# Patient Record
Sex: Female | Born: 1935 | Race: White | Hispanic: No | Marital: Married | Smoking: Former smoker
Health system: Southern US, Community
[De-identification: ages and names within clinical notes are randomized; demographics above are authoritative.]

## PROBLEM LIST (undated history)

## (undated) DIAGNOSIS — M1990 Unspecified osteoarthritis, unspecified site: Secondary | ICD-10-CM

## (undated) DIAGNOSIS — R51 Headache: Secondary | ICD-10-CM

## (undated) DIAGNOSIS — C189 Malignant neoplasm of colon, unspecified: Secondary | ICD-10-CM

## (undated) DIAGNOSIS — Z789 Other specified health status: Secondary | ICD-10-CM

## (undated) DIAGNOSIS — I10 Essential (primary) hypertension: Secondary | ICD-10-CM

## (undated) DIAGNOSIS — I6529 Occlusion and stenosis of unspecified carotid artery: Secondary | ICD-10-CM

## (undated) HISTORY — DX: Malignant neoplasm of colon, unspecified: C18.9

## (undated) HISTORY — PX: APPENDECTOMY: SHX54

## (undated) HISTORY — DX: Occlusion and stenosis of unspecified carotid artery: I65.29

## (undated) HISTORY — PX: FRACTURE SURGERY: SHX138

## (undated) HISTORY — DX: Essential (primary) hypertension: I10

## (undated) HISTORY — PX: TONSILLECTOMY: SUR1361

## (undated) HISTORY — PX: ABDOMINAL HYSTERECTOMY: SHX81

---

## 2005-05-20 HISTORY — PX: INTRAOCULAR LENS IMPLANT, SECONDARY: SHX1842

## 2013-08-25 ENCOUNTER — Emergency Department (HOSPITAL_COMMUNITY): Payer: Medicare Other

## 2013-08-25 ENCOUNTER — Encounter (HOSPITAL_COMMUNITY): Payer: Self-pay | Admitting: Emergency Medicine

## 2013-08-25 ENCOUNTER — Inpatient Hospital Stay (HOSPITAL_COMMUNITY)
Admission: EM | Admit: 2013-08-25 | Discharge: 2013-08-31 | DRG: 330 | Disposition: A | Discharge status: Home / Self Care | Payer: Medicare Other | Attending: General Surgery | Admitting: General Surgery

## 2013-08-25 DIAGNOSIS — D649 Anemia, unspecified: Secondary | ICD-10-CM | POA: Diagnosis not present

## 2013-08-25 DIAGNOSIS — R109 Unspecified abdominal pain: Secondary | ICD-10-CM | POA: Diagnosis not present

## 2013-08-25 DIAGNOSIS — K6389 Other specified diseases of intestine: Secondary | ICD-10-CM

## 2013-08-25 DIAGNOSIS — N390 Urinary tract infection, site not specified: Secondary | ICD-10-CM | POA: Diagnosis present

## 2013-08-25 DIAGNOSIS — C19 Malignant neoplasm of rectosigmoid junction: Secondary | ICD-10-CM | POA: Diagnosis not present

## 2013-08-25 DIAGNOSIS — D126 Benign neoplasm of colon, unspecified: Secondary | ICD-10-CM | POA: Diagnosis present

## 2013-08-25 DIAGNOSIS — R000 Tachycardia, unspecified: Secondary | ICD-10-CM | POA: Diagnosis present

## 2013-08-25 DIAGNOSIS — K56609 Unspecified intestinal obstruction, unspecified as to partial versus complete obstruction: Secondary | ICD-10-CM | POA: Diagnosis not present

## 2013-08-25 DIAGNOSIS — E860 Dehydration: Secondary | ICD-10-CM | POA: Diagnosis not present

## 2013-08-25 DIAGNOSIS — D490 Neoplasm of unspecified behavior of digestive system: Secondary | ICD-10-CM | POA: Diagnosis not present

## 2013-08-25 DIAGNOSIS — K639 Disease of intestine, unspecified: Secondary | ICD-10-CM | POA: Diagnosis present

## 2013-08-25 DIAGNOSIS — R1084 Generalized abdominal pain: Secondary | ICD-10-CM | POA: Diagnosis not present

## 2013-08-25 DIAGNOSIS — K5730 Diverticulosis of large intestine without perforation or abscess without bleeding: Secondary | ICD-10-CM | POA: Diagnosis present

## 2013-08-25 DIAGNOSIS — R222 Localized swelling, mass and lump, trunk: Secondary | ICD-10-CM | POA: Diagnosis not present

## 2013-08-25 HISTORY — DX: Other specified health status: Z78.9

## 2013-08-25 HISTORY — DX: Headache: R51

## 2013-08-25 HISTORY — DX: Unspecified osteoarthritis, unspecified site: M19.90

## 2013-08-25 LAB — COMPREHENSIVE METABOLIC PANEL
ALK PHOS: 112 U/L (ref 39–117)
ALT: 11 U/L (ref 0–35)
AST: 19 U/L (ref 0–37)
Albumin: 2.4 g/dL — ABNORMAL LOW (ref 3.5–5.2)
BILIRUBIN TOTAL: 0.3 mg/dL (ref 0.3–1.2)
BUN: 21 mg/dL (ref 6–23)
CO2: 28 meq/L (ref 19–32)
Calcium: 8.7 mg/dL (ref 8.4–10.5)
Chloride: 103 mEq/L (ref 96–112)
Creatinine, Ser: 0.99 mg/dL (ref 0.50–1.10)
GFR calc non Af Amer: 53 mL/min — ABNORMAL LOW (ref >90)
GFR, EST AFRICAN AMERICAN: 61 mL/min — AB (ref >90)
GLUCOSE: 120 mg/dL — AB (ref 70–99)
POTASSIUM: 4.9 meq/L (ref 3.7–5.3)
Sodium: 140 mEq/L (ref 137–147)
Total Protein: 7.1 g/dL (ref 6.0–8.3)

## 2013-08-25 LAB — PROTIME-INR
INR: 1 (ref 0.00–1.49)
PROTHROMBIN TIME: 13 s (ref 11.6–15.2)

## 2013-08-25 LAB — URINALYSIS, ROUTINE W REFLEX MICROSCOPIC
Glucose, UA: NEGATIVE mg/dL
Hgb urine dipstick: NEGATIVE
Leukocytes, UA: NEGATIVE
NITRITE: POSITIVE — AB
Protein, ur: 30 mg/dL — AB
Specific Gravity, Urine: >1.03 — ABNORMAL HIGH (ref 1.005–1.030)
Urobilinogen, UA: 1 mg/dL (ref 0.0–1.0)
pH: 6 (ref 5.0–8.0)

## 2013-08-25 LAB — CBC WITH DIFFERENTIAL/PLATELET
Basophils Absolute: 0 10*3/uL (ref 0.0–0.1)
Basophils Relative: 0 % (ref 0–1)
Eosinophils Absolute: 0 10*3/uL (ref 0.0–0.7)
Eosinophils Relative: 0 % (ref 0–5)
HCT: 35.1 % — ABNORMAL LOW (ref 36.0–46.0)
HEMOGLOBIN: 11.2 g/dL — AB (ref 12.0–15.0)
LYMPHS ABS: 1.3 10*3/uL (ref 0.7–4.0)
LYMPHS PCT: 16 % (ref 12–46)
MCH: 27.9 pg (ref 26.0–34.0)
MCHC: 31.9 g/dL (ref 30.0–36.0)
MCV: 87.3 fL (ref 78.0–100.0)
MONOS PCT: 10 % (ref 3–12)
Monocytes Absolute: 0.8 10*3/uL (ref 0.1–1.0)
NEUTROS ABS: 6.1 10*3/uL (ref 1.7–7.7)
NEUTROS PCT: 74 % (ref 43–77)
Platelets: 392 10*3/uL (ref 150–400)
RBC: 4.02 MIL/uL (ref 3.87–5.11)
RDW: 15.8 % — ABNORMAL HIGH (ref 11.5–15.5)
WBC: 8.1 10*3/uL (ref 4.0–10.5)

## 2013-08-25 LAB — URINE MICROSCOPIC-ADD ON

## 2013-08-25 LAB — LIPASE, BLOOD: Lipase: 18 U/L (ref 11–59)

## 2013-08-25 MED ORDER — ONDANSETRON HCL 4 MG/2ML IJ SOLN
4.0000 mg | Freq: Once | INTRAMUSCULAR | Status: AC
  Administered 2013-08-25: 4 mg via INTRAVENOUS
  Filled 2013-08-25: qty 2

## 2013-08-25 MED ORDER — ACETAMINOPHEN 325 MG PO TABS
650.0000 mg | ORAL_TABLET | Freq: Four times a day (QID) | ORAL | Status: DC | PRN
  Administered 2013-08-29 – 2013-08-30 (×3): 650 mg via ORAL
  Filled 2013-08-25 (×4): qty 2

## 2013-08-25 MED ORDER — SODIUM CHLORIDE 0.9 % IJ SOLN
INTRAMUSCULAR | Status: AC
  Filled 2013-08-25: qty 600

## 2013-08-25 MED ORDER — IOHEXOL 300 MG/ML  SOLN
50.0000 mL | Freq: Once | INTRAMUSCULAR | Status: AC | PRN
  Administered 2013-08-25: 50 mL via ORAL

## 2013-08-25 MED ORDER — HYDROMORPHONE HCL PF 1 MG/ML IJ SOLN: 1.0000 mg | INTRAMUSCULAR | Status: DC | PRN

## 2013-08-25 MED ORDER — ONDANSETRON HCL 4 MG/2ML IJ SOLN
4.0000 mg | Freq: Four times a day (QID) | INTRAMUSCULAR | Status: DC | PRN
  Administered 2013-08-26 – 2013-08-27 (×2): 4 mg via INTRAVENOUS
  Filled 2013-08-25 (×2): qty 2

## 2013-08-25 MED ORDER — SODIUM CHLORIDE 0.9 % IV SOLN: INTRAVENOUS | Status: DC

## 2013-08-25 MED ORDER — MORPHINE SULFATE 2 MG/ML IJ SOLN
2.0000 mg | Freq: Once | INTRAMUSCULAR | Status: AC
  Administered 2013-08-25: 2 mg via INTRAVENOUS
  Filled 2013-08-25: qty 1

## 2013-08-25 MED ORDER — ALUM & MAG HYDROXIDE-SIMETH 200-200-20 MG/5ML PO SUSP: 30.0000 mL | Freq: Four times a day (QID) | ORAL | Status: DC | PRN

## 2013-08-25 MED ORDER — SODIUM CHLORIDE 0.9 % IV SOLN
INTRAVENOUS | Status: AC
  Administered 2013-08-25: 14:00:00 via INTRAVENOUS

## 2013-08-25 MED ORDER — ACETAMINOPHEN 650 MG RE SUPP: 650.0000 mg | Freq: Four times a day (QID) | RECTAL | Status: DC | PRN

## 2013-08-25 MED ORDER — ONDANSETRON HCL 4 MG/2ML IJ SOLN: 4.0000 mg | Freq: Three times a day (TID) | INTRAMUSCULAR | Status: DC | PRN

## 2013-08-25 MED ORDER — PEG 3350-KCL-NA BICARB-NACL 420 G PO SOLR
ORAL | Status: AC
  Filled 2013-08-25: qty 4000

## 2013-08-25 MED ORDER — IOHEXOL 300 MG/ML  SOLN
100.0000 mL | Freq: Once | INTRAMUSCULAR | Status: AC | PRN
  Administered 2013-08-25: 100 mL via INTRAVENOUS

## 2013-08-25 MED ORDER — ENOXAPARIN SODIUM 40 MG/0.4ML ~~LOC~~ SOLN
40.0000 mg | SUBCUTANEOUS | Status: DC
  Administered 2013-08-25 – 2013-08-26 (×2): 40 mg via SUBCUTANEOUS
  Filled 2013-08-25 (×2): qty 0.4

## 2013-08-25 MED ORDER — PEG 3350-KCL-NABCB-NACL-NASULF 236 G PO SOLR
4000.0000 mL | Freq: Once | ORAL | Status: AC
  Administered 2013-08-26: 4000 mL via ORAL
  Filled 2013-08-25: qty 4000

## 2013-08-25 MED ORDER — MORPHINE SULFATE 2 MG/ML IJ SOLN
1.0000 mg | INTRAMUSCULAR | Status: DC | PRN
  Administered 2013-08-25 – 2013-08-26 (×2): 1 mg via INTRAVENOUS
  Filled 2013-08-25 (×2): qty 1

## 2013-08-25 MED ORDER — HYDROCODONE-ACETAMINOPHEN 5-325 MG PO TABS
1.0000 | ORAL_TABLET | ORAL | Status: DC | PRN
  Administered 2013-08-25: 1 via ORAL
  Administered 2013-08-26: 2 via ORAL
  Filled 2013-08-25: qty 1
  Filled 2013-08-25: qty 2

## 2013-08-25 MED ORDER — ONDANSETRON HCL 4 MG PO TABS: 4.0000 mg | ORAL_TABLET | Freq: Four times a day (QID) | ORAL | Status: DC | PRN

## 2013-08-25 NOTE — ED Notes (Signed)
Report given to [facility] unit 300.

## 2013-08-25 NOTE — H&P (Signed)
Patient seen and examined. Above note reviewed.  She is admitted to the hospital with intermittent diarrhea, nausea and vomiting. CT scan of the abdomen and pelvis indicates apocrine lesion in the colon with dilation of proximal colon and small bowel. The patient is moving her bowels. She's not having any vomiting at this time. She will undergo colonoscopy tomorrow with gastroenterology. She will also likely need a surgical consult. Inpatient versus outpatient oncology consult pending biopsy results  [facility]

## 2013-08-25 NOTE — ED Provider Notes (Signed)
CSN: [id number]     Arrival date & time 08/25/13  0932 History   First MD Initiated Contact with Patient 08/25/13 1009     Chief Complaint  Patient presents with  . Abdominal Pain     (Consider location/radiation/quality/duration/timing/severity/associated sxs/prior Treatment) Patient is a 80 y.o. female presenting with abdominal pain. The history is provided by the patient.  Abdominal Pain Pain location:  Generalized Pain quality: cramping   Pain radiates to:  Does not radiate Pain severity:  Severe Onset quality:  Sudden Duration:  12 hours Timing:  Constant Progression:  Worsening Chronicity:  New Context: not diet changes, not previous surgeries, not recent illness, not recent travel, not sick contacts and not suspicious food intake   Relieved by:  Nothing Worsened by:  Movement, position changes and eating Ineffective treatments:  None tried Associated symptoms: anorexia, chills, diarrhea, nausea and vomiting   Associated symptoms: no chest pain, no constipation, no cough, no dysuria and no fever    [name] [name] is a 80 y.o. female with history of hysterectomy and appendectomy who presents to the ED with generalized abdominal pain that started about 10:30 am yesterday. She describes the pain as severe and feels like when she had labor pains. She reports having had 3 similar episodes since December but not this bad. She did not want to come today but her husband insisted.  She had vomiting that started yesterday the same time as the pain started.she has had uncontrollable diarrhea this am. She does not have a PCP. The last time she saw a doctor was about 8 years ago. She has no medical problems. The patient's husband is with her and her daughter that works here in the ICU.   History reviewed. No pertinent past medical history. Past Surgical History  Procedure Laterality Date  . Abdominal hysterectomy    . Appendectomy     No family history on file. History  Substance Use Topics   . Smoking status: Never Smoker   . Smokeless tobacco: Not on file  . Alcohol Use: No   OB History   Grav Para Term Preterm Abortions TAB SAB Ect Mult Living                 Review of Systems  Constitutional: Positive for chills. Negative for fever.  HENT: Negative.   Eyes: Negative for pain and redness.  Respiratory: Negative for cough.   Cardiovascular: Negative for chest pain.  Gastrointestinal: Positive for nausea, vomiting, abdominal pain, diarrhea and anorexia. Negative for constipation.  Genitourinary: Negative for dysuria and urgency.  Skin: Negative for rash.  Neurological: Negative for light-headedness and headaches.  Psychiatric/Behavioral: Negative for confusion. The patient is not nervous/anxious.       Allergies  Review of patient's allergies indicates no known allergies.  Home Medications   Current Outpatient Rx  Name  Route  Sig  Dispense  Refill  . Aspirin-Salicylamide-Caffeine (BC HEADACHE POWDER PO)   Oral   Take 1 Package by mouth daily as needed (headache).         Vladimir Faster Glycol-Propyl Glycol (SYSTANE OP)   Ophthalmic   Apply 1 drop to eye 2 (two) times daily as needed (allergies).          BP 148/56  Pulse 104  Temp(Src) 97.6 F (36.4 C) (Oral)  Resp 20  Ht 5\' 3"  (1.6 m)  Wt 140 lb (63.504 kg)  BMI 24.81 kg/m2  SpO2 100% Physical Exam  Nursing note and vitals reviewed. Constitutional:  She is oriented to person, place, and time. She appears well-developed and well-nourished. No distress.  HENT:  Head: Normocephalic and atraumatic.  Eyes: Conjunctivae and EOM are normal.  Neck: Normal range of motion. Neck supple.  Cardiovascular: Regular rhythm.  Tachycardia present.   Pulmonary/Chest: Effort normal.  Abdominal: Soft. Bowel sounds are normal. There is generalized tenderness. There is no rebound and no guarding.  Musculoskeletal: Normal range of motion.  Neurological: She is alert and oriented to person, place, and time. No  cranial nerve deficit.  Skin: Skin is warm and dry.  Psychiatric: She has a normal mood and affect. Her behavior is normal.     Results for orders placed during the hospital encounter of 08/25/13 (from the past 24 hour(s))  CBC WITH DIFFERENTIAL     Status: Abnormal   Collection Time    08/25/13 10:12 AM      Result Value Ref Range   WBC 8.1  4.0 - 10.5 K/uL   RBC 4.02  3.87 - 5.11 MIL/uL   Hemoglobin 11.2 (*) 12.0 - 15.0 g/dL   HCT 35.1 (*) 36.0 - 46.0 %   MCV 87.3  78.0 - 100.0 fL   MCH 27.9  26.0 - 34.0 pg   MCHC 31.9  30.0 - 36.0 g/dL   RDW 15.8 (*) 11.5 - 15.5 %   Platelets 392  150 - 400 K/uL   Neutrophils Relative % 74  43 - 77 %   Neutro Abs 6.1  1.7 - 7.7 K/uL   Lymphocytes Relative 16  12 - 46 %   Lymphs Abs 1.3  0.7 - 4.0 K/uL   Monocytes Relative 10  3 - 12 %   Monocytes Absolute 0.8  0.1 - 1.0 K/uL   Eosinophils Relative 0  0 - 5 %   Eosinophils Absolute 0.0  0.0 - 0.7 K/uL   Basophils Relative 0  0 - 1 %   Basophils Absolute 0.0  0.0 - 0.1 K/uL  COMPREHENSIVE METABOLIC PANEL     Status: Abnormal   Collection Time    08/25/13 10:59 AM      Result Value Ref Range   Sodium 140  137 - 147 mEq/L   Potassium 4.9  3.7 - 5.3 mEq/L   Chloride 103  96 - 112 mEq/L   CO2 28  19 - 32 mEq/L   Glucose, Bld 120 (*) 70 - 99 mg/dL   BUN 21  6 - 23 mg/dL   Creatinine, Ser 0.99  0.50 - 1.10 mg/dL   Calcium 8.7  8.4 - 10.5 mg/dL   Total Protein 7.1  6.0 - 8.3 g/dL   Albumin 2.4 (*) 3.5 - 5.2 g/dL   AST 19  0 - 37 U/L   ALT 11  0 - 35 U/L   Alkaline Phosphatase 112  39 - 117 U/L   Total Bilirubin 0.3  0.3 - 1.2 mg/dL   GFR calc non Af Amer 53 (*) >90 mL/min   GFR calc Af Amer 61 (*) >90 mL/min  LIPASE, BLOOD     Status: None   Collection Time    08/25/13 10:59 AM      Result Value Ref Range   Lipase 18  11 - 59 U/L  URINALYSIS, ROUTINE W REFLEX MICROSCOPIC     Status: Abnormal   Collection Time    08/25/13 12:12 PM      Result Value Ref Range   Color, Urine YELLOW   YELLOW   APPearance  HAZY (*) CLEAR   Specific Gravity, Urine >1.030 (*) 1.005 - 1.030   pH 6.0  5.0 - 8.0   Glucose, UA NEGATIVE  NEGATIVE mg/dL   Hgb urine dipstick NEGATIVE  NEGATIVE   Bilirubin Urine SMALL (*) NEGATIVE   Ketones, ur TRACE (*) NEGATIVE mg/dL   Protein, ur 30 (*) NEGATIVE mg/dL   Urobilinogen, UA 1.0  0.0 - 1.0 mg/dL   Nitrite POSITIVE (*) NEGATIVE   Leukocytes, UA NEGATIVE  NEGATIVE  URINE MICROSCOPIC-ADD ON     Status: Abnormal   Collection Time    08/25/13 12:12 PM      Result Value Ref Range   Squamous Epithelial / LPF FEW (*) RARE   WBC, UA 3-6  <3 WBC/hpf   RBC / HPF 3-6  <3 RBC/hpf   Bacteria, UA MANY (*) RARE   Casts HYALINE CASTS (*) NEGATIVE   Ct Abdomen Pelvis W Contrast  08/25/2013   CLINICAL DATA:  Generalized abdominal pain  EXAM: CT ABDOMEN AND PELVIS WITH CONTRAST  TECHNIQUE: Multidetector CT imaging of the abdomen and pelvis was performed using the standard protocol following bolus administration of intravenous contrast.  CONTRAST:  42mL OMNIPAQUE IOHEXOL 300 MG/ML SOLN, 159mL OMNIPAQUE IOHEXOL 300 MG/ML SOLN  COMPARISON:  None.  FINDINGS: Lung bases are unremarkable. Mild dextroscoliosis lower thoracic and lumbar spine. Degenerative changes lumbar spine.  Enhanced liver is unremarkable. No calcified gallstones are noted within gallbladder.  The pancreas spleen and adrenal glands are unremarkable.  Kidneys are symmetrical in size and enhancement. Multiple large parapelvic cysts are noted right kidney the largest in midpole measures 3.1 cm. Parapelvic cysts are noted within left kidney the largest in lower pole measures 1.9 cm.  Delayed renal images shows bilateral renal symmetrical excretion. Bilateral visualized proximal ureter is unremarkable. Atherosclerotic calcifications of abdominal aorta and iliac arteries.  Mild distended distal small bowel loops with fluid. No any contrast material noted in distal small bowel loops or within colon. Findings highly  suspicious for partial small bowel obstruction or significant ileus.  There is distension of the proximal right colon with fluid. In axial image 43 there is apple core lesion in right colon inferior to hepatic flexure. This is highly suspicious for a malignant stricture. This is best visualized in coronal image 35. Further correlation with colonoscopy is recommended.  The terminal ileum is unremarkable.  The patient is status post appendectomy. Trace fluid is noted just inferior to the cecum see coronal image 30.  Sigmoid colon diverticula are noted without evidence of acute diverticulitis PE  The transverse colon, left colon and sigmoid colon are smaller caliber.  Atherosclerotic calcifications of abdominal aorta and iliac arteries. Atherosclerotic calcifications bilateral renal artery origin. No aortic aneurysm.  IMPRESSION: 1. There is apple core lesion in right colon measures about 2.6 x 2.2 cm. This is highly suspicious for malignant colonic stricture. Further evaluation with colonoscopy is recommended. Mild distended of right colon proximal to the stricture. There is distension of distal small bowel loops with fluid. Findings suspicious for significant ileus or partial small bowel obstruction. 2. No ascites or free air. 3. Atherosclerotic calcifications of abdominal aorta and iliac arteries. Atherosclerotic calcifications are noted bilateral renal artery origin. 4. No hydronephrosis or hydroureter. Bilateral large parapelvic cysts. These results were called by telephone at the time of interpretation on 08/25/2013 at 12:48 PM to Dr. Stark Jock , who verbally acknowledged these results.   Electronically Signed   By: Lahoma Crocker M.D.   On: 08/25/2013  12:50    ED Course  Procedures  IV normal saline, Zofran, Morphine MDM: Dr. Stark Jock spoke with the Hospital[facility] and he will admit.    80 y.o. female with nausea, vomiting and severe abdominal pain x 24 hours.  Will admit for further evaluation of the bowel lesion and  obstruction. Patient also has UTI.     [facility] Bunnie Pion, [place] 08/25/13 1356

## 2013-08-25 NOTE — ED Notes (Signed)
Generalized abdominal pain with vomiting began at 1030 yesterday morning. Diarrhea began this morning.

## 2013-08-25 NOTE — H&P (Signed)
[facility] History and Physical  Robby Pirani JOA:[id number] DOB: November 02, 1931 DOA: 08/25/2013  Referring physician:  PCP: No PCP Per Patient   Chief Complaint: Abdominal  HPI: [name] [name] is a very pleasant 79 y.o. female with no past medical history presents to the emergency department with the chief complaint of abdominal pain. Information is obtained from the patient. She reports intermittent abdominal pain for the last 4 months. She reports approximately 5 "episodes" in a four-month period. She indicates each episode lasted 3-4 days. Yesterday morning she developed sudden severe pain that started in her lower abdomen and radiated to to her left upper quadrant. She describes the pain as "like labor pains" associated symptoms include some diarrhea, nausea with vomiting. She denies any coffee ground emesis. He also experiences some abdominal distention. She indicates that she has suffered from constipation over the last several weeks as well. She tried to take Mylanta and prune juice with out relief. She indicates that when she eats or drinks the abdominal pain worsens. She states that  when she has an episode she just stops eating and drinking and that he would ultimately stopped. His workup in the emergency department yields a CT of the abdomen and pelvis that reveals an apple core lesion in her right: Suspicious for malignancy as well as ileus versus small bowel obstruction. In the emergency department she was given morphine with good relief. She is hemodynamically stable and not hypoxic. We are asked to admit  Review of Systems: 10 point review of systems completed and all systems are negative except as indicated in the history of present illness History reviewed. No pertinent past medical history. Past Surgical History  Procedure Laterality Date  . Abdominal hysterectomy    . Appendectomy     Social History:  reports that she has never smoked. She does not have any smokeless tobacco  history on file. She reports that she does not drink alcohol. Her drug history is not on file. 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 He does not have a PCP and has not gone to the doctor in many years No Known Allergies  No family history on file.   Prior to Admission medications   Medication Sig Start Date End Date Taking? Authorizing Provider  Aspirin-Salicylamide-Caffeine (BC HEADACHE POWDER PO) Take 1 Package by mouth daily as needed (headache).   Yes Historical Provider, MD  Polyethyl Glycol-Propyl Glycol (SYSTANE OP) Apply 1 drop to eye 2 (two) times daily as needed (allergies).   Yes Historical Provider, MD   Physical Exam: Filed Vitals:   08/25/13 1358  BP: 126/63  Pulse: 99  Temp: 97.9 F (36.6 C)  Resp: 18    BP 126/63  Pulse 99  Temp(Src) 97.9 F (36.6 C) (Oral)  Resp 18  Ht 5\' 3"  (1.6 m)  Wt 63.504 kg (140 lb)  BMI 24.81 kg/m2  SpO2 94%  General:  Appears calm and comfortable Eyes: PERRL, normal lids, irises & conjunctiva ENT: grossly normal hearing, lips & tongue Neck: no LAD, masses or thyromegaly Cardiovascular: RRR, no m/r/g. No LE edema. Telemetry: SR, no arrhythmias  Respiratory: CTA bilaterally, no w/r/r. Normal respiratory effort. Abdomen: Slightly distended but soft positive bowel sounds but sluggish mild tenderness to palpation in lower quadrants. Mild to moderate tenderness in the mid left quadrant. No guarding Skin: no rash or induration seen on limited exam Musculoskeletal: grossly normal tone BUE/BLE  Psychiatric: grossly normal mood and affect, speech fluent and appropriate Neurologic: grossly non-focal.          Labs on Admission:  Basic Metabolic Panel:  Recent Labs Lab 08/25/13 1059  NA 140  K 4.9  CL 103  CO2 28  GLUCOSE 120*  BUN 21  CREATININE 0.99  CALCIUM 8.7   Liver Function Tests:  Recent Labs Lab 08/25/13 1059  AST  19  ALT 11  ALKPHOS 112  BILITOT 0.3  PROT 7.1  ALBUMIN 2.4*    Recent Labs Lab 08/25/13 1059  LIPASE 18   No results found for this basename: AMMONIA,  in the last 168 hours CBC:  Recent Labs Lab 08/25/13 1012  WBC 8.1  NEUTROABS 6.1  HGB 11.2*  HCT 35.1*  MCV 87.3  PLT 392   Cardiac Enzymes: No results found for this basename: CKTOTAL, CKMB, CKMBINDEX, TROPONINI,  in the last 168 hours  BNP (last 3 results) No results found for this basename: PROBNP,  in the last 8760 hours CBG: No results found for this basename: GLUCAP,  in the last 168 hours  Radiological Exams on Admission: Ct Abdomen Pelvis W Contrast  08/25/2013   CLINICAL DATA:  Generalized abdominal pain  EXAM: CT ABDOMEN AND PELVIS WITH CONTRAST  TECHNIQUE: Multidetector CT imaging of the abdomen and pelvis was performed using the standard protocol following bolus administration of intravenous contrast.  CONTRAST:  2mL OMNIPAQUE IOHEXOL 300 MG/ML SOLN, 131mL OMNIPAQUE IOHEXOL 300 MG/ML SOLN  COMPARISON:  None.  FINDINGS: Lung bases are unremarkable. Mild dextroscoliosis lower thoracic and lumbar spine. Degenerative changes lumbar spine.  Enhanced liver is unremarkable. No calcified gallstones are noted within gallbladder.  The pancreas spleen and adrenal glands are unremarkable.  Kidneys are symmetrical in size and enhancement. Multiple large parapelvic cysts are noted right kidney the largest in midpole measures 3.1 cm. Parapelvic cysts are noted within left kidney the largest in lower pole measures 1.9 cm.  Delayed renal images shows bilateral renal symmetrical excretion. Bilateral visualized proximal ureter is unremarkable. Atherosclerotic calcifications of abdominal aorta and iliac arteries.  Mild distended distal small bowel loops with fluid. No any contrast material noted in distal small bowel loops or within colon. Findings highly suspicious for partial small bowel obstruction or significant ileus.  There is  distension of the proximal right colon with fluid. In axial image 43 there is apple core lesion in right colon inferior to hepatic flexure. This is highly suspicious for a malignant stricture. This is best visualized in coronal image 35. Further correlation with colonoscopy is recommended.  The terminal ileum is unremarkable.  The patient is status post appendectomy. Trace fluid is noted just inferior to the cecum see coronal image 30.  Sigmoid colon diverticula are noted without evidence of acute diverticulitis PE  The transverse colon, left colon and sigmoid colon are smaller caliber.  Atherosclerotic calcifications of abdominal aorta and iliac arteries. Atherosclerotic calcifications bilateral renal artery origin. No aortic aneurysm.  IMPRESSION: 1. There is apple core lesion in right colon measures about 2.6 x 2.2 cm. This is highly suspicious for malignant colonic stricture. Further evaluation with colonoscopy is recommended. Mild distended of right colon proximal to the stricture. There is distension of distal small bowel loops with fluid. Findings suspicious for significant ileus or partial small bowel obstruction. 2. No ascites or free air. 3. Atherosclerotic calcifications of abdominal aorta and iliac arteries. Atherosclerotic calcifications are noted bilateral renal artery origin. 4. No hydronephrosis or hydroureter. Bilateral  large parapelvic cysts. These results were called by telephone at the time of interpretation on 08/25/2013 at 12:48 PM to Dr. Stark Jock , who verbally acknowledged these results.   Electronically Signed   By: Lahoma Crocker M.D.   On: 08/25/2013 12:50    EKG:   Assessment/Plan Principal Problem:   Small bowel obstruction: CT scan with distention of small bowel loop suspicious for ileus versus partial small bowel obstruction. Likely related to lesion. Will admit to medical floor. Will provide clear liquids for now. Also provide pain medicine meds and Zofran for any nausea. Will request a  surgical consult.  Active Problems:  Lesion of colon: CT with apple core lesion right colon. Concerning for malignancy. For request GI consult as patient will likely need colonoscopy for further evaluation. Provide clear liquids pain medicine and antimanic as indicated.    Tachycardia: Likely related to abdominal pain and mild dehydration as patient has had nothing to eat or drink in the last couple of days. Some improvement with pain medicine. Will also provide gentle IV fluids. Monitor closely     Anemia: Mild. Will monitor  Abdominal pain: Do to #1 and possibly #2. See treatments as above. Pain medicine as indicate  Nausea/vomiting. Patient reports no appetite at this time period but requesting clear liquids. Has not had by mouth intake over the last 2 days. Will provide antimanic as indicated. Of note she was able to tolerate 2 bottles of contrast for her CT without vomiting. Continue Zofran  Gastroenterology General   Code Status: full Family Communication: Husband and daughter at bedside. Of note daughter is an Therapist, sports in the ICU here at the hospital Disposition Plan: home when ready  Time spent: 60 minutes  [facility] Pager [phone]

## 2013-08-25 NOTE — ED Provider Notes (Signed)
Medical screening examination/treatment/procedure(s) were conducted as a shared visit with non-physician practitioner(s) and myself.  I personally evaluated the patient during the encounter. Patient is a 80 year old female with no significant past medical history. She presents today with complaints of intermittent abdominal pain that has occurred over the past 3 months. She developed this abdominal pain again along with vomiting this morning and presents for evaluation of this. She has not seen a doctor in 8 years and states that she has not had a colonoscopy in nearly 40 years.  On exam vitals are stable and she is afebrile. Head is atraumatic normocephalic. Neck is supple. Heart regular rate and rhythm and lungs are clear. Abdomen reveals tenderness to palpation in the right upper and left upper quadrants. There is no rebound or guarding. Bowel sounds are normal active. Extremities are without edema.  Workup reveals no elevation of white count and electrolytes and lipase are unremarkable. Due to the degree of her discomfort, a CT scan of the abdomen and pelvis was obtained which revealed what appears to be an apple core lesion in the right colon with proximal obstruction. She will be admitted to the internal medicine service will consult both general surgery and gastroenterology to discuss the appropriate further management.     Veryl Speak, MD 08/25/13 [phone]

## 2013-08-25 NOTE — Consult Note (Signed)
Reason for Consult:abdominal pain Referring Physician: Hospitalist  [name] [name] is an 80 y.o. female.  HPI: Admitted thru the ED today with c/o abdominal pain. She had had abdominal pain x 1 day. Rated the pain 10/10. She has had 2 prior instances of this abdominal pain in December and January.    She does have  new complaint of constipation. Sometimes now she will have a BM every 3 days. Previously she would have a BM every day. Today she has had 3 loose stools. She denies melena or bright red rectal bleeding. No family hx of colon cancer. Appetite is good. No weight loss.  Only medication is BC  Powder for occasional headache.  NO PCP for over 40 yrs. She reports she had a colonoscopy 40 yrs ago    History reviewed. No pertinent past medical history.  Past Surgical History  Procedure Laterality Date  . Abdominal hysterectomy    . Appendectomy      No family history on file.  Social History:  reports that she has never smoked. She does not have any smokeless tobacco history on file. She reports that she does not drink alcohol. Her drug history is not on file.  Allergies: No Known Allergies  Medications: I have reviewed the patient's current medications.  Results for orders placed during the hospital encounter of 08/25/13 (from the past 48 hour(s))  CBC WITH DIFFERENTIAL     Status: Abnormal   Collection Time    08/25/13 10:12 AM      Result Value Ref Range   WBC 8.1  4.0 - 10.5 K/uL   RBC 4.02  3.87 - 5.11 MIL/uL   Hemoglobin 11.2 (*) 12.0 - 15.0 g/dL   HCT 35.1 (*) 36.0 - 46.0 %   MCV 87.3  78.0 - 100.0 fL   MCH 27.9  26.0 - 34.0 pg   MCHC 31.9  30.0 - 36.0 g/dL   RDW 15.8 (*) 11.5 - 15.5 %   Platelets 392  150 - 400 K/uL   Neutrophils Relative % 74  43 - 77 %   Neutro Abs 6.1  1.7 - 7.7 K/uL   Lymphocytes Relative 16  12 - 46 %   Lymphs Abs 1.3  0.7 - 4.0 K/uL   Monocytes Relative 10  3 - 12 %   Monocytes Absolute 0.8  0.1 - 1.0 K/uL   Eosinophils Relative 0  0 - 5  %   Eosinophils Absolute 0.0  0.0 - 0.7 K/uL   Basophils Relative 0  0 - 1 %   Basophils Absolute 0.0  0.0 - 0.1 K/uL  COMPREHENSIVE METABOLIC PANEL     Status: Abnormal   Collection Time    08/25/13 10:59 AM      Result Value Ref Range   Sodium 140  137 - 147 mEq/L   Potassium 4.9  3.7 - 5.3 mEq/L   Chloride 103  96 - 112 mEq/L   CO2 28  19 - 32 mEq/L   Glucose, Bld 120 (*) 70 - 99 mg/dL   BUN 21  6 - 23 mg/dL   Creatinine, Ser 0.99  0.50 - 1.10 mg/dL   Calcium 8.7  8.4 - 10.5 mg/dL   Total Protein 7.1  6.0 - 8.3 g/dL   Albumin 2.4 (*) 3.5 - 5.2 g/dL   AST 19  0 - 37 U/L   ALT 11  0 - 35 U/L   Alkaline Phosphatase 112  39 - 117 U/L  Total Bilirubin 0.3  0.3 - 1.2 mg/dL   GFR calc non Af Amer 53 (*) >90 mL/min   GFR calc Af Amer 61 (*) >90 mL/min   Comment: (NOTE)     The eGFR has been calculated using the CKD EPI equation.     This calculation has not been validated in all clinical situations.     eGFR's persistently <90 mL/min signify possible Chronic Kidney     Disease.  LIPASE, BLOOD     Status: None   Collection Time    08/25/13 10:59 AM      Result Value Ref Range   Lipase 18  11 - 59 U/L  PROTIME-INR     Status: None   Collection Time    08/25/13 10:59 AM      Result Value Ref Range   Prothrombin Time 13.0  11.6 - 15.2 seconds   INR 1.00  0.00 - 1.49  URINALYSIS, ROUTINE W REFLEX MICROSCOPIC     Status: Abnormal   Collection Time    08/25/13 12:12 PM      Result Value Ref Range   Color, Urine YELLOW  YELLOW   APPearance HAZY (*) CLEAR   Specific Gravity, Urine >1.030 (*) 1.005 - 1.030   pH 6.0  5.0 - 8.0   Glucose, UA NEGATIVE  NEGATIVE mg/dL   Hgb urine dipstick NEGATIVE  NEGATIVE   Bilirubin Urine SMALL (*) NEGATIVE   Ketones, ur TRACE (*) NEGATIVE mg/dL   Protein, ur 30 (*) NEGATIVE mg/dL   Urobilinogen, UA 1.0  0.0 - 1.0 mg/dL   Nitrite POSITIVE (*) NEGATIVE   Leukocytes, UA NEGATIVE  NEGATIVE  URINE MICROSCOPIC-ADD ON     Status: Abnormal    Collection Time    08/25/13 12:12 PM      Result Value Ref Range   Squamous Epithelial / LPF FEW (*) RARE   WBC, UA 3-6  <3 WBC/hpf   RBC / HPF 3-6  <3 RBC/hpf   Bacteria, UA MANY (*) RARE   Casts HYALINE CASTS (*) NEGATIVE   Comment: GRANULAR CAST    Ct Abdomen Pelvis W Contrast  08/25/2013   CLINICAL DATA:  Generalized abdominal pain  EXAM: CT ABDOMEN AND PELVIS WITH CONTRAST  TECHNIQUE: Multidetector CT imaging of the abdomen and pelvis was performed using the standard protocol following bolus administration of intravenous contrast.  CONTRAST:  91m OMNIPAQUE IOHEXOL 300 MG/ML SOLN, 1023mOMNIPAQUE IOHEXOL 300 MG/ML SOLN  COMPARISON:  None.  FINDINGS: Lung bases are unremarkable. Mild dextroscoliosis lower thoracic and lumbar spine. Degenerative changes lumbar spine.  Enhanced liver is unremarkable. No calcified gallstones are noted within gallbladder.  The pancreas spleen and adrenal glands are unremarkable.  Kidneys are symmetrical in size and enhancement. Multiple large parapelvic cysts are noted right kidney the largest in midpole measures 3.1 cm. Parapelvic cysts are noted within left kidney the largest in lower pole measures 1.9 cm.  Delayed renal images shows bilateral renal symmetrical excretion. Bilateral visualized proximal ureter is unremarkable. Atherosclerotic calcifications of abdominal aorta and iliac arteries.  Mild distended distal small bowel loops with fluid. No any contrast material noted in distal small bowel loops or within colon. Findings highly suspicious for partial small bowel obstruction or significant ileus.  There is distension of the proximal right colon with fluid. In axial image 43 there is apple core lesion in right colon inferior to hepatic flexure. This is highly suspicious for a malignant stricture. This is best visualized in coronal image  35. Further correlation with colonoscopy is recommended.  The terminal ileum is unremarkable.  The patient is status post  appendectomy. Trace fluid is noted just inferior to the cecum see coronal image 30.  Sigmoid colon diverticula are noted without evidence of acute diverticulitis PE  The transverse colon, left colon and sigmoid colon are smaller caliber.  Atherosclerotic calcifications of abdominal aorta and iliac arteries. Atherosclerotic calcifications bilateral renal artery origin. No aortic aneurysm.  IMPRESSION: 1. There is apple core lesion in right colon measures about 2.6 x 2.2 cm. This is highly suspicious for malignant colonic stricture. Further evaluation with colonoscopy is recommended. Mild distended of right colon proximal to the stricture. There is distension of distal small bowel loops with fluid. Findings suspicious for significant ileus or partial small bowel obstruction. 2. No ascites or free air. 3. Atherosclerotic calcifications of abdominal aorta and iliac arteries. Atherosclerotic calcifications are noted bilateral renal artery origin. 4. No hydronephrosis or hydroureter. Bilateral large parapelvic cysts. These results were called by telephone at the time of interpretation on 08/25/2013 at 12:48 PM to Dr. Stark Jock , who verbally acknowledged these results.   Electronically Signed   By: Lahoma Crocker M.D.   On: 08/25/2013 12:50    ROS Blood pressure 139/70, pulse 91, temperature 97.9 F (36.6 C), temperature source Oral, resp. rate 18, height '5\' 3"'  (1.6 m), weight 142 lb 6.7 oz (64.6 kg), SpO2 99.00%. Physical Exam Alert, Skin warm and dry. Lungs are clear. Heart slightly irregular. Abdomen is full. BS+. There is tenderness on left and rt abdomen. She has rebound on the left.   No masses felt. No edema to lower extremities.  Assessment/Plan:Lesion in rt colon. Suspicious for malignant colonic stricture. Will plan for colonoscopy.  Terri L Setzer 08/25/2013, 4:07 PM    GI attending note; Patient interviewed and examined. Imaging studies reviewed with patient's daughter and other family members. Patient  symptoms complex appears to be secondary to intermittent intestinal obstruction secondary to the ascending colon lesion suspicious for colon carcinoma. If she is able to tolerate clear liquids she will be prepped tomorrow for colonoscopy to be performed in the afternoon. Procedure and risks reviewed with the patient and family members and they're all agreeable

## 2013-08-26 ENCOUNTER — Encounter (HOSPITAL_COMMUNITY): Payer: Self-pay | Admitting: *Deleted

## 2013-08-26 ENCOUNTER — Inpatient Hospital Stay (HOSPITAL_COMMUNITY): Payer: Medicare Other

## 2013-08-26 ENCOUNTER — Encounter (HOSPITAL_COMMUNITY)
Admission: EM | Disposition: A | Discharge status: Home / Self Care | Payer: Self-pay | Source: Home / Self Care | Attending: General Surgery

## 2013-08-26 DIAGNOSIS — D126 Benign neoplasm of colon, unspecified: Secondary | ICD-10-CM

## 2013-08-26 DIAGNOSIS — D490 Neoplasm of unspecified behavior of digestive system: Secondary | ICD-10-CM | POA: Diagnosis not present

## 2013-08-26 DIAGNOSIS — K6389 Other specified diseases of intestine: Secondary | ICD-10-CM

## 2013-08-26 DIAGNOSIS — K56609 Unspecified intestinal obstruction, unspecified as to partial versus complete obstruction: Secondary | ICD-10-CM | POA: Diagnosis not present

## 2013-08-26 DIAGNOSIS — D649 Anemia, unspecified: Secondary | ICD-10-CM

## 2013-08-26 DIAGNOSIS — R222 Localized swelling, mass and lump, trunk: Secondary | ICD-10-CM | POA: Diagnosis not present

## 2013-08-26 DIAGNOSIS — E860 Dehydration: Secondary | ICD-10-CM | POA: Diagnosis not present

## 2013-08-26 DIAGNOSIS — N390 Urinary tract infection, site not specified: Secondary | ICD-10-CM | POA: Diagnosis not present

## 2013-08-26 HISTORY — PX: COLONOSCOPY: SHX5424

## 2013-08-26 LAB — CEA: CEA: 0.5 ng/mL (ref 0.0–5.0)

## 2013-08-26 LAB — PREPARE RBC (CROSSMATCH)

## 2013-08-26 LAB — CBC
HCT: 28.5 % — ABNORMAL LOW (ref 36.0–46.0)
HEMOGLOBIN: 8.9 g/dL — AB (ref 12.0–15.0)
MCH: 27.8 pg (ref 26.0–34.0)
MCHC: 31.2 g/dL (ref 30.0–36.0)
MCV: 89.1 fL (ref 78.0–100.0)
Platelets: 299 10*3/uL (ref 150–400)
RBC: 3.2 MIL/uL — AB (ref 3.87–5.11)
RDW: 15.6 % — ABNORMAL HIGH (ref 11.5–15.5)
WBC: 5.3 10*3/uL (ref 4.0–10.5)

## 2013-08-26 LAB — ABO/RH: ABO/RH(D): O POS

## 2013-08-26 SURGERY — COLONOSCOPY
Anesthesia: Moderate Sedation

## 2013-08-26 MED ORDER — MIDAZOLAM HCL 5 MG/5ML IJ SOLN
INTRAMUSCULAR | Status: DC | PRN
  Administered 2013-08-26 (×2): 1 mg via INTRAVENOUS
  Administered 2013-08-26 (×2): 2 mg via INTRAVENOUS

## 2013-08-26 MED ORDER — MIDAZOLAM HCL 5 MG/5ML IJ SOLN
INTRAMUSCULAR | Status: AC
Start: 2013-08-26 — End: 2013-08-27
  Filled 2013-08-26: qty 10

## 2013-08-26 MED ORDER — CHLORHEXIDINE GLUCONATE 4 % EX LIQD: 1.0000 "application " | Freq: Once | CUTANEOUS | Status: DC

## 2013-08-26 MED ORDER — ALVIMOPAN 12 MG PO CAPS
12.0000 mg | ORAL_CAPSULE | Freq: Once | ORAL | Status: DC
  Administered 2013-08-27: 12 mg via ORAL

## 2013-08-26 MED ORDER — ALPRAZOLAM 0.5 MG PO TABS
0.5000 mg | ORAL_TABLET | Freq: Every evening | ORAL | Status: DC | PRN
Start: 2013-08-26 — End: 2013-08-31
  Administered 2013-08-26: 0.5 mg via ORAL
  Filled 2013-08-26: qty 1

## 2013-08-26 MED ORDER — SODIUM CHLORIDE 0.9 % IV SOLN
INTRAVENOUS | Status: DC
  Administered 2013-08-26: 14:00:00 via INTRAVENOUS

## 2013-08-26 MED ORDER — ZOLPIDEM TARTRATE 5 MG PO TABS
5.0000 mg | ORAL_TABLET | Freq: Every evening | ORAL | Status: DC | PRN
Start: 2013-08-26 — End: 2013-08-26

## 2013-08-26 MED ORDER — MEPERIDINE HCL 50 MG/ML IJ SOLN
INTRAMUSCULAR | Status: AC
  Filled 2013-08-26: qty 1

## 2013-08-26 MED ORDER — MEPERIDINE HCL 50 MG/ML IJ SOLN
INTRAMUSCULAR | Status: DC | PRN
  Administered 2013-08-26 (×2): 25 mg via INTRAVENOUS

## 2013-08-26 MED ORDER — MORPHINE SULFATE 2 MG/ML IJ SOLN
2.0000 mg | INTRAMUSCULAR | Status: DC | PRN
  Administered 2013-08-26: 2 mg via INTRAVENOUS
  Filled 2013-08-26 (×2): qty 1

## 2013-08-26 MED ORDER — SODIUM CHLORIDE 0.9 % IV SOLN: 1.0000 g | INTRAVENOUS | Status: DC

## 2013-08-26 NOTE — Progress Notes (Signed)
Patient seen and examined. Above note reviewed.  Case was discussed with Dr. Melony Overly after colonoscopy. Also discussed case with Dr. Arnoldo Morale. Plans are for partial colectomy to resect colonic lesion tomorrow. Endoscopically the lesion appeared to be consistent with adenocarcinoma. We'll request oncology input while patient is in the hospital.  Her anemia is likely dilutional superimposed on anemia of chronic disease.  [employer]

## 2013-08-26 NOTE — Progress Notes (Signed)
Patient ID: [name] [name], female   DOB: July 20, 1931, 80 y.o.   MRN: [id number] Sitting at side of bed drink prep. Tolerating prep. No vomiting. Dr. Arnoldo Morale in with patient. Has had a drop in H and H . Patient has been T and C. Filed Vitals:   08/25/13 1358 08/25/13 1548 08/25/13 2019 08/26/13 0507  BP: 126/63 139/70 117/72 117/73  Pulse: 99 91 89 82  Temp: 97.9 F (36.6 C) 97.9 F (36.6 C) 98.1 F (36.7 C) 98.2 F (36.8 C)  TempSrc: Oral Oral Oral Oral  Resp: 18  18 18   Height:  5\' 3"  (1.6 m)    Weight:  142 lb 6.7 oz (64.6 kg)    SpO2: 94% 99% 97% 96%   CBC    Component Value Date/Time   WBC 5.3 08/26/2013 0456   RBC 3.20* 08/26/2013 0456   HGB 8.9* 08/26/2013 0456   HCT 28.5* 08/26/2013 0456   PLT 299 08/26/2013 0456   MCV 89.1 08/26/2013 0456   MCH 27.8 08/26/2013 0456   MCHC 31.2 08/26/2013 0456   RDW 15.6* 08/26/2013 0456   LYMPHSABS 1.3 08/25/2013 1012   MONOABS 0.8 08/25/2013 1012   EOSABS 0.0 08/25/2013 1012   BASOSABS 0.0 08/25/2013 1012   Assessment/Plan: Rt colonic lesion. Colonoscopy today.

## 2013-08-26 NOTE — Consult Note (Signed)
Reason for Consult:  Right colon mass Referring Physician: Hospitalist  [name] [name] is an 80 y.o. female.  HPI: Patient is a 80 year old white female who is her usual state of health when she began experiencing abdominal pain and distention. She states that her bowel movements have been less frequent over the last 3 months. CT scan the abdomen pelvis was performed which revealed a near obstructing right colon mass. She is to undergo a colonoscopy today by Dr. Laural Golden. She denies any blood per rectum. She does not have a primary care physician and she states she has been healthy.  History reviewed. No pertinent past medical history.  Past Surgical History  Procedure Laterality Date  . Abdominal hysterectomy    . Appendectomy      No family history on file.  Social History:  reports that she has never smoked. She does not have any smokeless tobacco history on file. She reports that she does not drink alcohol. Her drug history is not on file.  Allergies: No Known Allergies  Medications: I have reviewed the patient's current medications.  Results for orders placed during the hospital encounter of 08/25/13 (from the past 48 hour(s))  CBC WITH DIFFERENTIAL     Status: Abnormal   Collection Time    08/25/13 10:12 AM      Result Value Ref Range   WBC 8.1  4.0 - 10.5 K/uL   RBC 4.02  3.87 - 5.11 MIL/uL   Hemoglobin 11.2 (*) 12.0 - 15.0 g/dL   HCT 35.1 (*) 36.0 - 46.0 %   MCV 87.3  78.0 - 100.0 fL   MCH 27.9  26.0 - 34.0 pg   MCHC 31.9  30.0 - 36.0 g/dL   RDW 15.8 (*) 11.5 - 15.5 %   Platelets 392  150 - 400 K/uL   Neutrophils Relative % 74  43 - 77 %   Neutro Abs 6.1  1.7 - 7.7 K/uL   Lymphocytes Relative 16  12 - 46 %   Lymphs Abs 1.3  0.7 - 4.0 K/uL   Monocytes Relative 10  3 - 12 %   Monocytes Absolute 0.8  0.1 - 1.0 K/uL   Eosinophils Relative 0  0 - 5 %   Eosinophils Absolute 0.0  0.0 - 0.7 K/uL   Basophils Relative 0  0 - 1 %   Basophils Absolute 0.0  0.0 - 0.1 K/uL   COMPREHENSIVE METABOLIC PANEL     Status: Abnormal   Collection Time    08/25/13 10:59 AM      Result Value Ref Range   Sodium 140  137 - 147 mEq/L   Potassium 4.9  3.7 - 5.3 mEq/L   Chloride 103  96 - 112 mEq/L   CO2 28  19 - 32 mEq/L   Glucose, Bld 120 (*) 70 - 99 mg/dL   BUN 21  6 - 23 mg/dL   Creatinine, Ser 0.99  0.50 - 1.10 mg/dL   Calcium 8.7  8.4 - 10.5 mg/dL   Total Protein 7.1  6.0 - 8.3 g/dL   Albumin 2.4 (*) 3.5 - 5.2 g/dL   AST 19  0 - 37 U/L   ALT 11  0 - 35 U/L   Alkaline Phosphatase 112  39 - 117 U/L   Total Bilirubin 0.3  0.3 - 1.2 mg/dL   GFR calc non Af Amer 53 (*) >90 mL/min   GFR calc Af Amer 61 (*) >90 mL/min   Comment: (NOTE)  The eGFR has been calculated using the CKD EPI equation.     This calculation has not been validated in all clinical situations.     eGFR's persistently <90 mL/min signify possible Chronic Kidney     Disease.  LIPASE, BLOOD     Status: None   Collection Time    08/25/13 10:59 AM      Result Value Ref Range   Lipase 18  11 - 59 U/L  PROTIME-INR     Status: None   Collection Time    08/25/13 10:59 AM      Result Value Ref Range   Prothrombin Time 13.0  11.6 - 15.2 seconds   INR 1.00  0.00 - 1.49  URINALYSIS, ROUTINE W REFLEX MICROSCOPIC     Status: Abnormal   Collection Time    08/25/13 12:12 PM      Result Value Ref Range   Color, Urine YELLOW  YELLOW   APPearance HAZY (*) CLEAR   Specific Gravity, Urine >1.030 (*) 1.005 - 1.030   pH 6.0  5.0 - 8.0   Glucose, UA NEGATIVE  NEGATIVE mg/dL   Hgb urine dipstick NEGATIVE  NEGATIVE   Bilirubin Urine SMALL (*) NEGATIVE   Ketones, ur TRACE (*) NEGATIVE mg/dL   Protein, ur 30 (*) NEGATIVE mg/dL   Urobilinogen, UA 1.0  0.0 - 1.0 mg/dL   Nitrite POSITIVE (*) NEGATIVE   Leukocytes, UA NEGATIVE  NEGATIVE  URINE MICROSCOPIC-ADD ON     Status: Abnormal   Collection Time    08/25/13 12:12 PM      Result Value Ref Range   Squamous Epithelial / LPF FEW (*) RARE   WBC, UA 3-6  <3  WBC/hpf   RBC / HPF 3-6  <3 RBC/hpf   Bacteria, UA MANY (*) RARE   Casts HYALINE CASTS (*) NEGATIVE   Comment: GRANULAR CAST  CBC     Status: Abnormal   Collection Time    08/26/13  4:56 AM      Result Value Ref Range   WBC 5.3  4.0 - 10.5 K/uL   RBC 3.20 (*) 3.87 - 5.11 MIL/uL   Hemoglobin 8.9 (*) 12.0 - 15.0 g/dL   Comment: DELTA CHECK NOTED     RESULT REPEATED AND VERIFIED   HCT 28.5 (*) 36.0 - 46.0 %   MCV 89.1  78.0 - 100.0 fL   MCH 27.8  26.0 - 34.0 pg   MCHC 31.2  30.0 - 36.0 g/dL   RDW 15.6 (*) 11.5 - 15.5 %   Platelets 299  150 - 400 K/uL   Comment: DELTA CHECK NOTED  PREPARE RBC (CROSSMATCH)     Status: None   Collection Time    08/26/13  4:56 AM      Result Value Ref Range   Order Confirmation ORDER PROCESSED BY BLOOD BANK    TYPE AND SCREEN     Status: None   Collection Time    08/26/13  4:56 AM      Result Value Ref Range   ABO/RH(D) O POS     Antibody Screen NEG     Sample Expiration 08/29/2013     Unit Number [id number]     Blood Component Type RED CELLS,LR     Unit division 00     Status of Unit ALLOCATED     Transfusion Status OK TO TRANSFUSE     Crossmatch Result Compatible     Unit Number [id number]     Blood  Component Type RBC LR PHER1     Unit division 00     Status of Unit ALLOCATED     Transfusion Status OK TO TRANSFUSE     Crossmatch Result Compatible    ABO/RH     Status: None   Collection Time    08/26/13  5:00 AM      Result Value Ref Range   ABO/RH(D) O POS      Ct Abdomen Pelvis W Contrast  08/25/2013   CLINICAL DATA:  Generalized abdominal pain  EXAM: CT ABDOMEN AND PELVIS WITH CONTRAST  TECHNIQUE: Multidetector CT imaging of the abdomen and pelvis was performed using the standard protocol following bolus administration of intravenous contrast.  CONTRAST:  28m OMNIPAQUE IOHEXOL 300 MG/ML SOLN, 1033mOMNIPAQUE IOHEXOL 300 MG/ML SOLN  COMPARISON:  None.  FINDINGS: Lung bases are unremarkable. Mild dextroscoliosis lower thoracic  and lumbar spine. Degenerative changes lumbar spine.  Enhanced liver is unremarkable. No calcified gallstones are noted within gallbladder.  The pancreas spleen and adrenal glands are unremarkable.  Kidneys are symmetrical in size and enhancement. Multiple large parapelvic cysts are noted right kidney the largest in midpole measures 3.1 cm. Parapelvic cysts are noted within left kidney the largest in lower pole measures 1.9 cm.  Delayed renal images shows bilateral renal symmetrical excretion. Bilateral visualized proximal ureter is unremarkable. Atherosclerotic calcifications of abdominal aorta and iliac arteries.  Mild distended distal small bowel loops with fluid. No any contrast material noted in distal small bowel loops or within colon. Findings highly suspicious for partial small bowel obstruction or significant ileus.  There is distension of the proximal right colon with fluid. In axial image 43 there is apple core lesion in right colon inferior to hepatic flexure. This is highly suspicious for a malignant stricture. This is best visualized in coronal image 35. Further correlation with colonoscopy is recommended.  The terminal ileum is unremarkable.  The patient is status post appendectomy. Trace fluid is noted just inferior to the cecum see coronal image 30.  Sigmoid colon diverticula are noted without evidence of acute diverticulitis PE  The transverse colon, left colon and sigmoid colon are smaller caliber.  Atherosclerotic calcifications of abdominal aorta and iliac arteries. Atherosclerotic calcifications bilateral renal artery origin. No aortic aneurysm.  IMPRESSION: 1. There is apple core lesion in right colon measures about 2.6 x 2.2 cm. This is highly suspicious for malignant colonic stricture. Further evaluation with colonoscopy is recommended. Mild distended of right colon proximal to the stricture. There is distension of distal small bowel loops with fluid. Findings suspicious for significant  ileus or partial small bowel obstruction. 2. No ascites or free air. 3. Atherosclerotic calcifications of abdominal aorta and iliac arteries. Atherosclerotic calcifications are noted bilateral renal artery origin. 4. No hydronephrosis or hydroureter. Bilateral large parapelvic cysts. These results were called by telephone at the time of interpretation on 08/25/2013 at 12:48 PM to Dr. DeStark Jock who verbally acknowledged these results.   Electronically Signed   By: LiLahoma Crocker.D.   On: 08/25/2013 12:50    ROS: See chart Blood pressure 117/73, pulse 82, temperature 98.2 F (36.8 C), temperature source Oral, resp. rate 18, height '5\' 3"'  (1.6 m), weight 64.6 kg (142 lb 6.7 oz), SpO2 96.00%. Physical Exam: Pleasant white female in no acute distress. Lungs clear to auscultation with equal breath sounds bilaterally. Heart examination reveals regular rate and rhythm without S3, S4, murmurs. Abdomen is soft with some distention noted. No rigidity noted. No  hepatosplenomegaly or masses noted. Rectal examination is deferred as the patient is having a colonoscopy today.  Assessment/Plan: Impression: Near obstructing right colon neoplasm Plan: Patient is to have a colonoscopy today. Even if this is a benign lesion, she needs a partial colectomy do to its near obstructing nature. The risks and benefits of the procedure including bleeding, infection, cardiopulmonary difficulties, and the possibility of a blood transfusion were fully explained to the patient, who gave informed consent. All questions were answered. Her surgery is temporarily scheduled for tomorrow.  Jamesetta So 08/26/2013, 8:43 AM

## 2013-08-26 NOTE — Progress Notes (Signed)
UR chart review completed.  

## 2013-08-26 NOTE — Op Note (Signed)
COLONOSCOPY PROCEDURE REPORT  PATIENT:  [name] [name]  MR#:  [id number] Birthdate:  [birth year], 79 y.o., female Endoscopist:  Dr. Rogene Houston, MD Referred By:  Dr. Kathie Dike, MD Procedure Date: 08/26/2013  Procedure:   Colonoscopy  Indications: Patient is 80 year old Caucasian female who presents abdominal pain nausea and vomiting. She has lesion in ascending colon suspicious for carcinoma. She is undergoing diagnostic colonoscopy.  Informed Consent:  The procedure and risks were reviewed with the patient and informed consent was obtained.  Medications:  Demerol 50 mg IV Versed 6 mg IV  Description of procedure:  After a digital rectal exam was performed, that colonoscope was advanced from the anus through the rectum and colon to the area of ascending colon revealing circumferential ulcerated mass with luminal narrowing. Did not attempt to pass scope any further. As the scope was slowly withdrawn mucosal surfaces were carefully surveyed utilizing scope tip to flexion to facilitate fold flattening as needed. The scope was pulled down into the rectum where a thorough exam including retroflexion was performed.  Findings:   Prep excellent. Circumferential ulcerated lesion with luminal compromise at ascending colon. Endoscopic findings witnessed by Dr. Arnoldo Morale. Small polyps ablated while cold biopsy from splenic flexure. Few scattered diverticula in sigmoid colon. Normal rectal mucosa and anal rectal junction.    Therapeutic/Diagnostic Maneuvers Performed:  See above  Complications:  None  Cecal Withdrawal Time:  NA  Impression:  Circumferential mass with luminal compromise at ascending colon. Endoscopic appearance consistent with adenocarcinoma. Biopsy not necessary as patient is having right hemicolectomy in a.m. Small polyps ablated via cold biopsy from splenic flexure. Mild sigmoid colon diverticulosis.   Recommendations:  Standard instructions given. Clear liquids  today. Right hemicolectomy tomorrow as planned.  Rogene Houston  08/26/2013 2:57 PM  CC: Dr. Rayne Du PCP Per Patient & Dr. No ref. provider found

## 2013-08-26 NOTE — Progress Notes (Signed)
[facility] PROGRESS NOTE  [name] [name] NIO:[id number] DOB: 07-11-1931 DOA: 08/25/2013 PCP: No PCP Per Patient  Assessment/Plan: Principal Problem:  Small bowel obstruction: CT scan with distention of small bowel loop suspicious for ileus versus partial small bowel obstruction. Likely related to lesion. Evaluated by Dr Arnoldo Morale general surgery who opined pt need partial colectomy due to restrictive nature of lesion. Surgery tentatively scheduled for tomorrow. Pain controlled.  Active Problems:  Lesion of colon: CT with apple core lesion right colon. Concerning for malignancy. Difficulty tolerating prep for colonoscopy.  Procedure today.   Tachycardia: Likely related to abdominal pain and mild dehydration. Resolved.    Anemia: drop in Hg. May be dilutional.  Will monitor   Abdominal pain: Due to #1 and possibly #2. Somewhat worse today as she is trying to drink prep.  See treatments as above. Pain medicine as indicate   Nausea/vomiting. See above.    Code Status: full Family Communication: husband at bedside Disposition Plan: home when ready   Consultants:  GI  General surgery  Procedures:  Colonoscopy 08/26/13>>  Antibiotics:  none  HPI/Subjective: Awake. Complains nausea and abdominal distention. Reports "i cannot drink anymore of that prep".  Objective: Filed Vitals:   08/26/13 0507  BP: 117/73  Pulse: 82  Temp: 98.2 F (36.8 C)  Resp: 18    Intake/Output Summary (Last 24 hours) at 08/26/13 1315 Last data filed at 08/25/13 1844  Gross per 24 hour  Intake    765 ml  Output      0 ml  Net    765 ml   Filed Weights   08/25/13 0943 08/25/13 1548  Weight: 63.504 kg (140 lb) 64.6 kg (142 lb 6.7 oz)    Exam:   General:  Appears mildly uncomfortable  Cardiovascular: RRR No MGR No LE edema  Respiratory: normal effort BS clear bilaterally no wheeze  Abdomen: distended, firm mildly tender to palpation. Diminished BS  Musculoskeletal: no clubbing or  cyanoisis   Data Reviewed: Basic Metabolic Panel:  Recent Labs Lab 08/25/13 1059  NA 140  K 4.9  CL 103  CO2 28  GLUCOSE 120*  BUN 21  CREATININE 0.99  CALCIUM 8.7   Liver Function Tests:  Recent Labs Lab 08/25/13 1059  AST 19  ALT 11  ALKPHOS 112  BILITOT 0.3  PROT 7.1  ALBUMIN 2.4*    Recent Labs Lab 08/25/13 1059  LIPASE 18   No results found for this basename: AMMONIA,  in the last 168 hours CBC:  Recent Labs Lab 08/25/13 1012 08/26/13 0456  WBC 8.1 5.3  NEUTROABS 6.1  --   HGB 11.2* 8.9*  HCT 35.1* 28.5*  MCV 87.3 89.1  PLT 392 299   Cardiac Enzymes: No results found for this basename: CKTOTAL, CKMB, CKMBINDEX, TROPONINI,  in the last 168 hours BNP (last 3 results) No results found for this basename: PROBNP,  in the last 8760 hours CBG: No results found for this basename: GLUCAP,  in the last 168 hours  No results found for this or any previous visit (from the past 240 hour(s)).   Studies: Dg Chest 2 View  08/26/2013   CLINICAL DATA:  : Mass.  Nausea.  EXAM: CHEST  2 VIEW  COMPARISON:  None.  FINDINGS: Heart is normal size. Lungs are clear. No effusions. Degenerative changes in the thoracic spine. No acute bony abnormality.  IMPRESSION: No acute cardiopulmonary disease.   Electronically Signed   By: Rolm Baptise M.D.   On:  08/26/2013 10:53   Ct Abdomen Pelvis W Contrast  08/25/2013   CLINICAL DATA:  Generalized abdominal pain  EXAM: CT ABDOMEN AND PELVIS WITH CONTRAST  TECHNIQUE: Multidetector CT imaging of the abdomen and pelvis was performed using the standard protocol following bolus administration of intravenous contrast.  CONTRAST:  48mL OMNIPAQUE IOHEXOL 300 MG/ML SOLN, 151mL OMNIPAQUE IOHEXOL 300 MG/ML SOLN  COMPARISON:  None.  FINDINGS: Lung bases are unremarkable. Mild dextroscoliosis lower thoracic and lumbar spine. Degenerative changes lumbar spine.  Enhanced liver is unremarkable. No calcified gallstones are noted within gallbladder.  The  pancreas spleen and adrenal glands are unremarkable.  Kidneys are symmetrical in size and enhancement. Multiple large parapelvic cysts are noted right kidney the largest in midpole measures 3.1 cm. Parapelvic cysts are noted within left kidney the largest in lower pole measures 1.9 cm.  Delayed renal images shows bilateral renal symmetrical excretion. Bilateral visualized proximal ureter is unremarkable. Atherosclerotic calcifications of abdominal aorta and iliac arteries.  Mild distended distal small bowel loops with fluid. No any contrast material noted in distal small bowel loops or within colon. Findings highly suspicious for partial small bowel obstruction or significant ileus.  There is distension of the proximal right colon with fluid. In axial image 43 there is apple core lesion in right colon inferior to hepatic flexure. This is highly suspicious for a malignant stricture. This is best visualized in coronal image 35. Further correlation with colonoscopy is recommended.  The terminal ileum is unremarkable.  The patient is status post appendectomy. Trace fluid is noted just inferior to the cecum see coronal image 30.  Sigmoid colon diverticula are noted without evidence of acute diverticulitis PE  The transverse colon, left colon and sigmoid colon are smaller caliber.  Atherosclerotic calcifications of abdominal aorta and iliac arteries. Atherosclerotic calcifications bilateral renal artery origin. No aortic aneurysm.  IMPRESSION: 1. There is apple core lesion in right colon measures about 2.6 x 2.2 cm. This is highly suspicious for malignant colonic stricture. Further evaluation with colonoscopy is recommended. Mild distended of right colon proximal to the stricture. There is distension of distal small bowel loops with fluid. Findings suspicious for significant ileus or partial small bowel obstruction. 2. No ascites or free air. 3. Atherosclerotic calcifications of abdominal aorta and iliac arteries.  Atherosclerotic calcifications are noted bilateral renal artery origin. 4. No hydronephrosis or hydroureter. Bilateral large parapelvic cysts. These results were called by telephone at the time of interpretation on 08/25/2013 at 12:48 PM to Dr. Stark Jock , who verbally acknowledged these results.   Electronically Signed   By: Lahoma Crocker M.D.   On: 08/25/2013 12:50    Scheduled Meds: . [START ON 08/27/2013] alvimopan  12 mg Oral Once  . chlorhexidine  1 application Topical Once  . enoxaparin (LOVENOX) injection  40 mg Subcutaneous Q24H   Continuous Infusions: . sodium chloride 50 mL/hr at 08/25/13 1827  . sodium chloride      Principal Problem:   Small bowel obstruction Active Problems:   Tachycardia   Lesion of colon   Anemia   SBO (small bowel obstruction)    Time spent: [facility] Pager [phone]. If 7PM-7AM, please contact night-coverage at [email or web address], password [facility] 08/26/2013, 1:15 PM  LOS: 1 day

## 2013-08-27 ENCOUNTER — Encounter (HOSPITAL_COMMUNITY): Payer: Self-pay | Admitting: *Deleted

## 2013-08-27 ENCOUNTER — Encounter (HOSPITAL_COMMUNITY)
Admission: EM | Disposition: A | Discharge status: Home / Self Care | Payer: Self-pay | Source: Home / Self Care | Attending: General Surgery

## 2013-08-27 ENCOUNTER — Inpatient Hospital Stay (HOSPITAL_COMMUNITY): Payer: Medicare Other | Admitting: Anesthesiology

## 2013-08-27 ENCOUNTER — Other Ambulatory Visit: Payer: Self-pay | Admitting: General Surgery

## 2013-08-27 ENCOUNTER — Encounter (HOSPITAL_COMMUNITY): Payer: Medicare Other | Admitting: Anesthesiology

## 2013-08-27 DIAGNOSIS — D490 Neoplasm of unspecified behavior of digestive system: Secondary | ICD-10-CM | POA: Diagnosis not present

## 2013-08-27 DIAGNOSIS — E860 Dehydration: Secondary | ICD-10-CM | POA: Diagnosis not present

## 2013-08-27 DIAGNOSIS — D649 Anemia, unspecified: Secondary | ICD-10-CM | POA: Diagnosis not present

## 2013-08-27 DIAGNOSIS — N390 Urinary tract infection, site not specified: Secondary | ICD-10-CM | POA: Diagnosis not present

## 2013-08-27 DIAGNOSIS — C19 Malignant neoplasm of rectosigmoid junction: Secondary | ICD-10-CM | POA: Diagnosis not present

## 2013-08-27 DIAGNOSIS — K6389 Other specified diseases of intestine: Secondary | ICD-10-CM | POA: Diagnosis not present

## 2013-08-27 DIAGNOSIS — K56609 Unspecified intestinal obstruction, unspecified as to partial versus complete obstruction: Secondary | ICD-10-CM | POA: Diagnosis not present

## 2013-08-27 HISTORY — PX: PARTIAL COLECTOMY: SHX5273

## 2013-08-27 LAB — CBC
HEMATOCRIT: 28.7 % — AB (ref 36.0–46.0)
Hemoglobin: 9 g/dL — ABNORMAL LOW (ref 12.0–15.0)
MCH: 27.6 pg (ref 26.0–34.0)
MCHC: 31.4 g/dL (ref 30.0–36.0)
MCV: 88 fL (ref 78.0–100.0)
Platelets: 298 10*3/uL (ref 150–400)
RBC: 3.26 MIL/uL — AB (ref 3.87–5.11)
RDW: 15.5 % (ref 11.5–15.5)
WBC: 5.1 10*3/uL (ref 4.0–10.5)

## 2013-08-27 LAB — HEMOGLOBIN AND HEMATOCRIT, BLOOD
HCT: 37 % (ref 36.0–46.0)
Hemoglobin: 12 g/dL (ref 12.0–15.0)

## 2013-08-27 LAB — SURGICAL PCR SCREEN
MRSA, PCR: NEGATIVE
STAPHYLOCOCCUS AUREUS: NEGATIVE

## 2013-08-27 SURGERY — COLECTOMY, PARTIAL
Anesthesia: General | Site: Abdomen

## 2013-08-27 MED ORDER — FENTANYL CITRATE 0.05 MG/ML IJ SOLN
INTRAMUSCULAR | Status: AC
  Filled 2013-08-27: qty 5

## 2013-08-27 MED ORDER — POVIDONE-IODINE 10 % EX OINT
TOPICAL_OINTMENT | CUTANEOUS | Status: AC
  Filled 2013-08-27: qty 1

## 2013-08-27 MED ORDER — MIDAZOLAM HCL 2 MG/2ML IJ SOLN
INTRAMUSCULAR | Status: AC
  Filled 2013-08-27: qty 2

## 2013-08-27 MED ORDER — FENTANYL CITRATE 0.05 MG/ML IJ SOLN
INTRAMUSCULAR | Status: AC
  Filled 2013-08-27: qty 2

## 2013-08-27 MED ORDER — ENOXAPARIN SODIUM 40 MG/0.4ML ~~LOC~~ SOLN
40.0000 mg | SUBCUTANEOUS | Status: DC
Start: 2013-08-28 — End: 2013-08-31
  Administered 2013-08-28 – 2013-08-30 (×3): 40 mg via SUBCUTANEOUS
  Filled 2013-08-27 (×4): qty 0.4

## 2013-08-27 MED ORDER — SODIUM CHLORIDE 0.9 % IV SOLN
INTRAVENOUS | Status: DC
  Administered 2013-08-27: 100 mL/h via INTRAVENOUS

## 2013-08-27 MED ORDER — ROCURONIUM BROMIDE 50 MG/5ML IV SOLN
INTRAVENOUS | Status: AC
  Filled 2013-08-27: qty 1

## 2013-08-27 MED ORDER — ALVIMOPAN 12 MG PO CAPS
12.0000 mg | ORAL_CAPSULE | Freq: Two times a day (BID) | ORAL | Status: DC
  Administered 2013-08-28 – 2013-08-29 (×4): 12 mg via ORAL
  Filled 2013-08-27 (×4): qty 1

## 2013-08-27 MED ORDER — MIDAZOLAM HCL 2 MG/2ML IJ SOLN
1.0000 mg | INTRAMUSCULAR | Status: DC | PRN
  Administered 2013-08-27 (×2): 2 mg via INTRAVENOUS

## 2013-08-27 MED ORDER — FUROSEMIDE 10 MG/ML IJ SOLN
INTRAMUSCULAR | Status: AC
Start: 2013-08-27 — End: 2013-08-27
  Filled 2013-08-27: qty 4

## 2013-08-27 MED ORDER — ALUM & MAG HYDROXIDE-SIMETH 200-200-20 MG/5ML PO SUSP: 30.0000 mL | Freq: Four times a day (QID) | ORAL | Status: DC | PRN

## 2013-08-27 MED ORDER — METOPROLOL TARTRATE 1 MG/ML IV SOLN
INTRAVENOUS | Status: DC | PRN
  Administered 2013-08-27 (×3): 1 mg via INTRAVENOUS

## 2013-08-27 MED ORDER — LIDOCAINE HCL (CARDIAC) 10 MG/ML IV SOLN
INTRAVENOUS | Status: DC | PRN
  Administered 2013-08-27: 10 mg via INTRAVENOUS

## 2013-08-27 MED ORDER — FUROSEMIDE 10 MG/ML IJ SOLN
20.0000 mg | Freq: Once | INTRAMUSCULAR | Status: AC
  Administered 2013-08-27: 20 mg via INTRAVENOUS

## 2013-08-27 MED ORDER — ALVIMOPAN 12 MG PO CAPS
ORAL_CAPSULE | ORAL | Status: AC
Start: 2013-08-27 — End: 2013-08-27
  Filled 2013-08-27: qty 1

## 2013-08-27 MED ORDER — FENTANYL CITRATE 0.05 MG/ML IJ SOLN
25.0000 ug | INTRAMUSCULAR | Status: DC | PRN
  Administered 2013-08-27 (×2): 25 ug via INTRAVENOUS
  Administered 2013-08-27: 50 ug via INTRAVENOUS

## 2013-08-27 MED ORDER — PROPOFOL 10 MG/ML IV BOLUS
INTRAVENOUS | Status: DC | PRN
  Administered 2013-08-27: 20 mg via INTRAVENOUS
  Administered 2013-08-27: 90 mg via INTRAVENOUS
  Administered 2013-08-27: 20 mg via INTRAVENOUS

## 2013-08-27 MED ORDER — LABETALOL HCL 5 MG/ML IV SOLN
INTRAVENOUS | Status: AC
  Filled 2013-08-27: qty 4

## 2013-08-27 MED ORDER — GLYCOPYRROLATE 0.2 MG/ML IJ SOLN
INTRAMUSCULAR | Status: AC
  Filled 2013-08-27: qty 2

## 2013-08-27 MED ORDER — FENTANYL CITRATE 0.05 MG/ML IJ SOLN
INTRAMUSCULAR | Status: DC | PRN
  Administered 2013-08-27: 50 ug via INTRAVENOUS
  Administered 2013-08-27: 100 ug via INTRAVENOUS
  Administered 2013-08-27: 25 ug via INTRAVENOUS
  Administered 2013-08-27: 50 ug via INTRAVENOUS
  Administered 2013-08-27: 75 ug via INTRAVENOUS
  Administered 2013-08-27 (×4): 50 ug via INTRAVENOUS

## 2013-08-27 MED ORDER — SODIUM CHLORIDE 0.9 % IV SOLN
INTRAVENOUS | Status: AC
  Filled 2013-08-27: qty 1

## 2013-08-27 MED ORDER — ONDANSETRON HCL 4 MG/2ML IJ SOLN
INTRAMUSCULAR | Status: AC
  Filled 2013-08-27: qty 2

## 2013-08-27 MED ORDER — ONDANSETRON HCL 4 MG/2ML IJ SOLN
4.0000 mg | Freq: Once | INTRAMUSCULAR | Status: AC
  Administered 2013-08-27: 4 mg via INTRAVENOUS

## 2013-08-27 MED ORDER — KETOROLAC TROMETHAMINE 30 MG/ML IJ SOLN
30.0000 mg | Freq: Once | INTRAMUSCULAR | Status: AC
  Administered 2013-08-27: 30 mg via INTRAVENOUS

## 2013-08-27 MED ORDER — LABETALOL HCL 5 MG/ML IV SOLN
5.0000 mg | INTRAVENOUS | Status: DC | PRN
  Administered 2013-08-27: 5 mg via INTRAVENOUS

## 2013-08-27 MED ORDER — LIDOCAINE HCL (PF) 1 % IJ SOLN
INTRAMUSCULAR | Status: AC
  Filled 2013-08-27: qty 5

## 2013-08-27 MED ORDER — ROCURONIUM BROMIDE 100 MG/10ML IV SOLN
INTRAVENOUS | Status: DC | PRN
  Administered 2013-08-27: 25 mg via INTRAVENOUS
  Administered 2013-08-27 (×2): 5 mg via INTRAVENOUS

## 2013-08-27 MED ORDER — NEOSTIGMINE METHYLSULFATE 1 MG/ML IJ SOLN
INTRAMUSCULAR | Status: DC | PRN
  Administered 2013-08-27: 1 mg via INTRAVENOUS
  Administered 2013-08-27: 2 mg via INTRAVENOUS

## 2013-08-27 MED ORDER — ONDANSETRON HCL 4 MG/2ML IJ SOLN: 4.0000 mg | Freq: Once | INTRAMUSCULAR | Status: DC | PRN

## 2013-08-27 MED ORDER — KETOROLAC TROMETHAMINE 30 MG/ML IJ SOLN
INTRAMUSCULAR | Status: AC
  Filled 2013-08-27: qty 1

## 2013-08-27 MED ORDER — POVIDONE-IODINE 10 % EX OINT
TOPICAL_OINTMENT | CUTANEOUS | Status: DC | PRN
  Administered 2013-08-27: 1 via TOPICAL

## 2013-08-27 MED ORDER — HYDROMORPHONE HCL PF 1 MG/ML IJ SOLN
1.0000 mg | INTRAMUSCULAR | Status: DC | PRN
  Administered 2013-08-27 – 2013-08-29 (×15): 1 mg via INTRAVENOUS
  Filled 2013-08-27 (×15): qty 1

## 2013-08-27 MED ORDER — LACTATED RINGERS IV SOLN
INTRAVENOUS | Status: DC
  Administered 2013-08-27 (×2): via INTRAVENOUS

## 2013-08-27 MED ORDER — SODIUM CHLORIDE 0.9 % IR SOLN
Status: DC | PRN
  Administered 2013-08-27: 2000 mL

## 2013-08-27 MED ORDER — FENTANYL CITRATE 0.05 MG/ML IJ SOLN: 50.0000 ug | INTRAMUSCULAR | Status: DC | PRN

## 2013-08-27 MED ORDER — GLYCOPYRROLATE 0.2 MG/ML IJ SOLN
INTRAMUSCULAR | Status: DC | PRN
  Administered 2013-08-27 (×2): 0.2 mg via INTRAVENOUS

## 2013-08-27 MED ORDER — SODIUM CHLORIDE 0.9 % IV SOLN
1.0000 g | INTRAVENOUS | Status: AC
  Administered 2013-08-27: 1 g via INTRAVENOUS

## 2013-08-27 SURGICAL SUPPLY — 71 items
APPLIER CLIP 11 MED OPEN (CLIP)
APPLIER CLIP 13 LRG OPEN (CLIP)
BAG HAMPER (MISCELLANEOUS) ×3 IMPLANT
BARRIER SKIN 2 3/4 (OSTOMY) IMPLANT
BARRIER SKIN 2 3/4 INCH (OSTOMY)
CELLS DAT CNTRL 66122 CELL SVR (MISCELLANEOUS) ×1 IMPLANT
CHLORAPREP W/TINT 26ML (MISCELLANEOUS) ×3 IMPLANT
CLAMP POUCH DRAINAGE QUIET (OSTOMY) IMPLANT
CLIP APPLIE 11 MED OPEN (CLIP) IMPLANT
CLIP APPLIE 13 LRG OPEN (CLIP) IMPLANT
CLOTH BEACON ORANGE TIMEOUT ST (SAFETY) ×3 IMPLANT
COVER LIGHT HANDLE STERIS (MISCELLANEOUS) ×6 IMPLANT
COVER MAYO STAND XLG (DRAPE) ×3 IMPLANT
DRAPE UTILITY W/TAPE 26X15 (DRAPES) ×6 IMPLANT
DRAPE WARM FLUID 44X44 (DRAPE) ×3 IMPLANT
DRSG OPSITE POSTOP 4X10 (GAUZE/BANDAGES/DRESSINGS) ×3 IMPLANT
ELECT BLADE 6 FLAT ULTRCLN (ELECTRODE) ×3 IMPLANT
ELECT REM PT RETURN 9FT ADLT (ELECTROSURGICAL) ×3
ELECTRODE REM PT RTRN 9FT ADLT (ELECTROSURGICAL) ×1 IMPLANT
FORMALIN 10 PREFIL 480ML (MISCELLANEOUS) IMPLANT
GLOVE BIO SURGEON STRL SZ7.5 (GLOVE) ×6 IMPLANT
GLOVE BIOGEL PI IND STRL 7.0 (GLOVE) ×3 IMPLANT
GLOVE BIOGEL PI IND STRL 7.5 (GLOVE) ×1 IMPLANT
GLOVE BIOGEL PI IND STRL 8 (GLOVE) IMPLANT
GLOVE BIOGEL PI IND STRL 8.5 (GLOVE) ×2 IMPLANT
GLOVE BIOGEL PI INDICATOR 7.0 (GLOVE) ×6
GLOVE BIOGEL PI INDICATOR 7.5 (GLOVE) ×2
GLOVE BIOGEL PI INDICATOR 8 (GLOVE)
GLOVE BIOGEL PI INDICATOR 8.5 (GLOVE) ×4
GLOVE ECLIPSE 7.0 STRL STRAW (GLOVE) ×18 IMPLANT
GLOVE ECLIPSE 8.5 STRL (GLOVE) ×6 IMPLANT
GOWN STRL REUS W/TWL LRG LVL3 (GOWN DISPOSABLE) ×24 IMPLANT
INST SET MAJOR GENERAL (KITS) ×3 IMPLANT
KIT BLADEGUARD II DBL (SET/KITS/TRAYS/PACK) ×3 IMPLANT
KIT ROOM TURNOVER APOR (KITS) ×3 IMPLANT
LIGASURE IMPACT 36 18CM CVD LR (INSTRUMENTS) ×3 IMPLANT
MANIFOLD NEPTUNE II (INSTRUMENTS) ×3 IMPLANT
NS IRRIG 1000ML POUR BTL (IV SOLUTION) ×6 IMPLANT
PACK ABDOMINAL MAJOR (CUSTOM PROCEDURE TRAY) ×3 IMPLANT
PAD ARMBOARD 7.5X6 YLW CONV (MISCELLANEOUS) ×3 IMPLANT
PENCIL HANDSWITCHING (ELECTRODE) ×3 IMPLANT
POUCH OSTOMY 2 3/4  H 3804 (WOUND CARE)
POUCH OSTOMY 2 PC DRNBL 2.25 (WOUND CARE) IMPLANT
POUCH OSTOMY 2 PC DRNBL 2.75 (WOUND CARE) IMPLANT
POUCH OSTOMY DRNBL 2 1/4 (WOUND CARE)
RELOAD LINEAR CUT PROX 55 BLUE (ENDOMECHANICALS) IMPLANT
RELOAD PROXIMATE 75MM BLUE (ENDOMECHANICALS) ×6 IMPLANT
RETRACTOR WND ALEXIS 25 LRG (MISCELLANEOUS) IMPLANT
RTRCTR WOUND ALEXIS 18CM MED (MISCELLANEOUS) ×3
RTRCTR WOUND ALEXIS 25CM LRG (MISCELLANEOUS)
SET BASIN LINEN APH (SET/KITS/TRAYS/PACK) ×3 IMPLANT
SPONGE GAUZE 4X4 12PLY (GAUZE/BANDAGES/DRESSINGS) IMPLANT
SPONGE LAP 18X18 X RAY DECT (DISPOSABLE) IMPLANT
STAPLER GUN LINEAR PROX 60 (STAPLE) ×3 IMPLANT
STAPLER PROXIMATE 55 BLUE (STAPLE) IMPLANT
STAPLER PROXIMATE 75MM BLUE (STAPLE) ×3 IMPLANT
STAPLER VISISTAT (STAPLE) ×3 IMPLANT
SUCTION POOLE TIP (SUCTIONS) ×3 IMPLANT
SUT CHROMIC 0 SH (SUTURE) ×3 IMPLANT
SUT CHROMIC 2 0 SH (SUTURE) ×3 IMPLANT
SUT CHROMIC 3 0 SH 27 (SUTURE) ×3 IMPLANT
SUT NOVA NAB GS-26 0 60 (SUTURE) IMPLANT
SUT PDS AB 0 CTX 60 (SUTURE) ×6 IMPLANT
SUT SILK 2 0 (SUTURE)
SUT SILK 2 0 REEL (SUTURE) IMPLANT
SUT SILK 2-0 18XBRD TIE 12 (SUTURE) IMPLANT
SUT SILK 3 0 SH CR/8 (SUTURE) ×9 IMPLANT
TOWEL BLUE STERILE X RAY DET (MISCELLANEOUS) ×3 IMPLANT
TOWEL OR 17X26 4PK STRL BLUE (TOWEL DISPOSABLE) ×3 IMPLANT
TRAY FOLEY CATH 16FR SILVER (SET/KITS/TRAYS/PACK) ×3 IMPLANT
YANKAUER SUCT BULB TIP NO VENT (SUCTIONS) ×3 IMPLANT

## 2013-08-27 NOTE — Progress Notes (Signed)
Patients daughter is concerned about her breathing b/c she experienced fluid overload during surgery and had to get a dose of lasix. Re-assessed patients lung sounds, called RT to verify. Lung sounds were clear, paged on-call physician to clarify fluid rate. Will continue to monitor patient and follow any orders given.

## 2013-08-27 NOTE — Transfer of Care (Signed)
Immediate Anesthesia Transfer of Care Note  Patient: [name] [name]  Procedure(s) Performed: Procedure(s): RIGHT HEMICOLECTOMY (N/A)  Patient Location: PACU  Anesthesia Type:General  Level of Consciousness: awake and patient cooperative  Airway & Oxygen Therapy: Patient Spontanous Breathing and Patient connected to face mask oxygen  Post-op Assessment: Report given to PACU RN and Post -op Vital signs reviewed and stable  Post vital signs: Reviewed and stable  Complications: No apparent anesthesia complications

## 2013-08-27 NOTE — OR Nursing (Signed)
Voided times prior   To transporting to OR

## 2013-08-27 NOTE — Progress Notes (Signed)
Request for oncology consult noted.   Patient to undergo right hemicolectomy today.  Will visit post op after pathology is back and discuss need for further intervention.  Thank you for the referral.  G.A. Barnet Glasgow, M.D.

## 2013-08-27 NOTE — Anesthesia Postprocedure Evaluation (Signed)
  Anesthesia Post-op Note  Patient: [name] [name]  Procedure(s) Performed: Procedure(s): RIGHT HEMICOLECTOMY (N/A)  Patient Location: PACU  Anesthesia Type:General  Level of Consciousness: awake, alert , oriented and patient cooperative  Airway and Oxygen Therapy: Patient Spontanous Breathing and Patient connected to nasal cannula oxygen  Post-op Pain: 5 /10, moderate  Post-op Assessment: Post-op Vital signs reviewed, Patient's Cardiovascular Status Stable, Respiratory Function Stable, Patent Airway and Pain level controlled  Post-op Vital Signs: Reviewed and stable  Last Vitals:  Filed Vitals:   08/27/13 1305  BP: 153/68  Pulse:   Temp:   Resp: 21    Complications: No apparent anesthesia complications

## 2013-08-27 NOTE — Anesthesia Procedure Notes (Signed)
Procedure Name: Intubation Date/Time: 08/27/2013 1:18 PM Performed by: Vista Deck Pre-anesthesia Checklist: Patient identified, Patient being monitored, Timeout performed, Emergency Drugs available and Suction available Patient Re-evaluated:Patient Re-evaluated prior to inductionOxygen Delivery Method: Circle System Utilized Preoxygenation: Pre-oxygenation with 100% oxygen Intubation Type: IV induction, Rapid sequence and Cricoid Pressure applied Ventilation: Mask ventilation without difficulty Laryngoscope Size: Miller and 2 Grade View: Grade I Tube type: Oral Tube size: 7.0 mm Number of attempts: 1 Airway Equipment and Method: stylet Placement Confirmation: ETT inserted through vocal cords under direct vision,  positive ETCO2 and breath sounds checked- equal and bilateral Secured at: 20 cm Tube secured with: Tape Dental Injury: Teeth and Oropharynx as per pre-operative assessment

## 2013-08-27 NOTE — Progress Notes (Addendum)
Patient seen and examined. Above note reviewed.  She has undergone partial colectomy to remove ascending colon neoplasm. Further postoperative care per general surgery.  [facility]

## 2013-08-27 NOTE — Op Note (Signed)
Patient:  [name] [name]  DOB:  1932/03/24  MRN:  [id number]   Preop Diagnosis:  ascending colon neoplasm  Postop Diagnosis:  Same  Procedure:  Right hemicolectomy  Surgeon:  Aviva Signs, M.D.  Anes:  General endotracheal  Indications:  Patient is a 80 year old white female who presented emergency room with nausea and vomiting. CT scan the abdomen revealed a neoplastic lesion which was partially obstructing the right colon. The patient underwent a colonoscopy by Dr. Laural Golden of GI yesterday which revealed the area in question highly suspicious for malignancy. The rest of the colon was within normal limits. The patient now comes the operating room for a right hemicolectomy. The risks and benefits of the procedure including bleeding, infection, cardiopulmonary difficulties, and the possibility of a blood transfusion were fully explained to the patient, who gave informed consent  Procedure note:  The patient is placed the supine position. After induction of general endotracheal anesthesia, the abdomen was prepped and draped using usual sterile technique with DuraPrep. Surgical site confirmation was performed.  An incision was made in the midportion of the abdomen just to the right of the umbilicus. The peritoneal cavity was entered into without difficulty. The right colon was mobilized along its peritoneal reflection. The hepatic flexure was taken down using the LigaSure. A GIA stapler was placed across the terminal ileum and fired. This was likewise done to the proximal transverse colon, proximal to the entrance of the middle colic artery. The mesentery of the descending colon was then divided using the LigaSure. The specimen was then sent to pathology further examination. A side to side ileocolic anastomosis was then performed using a GIA-75 stapler. The enterotomy was closed using a TA 60 stapler. The stapler was posterior using 3-0 silk sutures. Surrounding omentum was then used to protect the  anastomosis. The mesenteric defect was closed using a 2-0 chromic gut running suture. The bowel was then returned into the abdominal cavity an orderly fashion. All operating personnel changed their gowns and gloves.  The abdominal cavity was copiously irrigated normal saline. The anastomosis was again inspected and noted to be widely patent. The bowel was returned into the abdominal cavity. The fascia was reapproximated using a looped 0 PDS running suture. The subcutaneous layer was irrigated normal saline and the skin was closed using staples. Betadine ointment and dry sterile dressings were applied.  All tape and needle counts were correct at the end of the procedure. Patient was extubated in the operating room and transferred to PACU in stable condition.  Complications:  None  EBL:  25 cc  Specimen:  Right colon

## 2013-08-27 NOTE — Anesthesia Preprocedure Evaluation (Signed)
Anesthesia Evaluation  Patient identified by MRN, date of birth, ID band Patient awake    Reviewed: Allergy & Precautions  Airway Mallampati: II TM Distance: >3 FB     Dental  (+) Edentulous Upper, Edentulous Lower   Pulmonary neg pulmonary ROS, former smoker,  breath sounds clear to auscultation        Cardiovascular negative cardio ROS  Rhythm:Regular Rate:Normal     Neuro/Psych    GI/Hepatic Colon neoplasm Partial obstruction   Endo/Other    Renal/GU      Musculoskeletal   Abdominal   Peds  Hematology  (+) anemia ,   Anesthesia Other Findings   Reproductive/Obstetrics                           Anesthesia Physical Anesthesia Plan  ASA: II  Anesthesia Plan: General   Post-op Pain Management:    Induction: Intravenous, Rapid sequence and Cricoid pressure planned  Airway Management Planned: Oral ETT  Additional Equipment:   Intra-op Plan:   Post-operative Plan: Extubation in OR  Informed Consent: I have reviewed the patients History and Physical, chart, labs and discussed the procedure including the risks, benefits and alternatives for the proposed anesthesia with the patient or authorized representative who has indicated his/her understanding and acceptance.     Plan Discussed with:   Anesthesia Plan Comments: (Being transfused 1 PRBC  Prior to surgery for Hb=9)        Anesthesia Quick Evaluation

## 2013-08-27 NOTE — Progress Notes (Signed)
Notified Dr. Arnoldo Morale due to the patient family requesting that the patient have dilaudid ordered instead of the fentnyl.  Pt daughter stated th at the fentnyl is not really working for the patient because it doesn't last long and the patient was still in a lot of pain.  New orders given and followed.

## 2013-08-27 NOTE — Progress Notes (Signed)
On-call physician returned my call, got an order to turn patients fluid rate down over night to 50 ml/hr. Will pass this on in report to the am nurse and make sure the rounding MD is aware and gives an order for the fluid rate.

## 2013-08-27 NOTE — Progress Notes (Signed)
[facility] PROGRESS NOTE  [name] [name] KYH:[id number] DOB: May 11, 1932 DOA: 08/25/2013 PCP: No PCP Per Patient   Summary: 80 year old admitted with diarrhea, nausea and vomiting found to have apple core lesion in colon with dilation of proximal colon and small bowel. Colonoscopy and partial colectomy 08/27/13.    Assessment/Plan: Principal Problem:  Small bowel obstruction: CT scan with distention of small bowel loop suspicious for ileus versus partial small bowel obstruction. Likely related to lesion. Evaluated by Dr Arnoldo Morale general surgery who opined pt need partial colectomy due to restrictive nature of lesion. Surgery today.  Pain controlled.  Active Problems:  Lesion of colon: CT with apple core lesion right colon. Colonoscopy 08/26/13 findings consistent adenocarcinoma. See #1. Will request oncology consult   Tachycardia: Likely related to abdominal pain and mild dehydration. Resolved.  Anemia: Hg stable at 9.0. Transfusion per surgery.  Will monitor   Abdominal pain: Due to #1 and possibly #2. Controlled this am.  See treatments as above. Pain medicine as indicate   Nausea/vomiting. Improved this am. See above.   Code Status: full Family Communication: daughter at bedside Disposition Plan: home when ready   Consultants:  General surgery  GI  oncology  Procedures: Colonoscopy 08/26/13 Circumferential ulcerated lesion with luminal compromise at ascending colon. Small polyps ablated while cold biopsy from splenic flexure.  Few scattered diverticula in sigmoid colon. Normal rectal mucosa and anal rectal junction.   Antibiotics: none HPI/Subjective: Awake alert somewhat anxious waiting for surgery.   Objective: Filed Vitals:   08/27/13 0938  BP: 144/84  Pulse: 76  Temp: 98.8 F (37.1 C)  Resp: 18    Intake/Output Summary (Last 24 hours) at 08/27/13 0940 Last data filed at 08/26/13 1747  Gross per 24 hour  Intake    865 ml  Output      9 ml  Net    856 ml    Filed Weights   08/25/13 0943 08/25/13 1548  Weight: 63.504 kg (140 lb) 64.6 kg (142 lb 6.7 oz)    Exam:   General:  Well nourished appears comfortable  Cardiovascular: RRR No MGR No LE edema  Respiratory: normal effort BS clear bilaterally no wheeze  Abdomen: slightly distended but soft Non-tender to palpation. +BS but sluggishe  Musculoskeletal: no clubbing or cyanosis   Data Reviewed: Basic Metabolic Panel:  Recent Labs Lab 08/25/13 1059  NA 140  K 4.9  CL 103  CO2 28  GLUCOSE 120*  BUN 21  CREATININE 0.99  CALCIUM 8.7   Liver Function Tests:  Recent Labs Lab 08/25/13 1059  AST 19  ALT 11  ALKPHOS 112  BILITOT 0.3  PROT 7.1  ALBUMIN 2.4*    Recent Labs Lab 08/25/13 1059  LIPASE 18   No results found for this basename: AMMONIA,  in the last 168 hours CBC:  Recent Labs Lab 08/25/13 1012 08/26/13 0456 08/27/13 0455  WBC 8.1 5.3 5.1  NEUTROABS 6.1  --   --   HGB 11.2* 8.9* 9.0*  HCT 35.1* 28.5* 28.7*  MCV 87.3 89.1 88.0  PLT 392 299 298   Cardiac Enzymes: No results found for this basename: CKTOTAL, CKMB, CKMBINDEX, TROPONINI,  in the last 168 hours BNP (last 3 results) No results found for this basename: PROBNP,  in the last 8760 hours CBG: No results found for this basename: GLUCAP,  in the last 168 hours  No results found for this or any previous visit (from the past 240 hour(s)).   Studies:  Dg Chest 2 View  08/26/2013   CLINICAL DATA:  : Mass.  Nausea.  EXAM: CHEST  2 VIEW  COMPARISON:  None.  FINDINGS: Heart is normal size. Lungs are clear. No effusions. Degenerative changes in the thoracic spine. No acute bony abnormality.  IMPRESSION: No acute cardiopulmonary disease.   Electronically Signed   By: Rolm Baptise M.D.   On: 08/26/2013 10:53   Ct Abdomen Pelvis W Contrast  08/25/2013   CLINICAL DATA:  Generalized abdominal pain  EXAM: CT ABDOMEN AND PELVIS WITH CONTRAST  TECHNIQUE: Multidetector CT imaging of the abdomen and pelvis  was performed using the standard protocol following bolus administration of intravenous contrast.  CONTRAST:  11mL OMNIPAQUE IOHEXOL 300 MG/ML SOLN, 149mL OMNIPAQUE IOHEXOL 300 MG/ML SOLN  COMPARISON:  None.  FINDINGS: Lung bases are unremarkable. Mild dextroscoliosis lower thoracic and lumbar spine. Degenerative changes lumbar spine.  Enhanced liver is unremarkable. No calcified gallstones are noted within gallbladder.  The pancreas spleen and adrenal glands are unremarkable.  Kidneys are symmetrical in size and enhancement. Multiple large parapelvic cysts are noted right kidney the largest in midpole measures 3.1 cm. Parapelvic cysts are noted within left kidney the largest in lower pole measures 1.9 cm.  Delayed renal images shows bilateral renal symmetrical excretion. Bilateral visualized proximal ureter is unremarkable. Atherosclerotic calcifications of abdominal aorta and iliac arteries.  Mild distended distal small bowel loops with fluid. No any contrast material noted in distal small bowel loops or within colon. Findings highly suspicious for partial small bowel obstruction or significant ileus.  There is distension of the proximal right colon with fluid. In axial image 43 there is apple core lesion in right colon inferior to hepatic flexure. This is highly suspicious for a malignant stricture. This is best visualized in coronal image 35. Further correlation with colonoscopy is recommended.  The terminal ileum is unremarkable.  The patient is status post appendectomy. Trace fluid is noted just inferior to the cecum see coronal image 30.  Sigmoid colon diverticula are noted without evidence of acute diverticulitis PE  The transverse colon, left colon and sigmoid colon are smaller caliber.  Atherosclerotic calcifications of abdominal aorta and iliac arteries. Atherosclerotic calcifications bilateral renal artery origin. No aortic aneurysm.  IMPRESSION: 1. There is apple core lesion in right colon measures  about 2.6 x 2.2 cm. This is highly suspicious for malignant colonic stricture. Further evaluation with colonoscopy is recommended. Mild distended of right colon proximal to the stricture. There is distension of distal small bowel loops with fluid. Findings suspicious for significant ileus or partial small bowel obstruction. 2. No ascites or free air. 3. Atherosclerotic calcifications of abdominal aorta and iliac arteries. Atherosclerotic calcifications are noted bilateral renal artery origin. 4. No hydronephrosis or hydroureter. Bilateral large parapelvic cysts. These results were called by telephone at the time of interpretation on 08/25/2013 at 12:48 PM to Dr. Stark Jock , who verbally acknowledged these results.   Electronically Signed   By: Lahoma Crocker M.D.   On: 08/25/2013 12:50    Scheduled Meds:  Continuous Infusions: . sodium chloride 50 mL/hr at 08/25/13 1827    Principal Problem:   Small bowel obstruction Active Problems:   Tachycardia   Lesion of colon   Anemia   SBO (small bowel obstruction)    Time spent: 30 minutes    [facility] Pager [phone]. If 7PM-7AM, please contact night-coverage at [email or web address], password [facility] 08/27/2013, 9:40 AM  LOS: 2 days

## 2013-08-28 LAB — MAGNESIUM: Magnesium: 1.7 mg/dL (ref 1.5–2.5)

## 2013-08-28 LAB — BASIC METABOLIC PANEL
BUN: 9 mg/dL (ref 6–23)
CO2: 28 mEq/L (ref 19–32)
Calcium: 7.6 mg/dL — ABNORMAL LOW (ref 8.4–10.5)
Chloride: 104 mEq/L (ref 96–112)
Creatinine, Ser: 0.81 mg/dL (ref 0.50–1.10)
GFR calc Af Amer: 78 mL/min — ABNORMAL LOW (ref >90)
GFR, EST NON AFRICAN AMERICAN: 67 mL/min — AB (ref >90)
GLUCOSE: 121 mg/dL — AB (ref 70–99)
POTASSIUM: 3.8 meq/L (ref 3.7–5.3)
Sodium: 142 mEq/L (ref 137–147)

## 2013-08-28 LAB — CBC
HEMATOCRIT: 34.9 % — AB (ref 36.0–46.0)
HEMOGLOBIN: 11.3 g/dL — AB (ref 12.0–15.0)
MCH: 28.4 pg (ref 26.0–34.0)
MCHC: 32.4 g/dL (ref 30.0–36.0)
MCV: 87.7 fL (ref 78.0–100.0)
Platelets: 338 10*3/uL (ref 150–400)
RBC: 3.98 MIL/uL (ref 3.87–5.11)
RDW: 15.5 % (ref 11.5–15.5)
WBC: 12 10*3/uL — ABNORMAL HIGH (ref 4.0–10.5)

## 2013-08-28 LAB — PHOSPHORUS: PHOSPHORUS: 4.1 mg/dL (ref 2.3–4.6)

## 2013-08-28 NOTE — Progress Notes (Signed)
Informed by Dr. Arnoldo Morale that he has graciously offered to assume care of this patient.  [facility] will be available for any further questions/concerns.  Please don't hesitate to call us back. Will sign off.  [employer]

## 2013-08-28 NOTE — Progress Notes (Signed)
1 Day Post-Op  Subjective: No shortness of breath. Mild incisional pain.  Objective: Vital signs in last 24 hours: Temp:  [97.4 F (36.3 C)-99.1 F (37.3 C)] 98.4 F (36.9 C) (04/11 0530) Pulse Rate:  [61-99] 96 (04/11 0530) Resp:  [12-32] 20 (04/11 0530) BP: (133-184)/(56-104) 136/62 mmHg (04/11 0530) SpO2:  [94 %-100 %] 97 % (04/11 0530) Last BM Date: 08/26/13  Intake/Output from previous day: 04/10 0701 - 04/11 0700 In: 2000 [I.V.:2000] Out: 2625 [Urine:2600; Blood:25] Intake/Output this shift:    General appearance: alert, cooperative and no distress Resp: clear to auscultation bilaterally Cardio: regular rate and rhythm, S1, S2 normal, no murmur, click, rub or gallop GI: Soft. Dressing dry and intact.  Lab Results:   Recent Labs  08/27/13 0455 08/27/13 1756 08/28/13 0630  WBC 5.1  --  12.0*  HGB 9.0* 12.0 11.3*  HCT 28.7* 37.0 34.9*  PLT 298  --  338   BMET  Recent Labs  08/25/13 1059  NA 140  K 4.9  CL 103  CO2 28  GLUCOSE 120*  BUN 21  CREATININE 0.99  CALCIUM 8.7   PT/INR  Recent Labs  08/25/13 1059  LABPROT 13.0  INR 1.00    Studies/Results: Dg Chest 2 View  08/26/2013   CLINICAL DATA:  : Mass.  Nausea.  EXAM: CHEST  2 VIEW  COMPARISON:  None.  FINDINGS: Heart is normal size. Lungs are clear. No effusions. Degenerative changes in the thoracic spine. No acute bony abnormality.  IMPRESSION: No acute cardiopulmonary disease.   Electronically Signed   By: Rolm Baptise M.D.   On: 08/26/2013 10:53    Anti-infectives: Anti-infectives   Start     Dose/Rate Route Frequency Ordered Stop   08/27/13 1118  ertapenem (INVANZ) 1 g in sodium chloride 0.9 % 50 mL IVPB     1 g 100 mL/hr over 30 Minutes Intravenous On call to O.R. 08/27/13 1118 08/27/13 1305   08/26/13 0915  ertapenem (INVANZ) 1 g in sodium chloride 0.9 % 50 mL IVPB  Status:  Discontinued     1 g 100 mL/hr over 30 Minutes Intravenous On call to O.R. 08/26/13 0912 08/26/13 0915       Assessment/Plan: s/p Procedure(s): RIGHT HEMICOLECTOMY Impression: Stable on postoperative day one. Hemoglobin stable. Awaiting B. Met results. Plan: We'll get patient up in chair. We'll advance to full liquid diet as per patient request. We'll continue to monitor.  LOS: 3 days    Jamesetta So 08/28/2013

## 2013-08-28 NOTE — Addendum Note (Signed)
Addendum created 08/28/13 1222 by Charmaine Downs, CRNA   Modules edited: Notes Section   Notes Section:  File: [id number]

## 2013-08-28 NOTE — Anesthesia Postprocedure Evaluation (Signed)
Anesthesia Post-op Note  Patient: [name] [name]  Procedure(s) Performed: Procedure(s): RIGHT HEMICOLECTOMY (N/A)  Patient Location: room 303  Anesthesia Type:General  Level of Consciousness: awake, alert , oriented and patient cooperative  Airway and Oxygen Therapy: Patient Spontanous Breathing and Patient connected to nasal cannula oxygen  Post-op Pain: 8 /10, moderate  Post-op Assessment: Post-op Vital signs reviewed, Patient's Cardiovascular Status Stable, Respiratory Function Stable, Patent Airway, No signs of Nausea or vomiting and Adequate PO intake  Post-op Vital Signs: Reviewed and stable  Last Vitals:  Filed Vitals:   08/28/13 0530  BP: 136/62  Pulse: 96  Temp: 36.9 C  Resp: 20    Complications: No apparent anesthesia complications

## 2013-08-29 LAB — BASIC METABOLIC PANEL
BUN: 10 mg/dL (ref 6–23)
CO2: 30 meq/L (ref 19–32)
Calcium: 7.9 mg/dL — ABNORMAL LOW (ref 8.4–10.5)
Chloride: 101 mEq/L (ref 96–112)
Creatinine, Ser: 0.75 mg/dL (ref 0.50–1.10)
GFR calc Af Amer: >90 mL/min (ref >90)
GFR calc non Af Amer: 78 mL/min — ABNORMAL LOW (ref >90)
Glucose, Bld: 116 mg/dL — ABNORMAL HIGH (ref 70–99)
Potassium: 3.7 mEq/L (ref 3.7–5.3)
SODIUM: 138 meq/L (ref 137–147)

## 2013-08-29 LAB — CBC
HCT: 32.2 % — ABNORMAL LOW (ref 36.0–46.0)
Hemoglobin: 10 g/dL — ABNORMAL LOW (ref 12.0–15.0)
MCH: 27.4 pg (ref 26.0–34.0)
MCHC: 31.1 g/dL (ref 30.0–36.0)
MCV: 88.2 fL (ref 78.0–100.0)
Platelets: 317 10*3/uL (ref 150–400)
RBC: 3.65 MIL/uL — AB (ref 3.87–5.11)
RDW: 15.5 % (ref 11.5–15.5)
WBC: 9.3 10*3/uL (ref 4.0–10.5)

## 2013-08-29 LAB — PHOSPHORUS: PHOSPHORUS: 2.2 mg/dL — AB (ref 2.3–4.6)

## 2013-08-29 LAB — MAGNESIUM: Magnesium: 1.9 mg/dL (ref 1.5–2.5)

## 2013-08-29 MED ORDER — DOCUSATE SODIUM 100 MG PO CAPS
100.0000 mg | ORAL_CAPSULE | Freq: Two times a day (BID) | ORAL | Status: DC
  Administered 2013-08-29 – 2013-08-30 (×3): 100 mg via ORAL
  Filled 2013-08-29 (×3): qty 1

## 2013-08-29 NOTE — Progress Notes (Signed)
Late entry for 2000. Paged Dr. Arnoldo Morale per patients family b/c they were requesting robitussin for her cough. Dr. Arnoldo Morale said it's good the patient is coughing and does not want to suppress her cough at this time. Will continue to monitor patient.

## 2013-08-29 NOTE — Progress Notes (Signed)
2 Days Post-Op  Subjective: Has passed a little gas. Is coughing less this morning. Mild incisional pain.  Objective: Vital signs in last 24 hours: Temp:  [98.2 F (36.8 C)-99 F (37.2 C)] 98.5 F (36.9 C) (04/12 0512) Pulse Rate:  [90-92] 90 (04/12 0512) Resp:  [20] 20 (04/12 0512) BP: (119-131)/(46-67) 127/67 mmHg (04/12 0512) SpO2:  [85 %-99 %] 98 % (04/12 0512) Last BM Date: 08/26/13  Intake/Output from previous day: 04/11 0701 - 04/12 0700 In: 1340 [P.O.:840; I.V.:500] Out: 750 [Urine:750] Intake/Output this shift:    General appearance: alert, cooperative and no distress Resp: clear to auscultation bilaterally Cardio: regular rate and rhythm, S1, S2 normal, no murmur, click, rub or gallop GI: Soft. Occasional bowel sounds appreciated. Incision healing well.  Lab Results:   Recent Labs  08/28/13 0630 08/29/13 0551  WBC 12.0* 9.3  HGB 11.3* 10.0*  HCT 34.9* 32.2*  PLT 338 317   BMET  Recent Labs  08/28/13 0630 08/29/13 0551  NA 142 138  K 3.8 3.7  CL 104 101  CO2 28 30  GLUCOSE 121* 116*  BUN 9 10  CREATININE 0.81 0.75  CALCIUM 7.6* 7.9*   PT/INR No results found for this basename: LABPROT, INR,  in the last 72 hours  Studies/Results: No results found.  Anti-infectives: Anti-infectives   Start     Dose/Rate Route Frequency Ordered Stop   08/27/13 1118  ertapenem (INVANZ) 1 g in sodium chloride 0.9 % 50 mL IVPB     1 g 100 mL/hr over 30 Minutes Intravenous On call to O.R. 08/27/13 1118 08/27/13 1305   08/26/13 0915  ertapenem (INVANZ) 1 g in sodium chloride 0.9 % 50 mL IVPB  Status:  Discontinued     1 g 100 mL/hr over 30 Minutes Intravenous On call to O.R. 08/26/13 0912 08/26/13 0915      Assessment/Plan: s/p Procedure(s): RIGHT HEMICOLECTOMY Impression: Stable on postoperative day 2. Bowel function starting returned. Respiratory status stable. Hemoglobin stable. Plan: Will increase ambulation. Will advance diet once bowel function has  fully returned. Will remove telemetry.  LOS: 4 days    Jamesetta So 08/29/2013

## 2013-08-30 ENCOUNTER — Encounter (HOSPITAL_COMMUNITY): Payer: Self-pay | Admitting: Internal Medicine

## 2013-08-30 LAB — TYPE AND SCREEN
ABO/RH(D): O POS
ANTIBODY SCREEN: NEGATIVE
UNIT DIVISION: 0
Unit division: 0
Unit division: 0

## 2013-08-30 NOTE — Progress Notes (Signed)
Changed the patients diet to Mechanical soft as per order of advancement of diet once patient has bm.  According to report from night nurse the patient had a medium size liquid stool.  I will continue to monitor her after her breakfast.

## 2013-08-30 NOTE — Progress Notes (Signed)
3 Days Post-Op  Subjective: Has had bowel movement. Tolerating full liquids well. Has been advanced to mechanical soft diet. Minimal incisional pain. Oxygenation appeared to be better.  Objective: Vital signs in last 24 hours: Temp:  [98.2 F (36.8 C)-99 F (37.2 C)] 98.8 F (37.1 C) (04/12 2120) Pulse Rate:  [79-96] 79 (04/13 0559) Resp:  [16-20] 20 (04/13 0559) BP: (120-155)/(63-78) 155/69 mmHg (04/13 0559) SpO2:  [85 %-99 %] 99 % (04/13 0559) Last BM Date: 08/30/13  Intake/Output from previous day: 04/12 0701 - 04/13 0700 In: -  Out: 450 [Urine:450] Intake/Output this shift:    General appearance: alert, cooperative and no distress Resp: clear to auscultation bilaterally Cardio: regular rate and rhythm, S1, S2 normal, no murmur, click, rub or gallop GI: Soft. Incision healing well. Active bowel sounds appreciated.  Lab Results:   Recent Labs  08/28/13 0630 08/29/13 0551  WBC 12.0* 9.3  HGB 11.3* 10.0*  HCT 34.9* 32.2*  PLT 338 317   BMET  Recent Labs  08/28/13 0630 08/29/13 0551  NA 142 138  K 3.8 3.7  CL 104 101  CO2 28 30  GLUCOSE 121* 116*  BUN 9 10  CREATININE 0.81 0.75  CALCIUM 7.6* 7.9*   PT/INR No results found for this basename: LABPROT, INR,  in the last 72 hours  Studies/Results: No results found.  Anti-infectives: Anti-infectives   Start     Dose/Rate Route Frequency Ordered Stop   08/27/13 1118  ertapenem (INVANZ) 1 g in sodium chloride 0.9 % 50 mL IVPB     1 g 100 mL/hr over 30 Minutes Intravenous On call to O.R. 08/27/13 1118 08/27/13 1305   08/26/13 0915  ertapenem (INVANZ) 1 g in sodium chloride 0.9 % 50 mL IVPB  Status:  Discontinued     1 g 100 mL/hr over 30 Minutes Intravenous On call to O.R. 08/26/13 0912 08/26/13 0915      Assessment/Plan: s/p Procedure(s): RIGHT HEMICOLECTOMY Impression: Postoperative day 3. Bowel function has returned. Her oxygenation has progressed slowly gotten better. Plan: Advance to soft diet.  Will stop O2 nasal cannula and see how patient saturates. Anticipate discharge in next 24-48 hours.  LOS: 5 days    Jamesetta So 08/30/2013

## 2013-08-30 NOTE — Care Management Note (Addendum)
    Page 1 of 1   08/31/2013     9:18:08 AM   CARE MANAGEMENT NOTE 08/31/2013  Patient:  [name] [name]   Account Number:  [id number]  Date Initiated:  08/30/2013  Documentation initiated by:  Theophilus Kinds  Subjective/Objective Assessment:   Pt admitted from home s/p colectomy. Pt lives with her husband and will return home at discharge. Pt is independent with ADL's.     Action/Plan:   No CM needs noted.   Anticipated DC Date:  08/31/2013   Anticipated DC Plan:  [place]  CM consult      Choice offered to / List presented to:             Status of service:  Completed, signed off Medicare Important Message given?  YES (If response is "NO", the following Medicare IM given date fields will be blank) Date Medicare IM given:  08/31/2013 Date Additional Medicare IM given:    Discharge Disposition:  HOME/SELF CARE  Per UR Regulation:    If discussed at Long Length of Stay Meetings, dates discussed:   08/31/2013    Comments:  08/31/13 0915 [name] Gully, RN BSN CM Pt discharged home today. No CM needs noted.  08/30/13 [place], RN BSN CM

## 2013-08-31 MED ORDER — HYDROCODONE-ACETAMINOPHEN 5-325 MG PO TABS: 1.0000 | ORAL_TABLET | ORAL | Status: AC | PRN

## 2013-08-31 NOTE — Progress Notes (Signed)
Patient with orders to be discharge home. Discharge instructions given, patient verbalized understanding. Prescriptions given. Patient stable. Patient left with daughter in private vehicle.

## 2013-08-31 NOTE — Progress Notes (Signed)
UR chart review completed.  

## 2013-08-31 NOTE — Discharge Summary (Signed)
Physician Discharge Summary  Patient ID: [name] [name] MRN: [id number] DOB/AGE: 06-11-1931 80 y.o.  Admit date: 08/25/2013 Discharge date: 08/31/2013  Admission Diagnoses: Colon neoplasm, partial bowel obstruction  Discharge Diagnoses: Same Principal Problem:   Small bowel obstruction Active Problems:   Tachycardia   Lesion of colon   Anemia   SBO (small bowel obstruction)   Discharged Condition: good  Hospital Course: Patient is a 80 year old white female who presented emergency room with worsening nausea and vomiting. She stated that she had also had looser bowel movements of late. CT scan the abdomen revealed a partial small bowel obstruction with a mass in the descending colon. She was admitted to the hospital for further evaluation treatment. She underwent colonoscopy by Dr. 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 She subsequently underwent a right hemicolectomy on 08/27/2013. She tolerated procedure well. Her diet was advanced at difficulty. Final pathology is still pending. She is being discharged home on 08/31/2013 in good improving condition.  Treatments: surgery: Colonoscopy on 08/26/2013 Right hemicolectomy on 08/27/2013  Discharge Exam: Blood pressure 158/75, pulse 81, temperature 98.5 F (36.9 C), temperature source Oral, resp. rate 20, height 5\' 3"  (1.6 m), weight 64.6 kg (142 lb 6.7 oz), SpO2 98.00%. General appearance: alert, cooperative and no distress Resp: clear to auscultation bilaterally Cardio: regular rate and rhythm, S1, S2 normal, no murmur, click, rub or gallop GI: Soft. Incision healing well.  Disposition: Home    Medication List    STOP taking these medications       BC HEADACHE POWDER PO     SYSTANE OP      TAKE these medications       HYDROcodone-acetaminophen 5-325 MG per tablet  Commonly known as:  NORCO/VICODIN  Take 1 tablet by mouth every 4 (four) hours as needed for moderate pain.           Follow-up Information   Follow up with Jamesetta So, MD. Schedule an appointment as soon as possible for a visit on 09/07/2013.   Specialty:  General Surgery   Contact information:   [id number] [address] [phone]       Signed: Jamesetta So 08/31/2013, 9:07 AM

## 2013-08-31 NOTE — Discharge Instructions (Signed)
Open Colectomy, Care After °Refer to this sheet in the next few weeks. These instructions provide you with information on caring for yourself after your procedure. Your health care provider may also give you more specific instructions. Your treatment has been planned according to current medical practices, but problems sometimes occur. Call your health care provider if you have any problems or questions after your procedure. °WHAT TO EXPECT AFTER THE PROCEDURE °After your procedure, it is typical to have the following: °· Pain in your abdomen, especially along your incision. You will be given medicines to control the pain. °· Tiredness. This is a normal part of the recovery process. Your energy level will return to normal over the next several weeks. °· Constipation. You may be given a stool softener to prevent this. °HOME CARE INSTRUCTIONS °· Only take over-the-counter or prescription medicines as directed by your health care provider. °· Ask your health care provider whether you may take a shower when you go home. °· You may resume a normal diet and activities as directed. Eat plenty of fruits and vegetables to help prevent constipation. °· Drink enough fluids to keep your urine clear or pale yellow. This also helps prevent constipation. °· Take rest breaks during the day as needed. °· Avoid lifting anything heavier than 25 pounds (11.3 kg) or driving for 4 weeks or until your health care provider says it is okay. °· Follow up with your health care provider as directed. Ask your health care provider when you need to return to have your stitches or staples removed. °SEEK MEDICAL CARE IF: °· You have redness, swelling, or increasing pain in the incision area. °· You see pus coming from the incision area. °· You have a fever. °SEEK IMMEDIATE MEDICAL CARE IF:  °· You have chest pain or shortness of breath. °· You have pain or swelling in your legs. °· You have persistent nausea and vomiting. °· Your wound breaks open  after stitches or staples have been removed. °· You have increasing abdominal pain that is not relieved with medicine. °Document Released: 11/27/2010 Document Revised: 02/24/2013 Document Reviewed: 12/16/2012 °[employer]® Patient Information ©2014 [employer]. ° °

## 2013-09-08 ENCOUNTER — Other Ambulatory Visit (HOSPITAL_COMMUNITY): Payer: Self-pay | Admitting: *Deleted

## 2013-09-11 DIAGNOSIS — C189 Malignant neoplasm of colon, unspecified: Secondary | ICD-10-CM | POA: Insufficient documentation

## 2013-09-13 ENCOUNTER — Encounter (HOSPITAL_COMMUNITY): Payer: Self-pay

## 2013-09-15 ENCOUNTER — Encounter (HOSPITAL_COMMUNITY): Payer: Medicare Other | Attending: Hematology and Oncology

## 2013-09-15 ENCOUNTER — Encounter (HOSPITAL_COMMUNITY): Payer: Self-pay

## 2013-09-15 VITALS — BP 166/82 | HR 90 | Temp 98.6°F | Resp 16 | Ht 62.0 in | Wt 137.4 lb

## 2013-09-15 DIAGNOSIS — Z87891 Personal history of nicotine dependence: Secondary | ICD-10-CM | POA: Diagnosis not present

## 2013-09-15 DIAGNOSIS — Z9049 Acquired absence of other specified parts of digestive tract: Secondary | ICD-10-CM | POA: Diagnosis not present

## 2013-09-15 DIAGNOSIS — D500 Iron deficiency anemia secondary to blood loss (chronic): Secondary | ICD-10-CM | POA: Diagnosis not present

## 2013-09-15 DIAGNOSIS — D649 Anemia, unspecified: Secondary | ICD-10-CM

## 2013-09-15 DIAGNOSIS — C180 Malignant neoplasm of cecum: Secondary | ICD-10-CM | POA: Diagnosis not present

## 2013-09-15 DIAGNOSIS — C189 Malignant neoplasm of colon, unspecified: Secondary | ICD-10-CM | POA: Diagnosis not present

## 2013-09-15 LAB — CBC WITH DIFFERENTIAL/PLATELET
Basophils Absolute: 0 10*3/uL (ref 0.0–0.1)
Basophils Relative: 0 % (ref 0–1)
Eosinophils Absolute: 0.1 10*3/uL (ref 0.0–0.7)
Eosinophils Relative: 2 % (ref 0–5)
HEMATOCRIT: 34.7 % — AB (ref 36.0–46.0)
HEMOGLOBIN: 11.1 g/dL — AB (ref 12.0–15.0)
LYMPHS PCT: 37 % (ref 12–46)
Lymphs Abs: 2 10*3/uL (ref 0.7–4.0)
MCH: 27.7 pg (ref 26.0–34.0)
MCHC: 32 g/dL (ref 30.0–36.0)
MCV: 86.5 fL (ref 78.0–100.0)
MONO ABS: 0.7 10*3/uL (ref 0.1–1.0)
Monocytes Relative: 13 % — ABNORMAL HIGH (ref 3–12)
NEUTROS ABS: 2.6 10*3/uL (ref 1.7–7.7)
Neutrophils Relative %: 48 % (ref 43–77)
Platelets: 354 10*3/uL (ref 150–400)
RBC: 4.01 MIL/uL (ref 3.87–5.11)
RDW: 15.6 % — ABNORMAL HIGH (ref 11.5–15.5)
WBC: 5.4 10*3/uL (ref 4.0–10.5)

## 2013-09-15 LAB — COMPREHENSIVE METABOLIC PANEL
ALT: 11 U/L (ref 0–35)
AST: 19 U/L (ref 0–37)
Albumin: 2.6 g/dL — ABNORMAL LOW (ref 3.5–5.2)
Alkaline Phosphatase: 110 U/L (ref 39–117)
BUN: 17 mg/dL (ref 6–23)
CHLORIDE: 103 meq/L (ref 96–112)
CO2: 29 meq/L (ref 19–32)
CREATININE: 0.81 mg/dL (ref 0.50–1.10)
Calcium: 8.7 mg/dL (ref 8.4–10.5)
GFR calc Af Amer: 78 mL/min — ABNORMAL LOW (ref >90)
GFR, EST NON AFRICAN AMERICAN: 67 mL/min — AB (ref >90)
Glucose, Bld: 96 mg/dL (ref 70–99)
Potassium: 4.3 mEq/L (ref 3.7–5.3)
Sodium: 143 mEq/L (ref 137–147)
Total Bilirubin: 0.2 mg/dL — ABNORMAL LOW (ref 0.3–1.2)
Total Protein: 7.1 g/dL (ref 6.0–8.3)

## 2013-09-15 LAB — FERRITIN: FERRITIN: 49 ng/mL (ref 10–291)

## 2013-09-15 NOTE — Progress Notes (Signed)
Grover A. Barnet Glasgow, M.D.  NEW PATIENT EVALUATION   Name: [name] [name] Date: 09/15/2013 MRN: [id number] DOB: 06/24/1931  PCP: No PCP Per Patient   REFERRING PHYSICIAN: No ref. provider found  REASON FOR REFERRAL: Colon cancer     HISTORY OF PRESENT ILLNESS:[name] [name] is a 80 y.o. female who is referred after recent hospitalization when the patient presented with bowel obstruction and underwent right hemicolectomy on 08/27/2013 for adenocarcinoma of the colon, pT3 N0 M0(stage II-A). Microsatellite stability was found and currently her tumor tissue is being analyzed by Oncotype DX colon to determine what her prognosis is in regard to likelihood of relapse. A high recurrence score will only indicate prognosis and not predict efficacy of any chemotherapy intervention. The test is done to estimate the likelihood of relapse but has not been validated to predict efficacy of adjuvant treatment intervention. She has recuperated well from her surgery. Appetite is good with bowel movements every other day. She denies a diarrhea, melena, hematochezia, hematuria, but has had nasal drip without sore throat, earache, or severe headache. She denies any fever, night sweats, recurrent abdominal distention or pain, skin rash, joint pain, headache, or seizures.   PAST MEDICAL HISTORY:  has a past medical history of Medical history non-contributory; Headache(784.0); Arthritis; and Colon cancer.     PAST SURGICAL HISTORY: Past Surgical History  Procedure Laterality Date  . Abdominal hysterectomy    . Appendectomy    . Intraocular lens implant, secondary Bilateral 2007    bilateral  . Tonsillectomy    . Colonoscopy N/A 08/26/2013    Procedure: COLONOSCOPY;  Surgeon: Rogene Houston, MD;  Location: [facility];  Service: Endoscopy;  Laterality: N/A;  . Partial colectomy N/A 08/27/2013    Procedure: RIGHT HEMICOLECTOMY;  Surgeon: Jamesetta So, MD;   Location: [facility];  Service: General;  Laterality: N/A;     CURRENT MEDICATIONS: has a current medication list which includes the following prescription(s): docusate sodium and hydrocodone-acetaminophen.   ALLERGIES: Review of patient's allergies indicates no known allergies.   SOCIAL HISTORY:  reports that she has quit smoking. Her smoking use included Cigarettes. She has a 7.5 pack-year smoking history. She has never used smokeless tobacco. She reports that she does not drink alcohol or use illicit drugs.   FAMILY HISTORY: family history includes Cancer in her brother. There is no history of Colon cancer.    REVIEW OF SYSTEMS:  Other than that discussed above is noncontributory.    PHYSICAL EXAM:  height is 5' 2" (1.575 m) and weight is 137 lb 6.4 oz (62.324 kg). Her oral temperature is 98.6 F (37 C). Her blood pressure is 166/82 and her pulse is 90. Her respiration is 16.    GENERAL:alert, no distress and comfortable, looking much younger than her stated age. SKIN: skin color, texture, turgor are normal, no rashes or significant lesions EYES: normal, Conjunctiva are pink and non-injected, sclera clear OROPHARYNX:no exudate, no erythema and lips, buccal mucosa, and tongue normal  NECK: supple, thyroid normal size, non-tender, without nodularity CHEST: Normal AP diameter with no breast masses.Marland Kitchen LYMPH:  no palpable lymphadenopathy in the cervical, axillary or inguinal LUNGS: clear to auscultation and percussion with normal breathing effort HEART: regular rate & rhythm and no murmurs ABDOMEN:abdomen soft, non-tender and normal bowel sounds. Surgical wounds well healed with no evidence of hepatosplenomegaly, ascites, or CVA tenderness. MUSCULOSKELETALl:no cyanosis of digits, no clubbing or edema  NEURO: alert & oriented x 3 with fluent speech, no focal motor/sensory deficits    LABORATORY DATA:  Admission on 08/25/2013, Discharged on 08/31/2013  Component Date Value Ref Range  Status  . WBC 08/25/2013 8.1  4.0 - 10.5 K/uL Final  . RBC 08/25/2013 4.02  3.87 - 5.11 MIL/uL Final  . Hemoglobin 08/25/2013 11.2* 12.0 - 15.0 g/dL Final  . HCT 08/25/2013 35.1* 36.0 - 46.0 % Final  . MCV 08/25/2013 87.3  78.0 - 100.0 fL Final  . MCH 08/25/2013 27.9  26.0 - 34.0 pg Final  . MCHC 08/25/2013 31.9  30.0 - 36.0 g/dL Final  . RDW 08/25/2013 15.8* 11.5 - 15.5 % Final  . Platelets 08/25/2013 392  150 - 400 K/uL Final  . Neutrophils Relative % 08/25/2013 74  43 - 77 % Final  . Neutro Abs 08/25/2013 6.1  1.7 - 7.7 K/uL Final  . Lymphocytes Relative 08/25/2013 16  12 - 46 % Final  . Lymphs Abs 08/25/2013 1.3  0.7 - 4.0 K/uL Final  . Monocytes Relative 08/25/2013 10  3 - 12 % Final  . Monocytes Absolute 08/25/2013 0.8  0.1 - 1.0 K/uL Final  . Eosinophils Relative 08/25/2013 0  0 - 5 % Final  . Eosinophils Absolute 08/25/2013 0.0  0.0 - 0.7 K/uL Final  . Basophils Relative 08/25/2013 0  0 - 1 % Final  . Basophils Absolute 08/25/2013 0.0  0.0 - 0.1 K/uL Final  . Sodium 08/25/2013 140  137 - 147 mEq/L Final  . Potassium 08/25/2013 4.9  3.7 - 5.3 mEq/L Final  . Chloride 08/25/2013 103  96 - 112 mEq/L Final  . CO2 08/25/2013 28  19 - 32 mEq/L Final  . Glucose, Bld 08/25/2013 120* 70 - 99 mg/dL Final  . BUN 08/25/2013 21  6 - 23 mg/dL Final  . Creatinine, Ser 08/25/2013 0.99  0.50 - 1.10 mg/dL Final  . Calcium 08/25/2013 8.7  8.4 - 10.5 mg/dL Final  . Total Protein 08/25/2013 7.1  6.0 - 8.3 g/dL Final  . Albumin 08/25/2013 2.4* 3.5 - 5.2 g/dL Final  . AST 08/25/2013 19  0 - 37 U/L Final  . ALT 08/25/2013 11  0 - 35 U/L Final  . Alkaline Phosphatase 08/25/2013 112  39 - 117 U/L Final  . Total Bilirubin 08/25/2013 0.3  0.3 - 1.2 mg/dL Final  . GFR calc non Af Amer 08/25/2013 53* >90 mL/min Final  . GFR calc Af Amer 08/25/2013 61* >90 mL/min Final   Comment: (NOTE)                          The eGFR has been calculated using the CKD EPI equation.                          This  calculation has not been validated in all clinical situations.                          eGFR's persistently <90 mL/min signify possible Chronic Kidney                          Disease.  . Color, Urine 08/25/2013 YELLOW  YELLOW Final  . APPearance 08/25/2013 HAZY* CLEAR Final  . Specific Gravity, Urine 08/25/2013 >1.030* 1.005 - 1.030 Final  . pH 08/25/2013 6.0  5.0 - 8.0   Final  . Glucose, UA 08/25/2013 NEGATIVE  NEGATIVE mg/dL Final  . Hgb urine dipstick 08/25/2013 NEGATIVE  NEGATIVE Final  . Bilirubin Urine 08/25/2013 SMALL* NEGATIVE Final  . Ketones, ur 08/25/2013 TRACE* NEGATIVE mg/dL Final  . Protein, ur 08/25/2013 30* NEGATIVE mg/dL Final  . Urobilinogen, UA 08/25/2013 1.0  0.0 - 1.0 mg/dL Final  . Nitrite 08/25/2013 POSITIVE* NEGATIVE Final  . Leukocytes, UA 08/25/2013 NEGATIVE  NEGATIVE Final  . Lipase 08/25/2013 18  11 - 59 U/L Final  . Squamous Epithelial / LPF 08/25/2013 FEW* RARE Final  . WBC, UA 08/25/2013 3-6  <3 WBC/hpf Final  . RBC / HPF 08/25/2013 3-6  <3 RBC/hpf Final  . Bacteria, UA 08/25/2013 MANY* RARE Final  . Casts 08/25/2013 HYALINE CASTS* NEGATIVE Final   GRANULAR CAST  . Prothrombin Time 08/25/2013 13.0  11.6 - 15.2 seconds Final  . INR 08/25/2013 1.00  0.00 - 1.49 Final  . WBC 08/26/2013 5.3  4.0 - 10.5 K/uL Final  . RBC 08/26/2013 3.20* 3.87 - 5.11 MIL/uL Final  . Hemoglobin 08/26/2013 8.9* 12.0 - 15.0 g/dL Final   Comment: DELTA CHECK NOTED                          RESULT REPEATED AND VERIFIED  . HCT 08/26/2013 28.5* 36.0 - 46.0 % Final  . MCV 08/26/2013 89.1  78.0 - 100.0 fL Final  . MCH 08/26/2013 27.8  26.0 - 34.0 pg Final  . MCHC 08/26/2013 31.2  30.0 - 36.0 g/dL Final  . RDW 08/26/2013 15.6* 11.5 - 15.5 % Final  . Platelets 08/26/2013 299  150 - 400 K/uL Final   DELTA CHECK NOTED  . CEA 08/26/2013 0.5  0.0 - 5.0 ng/mL Final   Performed at [facility]  . Order Confirmation 08/26/2013 ORDER PROCESSED BY BLOOD BANK   Final  . ABO/RH(D)  08/26/2013 O POS   Final  . Antibody Screen 08/26/2013 NEG   Final  . Sample Expiration 08/26/2013 08/29/2013   Final  . Unit Number 08/26/2013 [id number]   Final  . Blood Component Type 08/26/2013 RED CELLS,LR   Final  . Unit division 08/26/2013 00   Final  . Status of Unit 08/26/2013 ISSUED,FINAL   Final  . Transfusion Status 08/26/2013 OK TO TRANSFUSE   Final  . Crossmatch Result 08/26/2013 Compatible   Final  . Unit Number 08/26/2013 [id number]   Final  . Blood Component Type 08/26/2013 RBC LR PHER1   Final  . Unit division 08/26/2013 00   Final  . Status of Unit 08/26/2013 REL FROM [facility]   Final  . Transfusion Status 08/26/2013 OK TO TRANSFUSE   Final  . Crossmatch Result 08/26/2013 Compatible   Final  . Unit Number 08/26/2013 [id number]   Final  . Blood Component Type 08/26/2013 RED CELLS,LR   Final  . Unit division 08/26/2013 00   Final  . Status of Unit 08/26/2013 REL FROM [facility]   Final  . Transfusion Status 08/26/2013 OK TO TRANSFUSE   Final  . Crossmatch Result 08/26/2013 Compatible   Final  . ABO/RH(D) 08/26/2013 O POS   Final  . WBC 08/27/2013 5.1  4.0 - 10.5 K/uL Final  . RBC 08/27/2013 3.26* 3.87 - 5.11 MIL/uL Final  . Hemoglobin 08/27/2013 9.0* 12.0 - 15.0 g/dL Final  . HCT 08/27/2013 28.7* 36.0 - 46.0 % Final  . MCV 08/27/2013 88.0  78.0 - 100.0 fL Final  . [facility] 08/27/2013  27.6  26.0 - 34.0 pg Final  . MCHC 08/27/2013 31.4  30.0 - 36.0 g/dL Final  . RDW 08/27/2013 15.5  11.5 - 15.5 % Final  . Platelets 08/27/2013 298  150 - 400 K/uL Final  . MRSA, PCR 08/27/2013 NEGATIVE  NEGATIVE Final  . Staphylococcus aureus 08/27/2013 NEGATIVE  NEGATIVE Final   Comment:                                 The Xpert SA Assay (FDA                          approved for NASAL specimens                          in patients over 21 years of age),                          is one component of                          a comprehensive surveillance                          program.   Test performance has                          been validated by [facility] for patients greater                          than or equal to 1 year old.                          It is not intended                          to diagnose infection nor to                          guide or monitor treatment.  . Hemoglobin 08/27/2013 12.0  12.0 - 15.0 g/dL Final   POST TRANSFUSION SPECIMEN  . HCT 08/27/2013 37.0  36.0 - 46.0 % Final  . Sodium 08/28/2013 142  137 - 147 mEq/L Final  . Potassium 08/28/2013 3.8  3.7 - 5.3 mEq/L Final  . Chloride 08/28/2013 104  96 - 112 mEq/L Final  . CO2 08/28/2013 28  19 - 32 mEq/L Final  . Glucose, Bld 08/28/2013 121* 70 - 99 mg/dL Final  . BUN 08/28/2013 9  6 - 23 mg/dL Final  . Creatinine, Ser 08/28/2013 0.81  0.50 - 1.10 mg/dL Final  . Calcium 08/28/2013 7.6* 8.4 - 10.5 mg/dL Final  . GFR calc non Af Amer 08/28/2013 67* >90 mL/min Final  . GFR calc Af Amer 08/28/2013 78* >90 mL/min Final   Comment: (NOTE)                          The eGFR has been calculated using the CKD   EPI equation.                          This calculation has not been validated in all clinical situations.                          eGFR's persistently <90 mL/min signify possible Chronic Kidney                          Disease.  . Magnesium 08/28/2013 1.7  1.5 - 2.5 mg/dL Final  . Phosphorus 08/28/2013 4.1  2.3 - 4.6 mg/dL Final  . WBC 08/28/2013 12.0* 4.0 - 10.5 K/uL Final  . RBC 08/28/2013 3.98  3.87 - 5.11 MIL/uL Final  . Hemoglobin 08/28/2013 11.3* 12.0 - 15.0 g/dL Final  . HCT 08/28/2013 34.9* 36.0 - 46.0 % Final  . MCV 08/28/2013 87.7  78.0 - 100.0 fL Final  . MCH 08/28/2013 28.4  26.0 - 34.0 pg Final  . MCHC 08/28/2013 32.4  30.0 - 36.0 g/dL Final  . RDW 08/28/2013 15.5  11.5 - 15.5 % Final  . Platelets 08/28/2013 338  150 - 400 K/uL Final  . WBC 08/29/2013 9.3  4.0 - 10.5 K/uL Final  . RBC 08/29/2013 3.65* 3.87 - 5.11 MIL/uL Final  . Hemoglobin  08/29/2013 10.0* 12.0 - 15.0 g/dL Final  . HCT 08/29/2013 32.2* 36.0 - 46.0 % Final  . MCV 08/29/2013 88.2  78.0 - 100.0 fL Final  . MCH 08/29/2013 27.4  26.0 - 34.0 pg Final  . MCHC 08/29/2013 31.1  30.0 - 36.0 g/dL Final  . RDW 08/29/2013 15.5  11.5 - 15.5 % Final  . Platelets 08/29/2013 317  150 - 400 K/uL Final  . Sodium 08/29/2013 138  137 - 147 mEq/L Final  . Potassium 08/29/2013 3.7  3.7 - 5.3 mEq/L Final  . Chloride 08/29/2013 101  96 - 112 mEq/L Final  . CO2 08/29/2013 30  19 - 32 mEq/L Final  . Glucose, Bld 08/29/2013 116* 70 - 99 mg/dL Final  . BUN 08/29/2013 10  6 - 23 mg/dL Final  . Creatinine, Ser 08/29/2013 0.75  0.50 - 1.10 mg/dL Final  . Calcium 08/29/2013 7.9* 8.4 - 10.5 mg/dL Final  . GFR calc non Af Amer 08/29/2013 78* >90 mL/min Final  . GFR calc Af Amer 08/29/2013 >90  >90 mL/min Final   Comment: (NOTE)                          The eGFR has been calculated using the CKD EPI equation.                          This calculation has not been validated in all clinical situations.                          eGFR's persistently <90 mL/min signify possible Chronic Kidney                          Disease.  . Magnesium 08/29/2013 1.9  1.5 - 2.5 mg/dL Final  . Phosphorus 08/29/2013 2.2* 2.3 - 4.6 mg/dL Final    Urinalysis    Component Value Date/Time   COLORURINE YELLOW 08/25/2013 1212   APPEARANCEUR HAZY* 08/25/2013 1212     LABSPEC >1.030* 08/25/2013 1212   PHURINE 6.0 08/25/2013 1212   GLUCOSEU NEGATIVE 08/25/2013 1212   HGBUR NEGATIVE 08/25/2013 1212   BILIRUBINUR SMALL* 08/25/2013 1212   KETONESUR TRACE* 08/25/2013 1212   PROTEINUR 30* 08/25/2013 1212   UROBILINOGEN 1.0 08/25/2013 1212   NITRITE POSITIVE* 08/25/2013 1212   LEUKOCYTESUR NEGATIVE 08/25/2013 1212      _0 : Dg Chest 2 View  08/26/2013   CLINICAL DATA:  : Mass.  Nausea.  EXAM: CHEST  2 VIEW  COMPARISON:  None.  FINDINGS: Heart is normal size. Lungs are clear. No effusions. Degenerative changes in the thoracic  spine. No acute bony abnormality.  IMPRESSION: No acute cardiopulmonary disease.   Electronically Signed   By: Rolm Baptise M.D.   On: 08/26/2013 10:53   Ct Abdomen Pelvis W Contrast  08/25/2013   CLINICAL DATA:  Generalized abdominal pain  EXAM: CT ABDOMEN AND PELVIS WITH CONTRAST  TECHNIQUE: Multidetector CT imaging of the abdomen and pelvis was performed using the standard protocol following bolus administration of intravenous contrast.  CONTRAST:  6m OMNIPAQUE IOHEXOL 300 MG/ML SOLN, 1093mOMNIPAQUE IOHEXOL 300 MG/ML SOLN  COMPARISON:  None.  FINDINGS: Lung bases are unremarkable. Mild dextroscoliosis lower thoracic and lumbar spine. Degenerative changes lumbar spine.  Enhanced liver is unremarkable. No calcified gallstones are noted within gallbladder.  The pancreas spleen and adrenal glands are unremarkable.  Kidneys are symmetrical in size and enhancement. Multiple large parapelvic cysts are noted right kidney the largest in midpole measures 3.1 cm. Parapelvic cysts are noted within left kidney the largest in lower pole measures 1.9 cm.  Delayed renal images shows bilateral renal symmetrical excretion. Bilateral visualized proximal ureter is unremarkable. Atherosclerotic calcifications of abdominal aorta and iliac arteries.  Mild distended distal small bowel loops with fluid. No any contrast material noted in distal small bowel loops or within colon. Findings highly suspicious for partial small bowel obstruction or significant ileus.  There is distension of the proximal right colon with fluid. In axial image 43 there is apple core lesion in right colon inferior to hepatic flexure. This is highly suspicious for a malignant stricture. This is best visualized in coronal image 35. Further correlation with colonoscopy is recommended.  The terminal ileum is unremarkable.  The patient is status post appendectomy. Trace fluid is noted just inferior to the cecum see coronal image 30.  Sigmoid colon diverticula are  noted without evidence of acute diverticulitis PE  The transverse colon, left colon and sigmoid colon are smaller caliber.  Atherosclerotic calcifications of abdominal aorta and iliac arteries. Atherosclerotic calcifications bilateral renal artery origin. No aortic aneurysm.  IMPRESSION: 1. There is apple core lesion in right colon measures about 2.6 x 2.2 cm. This is highly suspicious for malignant colonic stricture. Further evaluation with colonoscopy is recommended. Mild distended of right colon proximal to the stricture. There is distension of distal small bowel loops with fluid. Findings suspicious for significant ileus or partial small bowel obstruction. 2. No ascites or free air. 3. Atherosclerotic calcifications of abdominal aorta and iliac arteries. Atherosclerotic calcifications are noted bilateral renal artery origin. 4. No hydronephrosis or hydroureter. Bilateral large parapelvic cysts. These results were called by telephone at the time of interpretation on 08/25/2013 at 12:48 PM to Dr. DeStark Jock who verbally acknowledged these results.   Electronically Signed   By: LiLahoma Crocker.D.   On: 08/25/2013 12:50    PATHOLOGY:  for LAJONALYN, SEDLAKS([id number]: LAAUBURN, HESTERollected: 08/27/2013 Client: [facility]  Accession: [id number] Received: 08/30/2013 Mark Jenkins DOB: 10/07/1931 Age: 81 Gender: F Reported: 09/01/2013 [address] Patient Ph: [phone] MRN #: [id number] [place], [place] [address] Visit #: Chart #: Phone: [phone] Fax: CC: REPORT OF SURGICAL PATHOLOGY ADDITIONAL INFORMATION: Mismatch Repair (MMR) Protein Immunohistochemistry (IHC) IHC Expression Result: MLH1: Preserved nuclear expression (greater 50% tumor expression) MSH2: Preserved nuclear expression (greater 50% tumor expression) MSH6: Preserved nuclear expression (greater 50% tumor expression) PMS2: Preserved nuclear expression (greater 50% tumor expression) * Internal control demonstrates intact nuclear  expression Interpretation: NORMAL There is preserved expression of the major and minor MMR proteins. There is a very low probability that microsatellite instability (MSI) is present. However, certain clinically significant MMR protein mutations may result in preservation of nuclear expression. It is recommended that the preservation of protein expression be correlated with molecular based MSI testing. References: 1. Guidelines on Genetic Evaluation and Management of Lynch Syndrome: A Consensus Statement by the [place] Multi-Society Task Force on Colorectal Cancer Francis M. Giardiello , MD, and others . Am J Gastroenterol 2014; 109:1159-1179; doi: 10.1038/ajg.2014.186; published online 08 December 2012 2. Outcomes of screening endometrial cancer patients for Lynch syndrome by patient-administered checklist. Daniels MS, and others. Gynecol Oncol 2013;131(3):619-623. BASSAM SMIR MD Pathologist, Electronic Signature ( Signed 09/07/2013) FINAL DIAGNOSIS 1 of 3 FINAL for Braver, Kynsleigh ([id number]) Diagnosis Colon, segmental resection for tumor, ascending colon - INVASIVE COLORECTAL ADENOCARCINOMA, 4 CM EXTENDING INTO PERICOLONIC CONNECTIVE TISSUE. - MARGINS NOT INVOLVED. - TWELVE BENIGN LYMPH NODES (0/12). Microscopic Comment COLON AND RECTUM (INCLUDING TRANS-ANAL RESECTION): Specimen: Ascending colon. Procedure: Segmental resection. Tumor site: Ascending colon. Specimen integrity: Intact. Macroscopic intactness of mesorectum: N/A Macroscopic tumor perforation: No. Invasive tumor: Maximum size: 4 cm Histologic type(s): Colorectal adenocarcinoma Histologic grade and differentiation: G2 Type of polyp in which invasive carcinoma arose: No residual adenoma present. Microscopic extension of invasive tumor: Into pericolonic. Lymph-Vascular invasion: Present. Peri-neural invasion: No. Tumor deposit(s) (discontinuous extramural extension): No. Resection margins: Proximal margin: Free of  tumor Distal margin: Free of tumor Circumferential (radial) (posterior ascending, posterior descending; lateral and posterior mid-rectum; and entire lower 1/3 rectum): Free of tumor Mesenteric margin (sigmoid and transverse): N/A Distance closest margin (if all above margins negative): Circumferential 0.5 cm. Treatment effect (neo-adjuvant therapy): No. Additional polyp(s): No. Non-neoplastic findings: None. Lymph nodes: number examined- 12; number positive: 0 Pathologic Staging: pT3, pN0, pMX Ancillary studies: Can be performed if requested. (JDP:gt, 08/31/13) JOHN PATRICK MD Pathologist, Electronic Signature (Case signed 09/01/2013) Specimen Gross and Clinical Information Specimen(s) Obtained: Colon, segmental resection for tumor, ascending colon Specimen Clinical Information ascending colon neoplasm (kp) Gross Specimen: Right colon including portion of terminal ileum. Appendix is absent. Specimen integrity: Intact. Specimen length: There are 9 cm of terminal ileum and the right colon is 15.5 cm from cecum to distal 2 of 3 FINAL for Marcil, Mariachristina ([id number]) Gross(continued) margin. Mesorectal intactness: N/A Tumor location: Ascending colon. Tumor size: There is a 3.5 cm in length and 4 cm width tan red firm sessile mass. Percent of bowel circumference involved: 100%. Tumor distance to margins: Proximal: 14 cm Distal: 2 cm Radial (posterior ascending, posterior descending; lateral and posterior mid-rectum; and entire lower 1/3 rectum): 0.5 cm to the non peritonealized soft tissue margin. Macroscopic extent of tumor invasion: The tumor invades through the muscularis, and into underlying fat. Total presumed lymph nodes: Seventeen possible lymph nodes ranging from 0.2 to 0.7 cm. Extramural satellite tumor nodules: None. Mucosal polyp(s): None. Additional findings: None. Block summary: A= proximal margin. B= distal margin.   C= tumor invading into fat. D,E= tumor and nearest  radial margin. F= tumor with adjacent mucosa. G= tissue for molecular testing. H= four nodes, whole. I= five nodes, whole. J= four nodes, whole. K= four nodes, whole. Total 11 blocks. (SW:gt, 08/30/13) Stain(s) used in Diagnosis: The following stain(s) were used in diagnosing the case: CK AE1AE3, CD 68. The control(s) stained appropriately. Disclaimer Some of these immunohistochemical stains may have been developed and the performance characteristics determined by [facility]. Some may not have been cleared or approved by the U.S. Food and Drug Administration. The FDA has determined that such clearance or approval is not necessary. This test is used for clinical purposes. It should not be regarded as investigational or for research. This laboratory is certified under the [place] (CLIA-88) as qualified to perform high complexity clinical laboratory testing. Report signed out from the following location(s) Technical Component performed at [facility]. [address],[place] [address].[id number]., Technical Component performed at [facility] [address], [place], [place] [address]. CLIA #: [id number], Technical Component performed at [facility], [place], [place] [address]. CLIA #: [id number], Interpretation performed at [facility] [address], [place], [place] [address]. CLIA #: [id number],     IMPRESSION:  #1. Stage II-A adenocarcinoma of the cecum, status post right hemicolectomy 08/27/2013,  12 lymph nodes negative, microsatellite stable, tumor currently undergoing Oncotype DX colon testing for prognosis. 85% of patients with such disease are cured with surgery alone. The estimated prognosis from Oncotype DX is being determined with the patient finally making a decision as to whether or not she wants treatment after  recurrence score is obtained. Risk of relapse is anywhere from 2-15%. Preoperative CEA was normal at 0.5. #2. Chronic blood loss anemia.   PLAN:  #1. Followup in 3 weeks for discussion of Oncotype DX colon recurrence score.  I appreciate the opportunity of sharing in her care.   Farrel Gobble, MD 09/15/2013 4:02 PM   DISCLAIMER:  This note was dictated with voice recognition softwre.  Similar sounding words can inadvertently be transcribed inaccurately and may not be corrected upon review.

## 2013-09-15 NOTE — Patient Instructions (Signed)
[facility] Discharge Instructions  RECOMMENDATIONS MADE BY THE CONSULTANT AND ANY TEST RESULTS WILL BE SENT TO YOUR REFERRING PHYSICIAN.  EXAM FINDINGS BY THE PHYSICIAN TODAY AND SIGNS OR SYMPTOMS TO REPORT TO CLINIC OR PRIMARY PHYSICIAN: Exam and findings as discussed by Dr. Barnet Glasgow.  INSTRUCTIONS/FOLLOW-UP: 1.  Please return in 3 weeks as scheduled for an office visit. 2.  Dr. Barnet Glasgow will contact you prior to then when your Oncotype results are finalized.  Thank you for choosing [facility] to provide your oncology and hematology care.  To afford each patient quality time with our providers, please arrive at least 15 minutes before your scheduled appointment time.  With your help, our goal is to use those 15 minutes to complete the necessary work-up to ensure our physicians have the information they need to help with your evaluation and healthcare recommendations.    Effective January 1st, 2014, we ask that you re-schedule your appointment with our physicians should you arrive 10 or more minutes late for your appointment.  We strive to give you quality time with our providers, and arriving late affects you and other patients whose appointments are after yours.    Again, thank you for choosing [facility].  Our hope is that these requests will decrease the amount of time that you wait before being seen by our physicians.       _____________________________________________________________  Should you have questions after your visit to [facility], please contact our office at [phone] between the hours of 8:30 a.m. and 5:00 p.m.  Voicemails left after 4:30 p.m. will not be returned until the following business day.  For prescription refill requests, have your pharmacy contact our office with your prescription refill request.

## 2013-09-16 LAB — CEA

## 2013-09-21 DIAGNOSIS — C189 Malignant neoplasm of colon, unspecified: Secondary | ICD-10-CM | POA: Diagnosis not present

## 2013-09-23 ENCOUNTER — Telehealth (HOSPITAL_COMMUNITY): Payer: Self-pay

## 2013-09-23 NOTE — Telephone Encounter (Signed)
Message copied by Mellissa Kohut on Thu Sep 23, 2013  4:07 PM ------      Message from: [place], [place]: Wed Sep 22, 2013  7:47 AM       Please call patient to change her appointment from 10/07/2013 to sooner.  I have received the Oncotype result on her colon cancer and am prepared to offer her choices of therapy.  Please arrange at her convenience and my schedule.  Thanks.Dr.F ------

## 2013-09-23 NOTE — Telephone Encounter (Signed)
Patient notified and will be coming on 5/14 @ 9:30am.

## 2013-09-30 ENCOUNTER — Encounter (HOSPITAL_COMMUNITY): Payer: Medicare Other | Attending: Hematology and Oncology

## 2013-09-30 ENCOUNTER — Encounter (HOSPITAL_COMMUNITY): Payer: Self-pay

## 2013-09-30 DIAGNOSIS — D500 Iron deficiency anemia secondary to blood loss (chronic): Secondary | ICD-10-CM | POA: Diagnosis not present

## 2013-09-30 DIAGNOSIS — D649 Anemia, unspecified: Secondary | ICD-10-CM

## 2013-09-30 DIAGNOSIS — C180 Malignant neoplasm of cecum: Secondary | ICD-10-CM | POA: Diagnosis not present

## 2013-09-30 DIAGNOSIS — C189 Malignant neoplasm of colon, unspecified: Secondary | ICD-10-CM | POA: Insufficient documentation

## 2013-09-30 MED ORDER — CAPECITABINE 500 MG PO TABS: ORAL_TABLET | ORAL | Status: DC

## 2013-09-30 NOTE — Progress Notes (Signed)
[facility]  OFFICE PROGRESS NOTE  No PCP Per Patient No address on file  DIAGNOSIS: Colon cancer  Anemia  Chief Complaint  Patient presents with  . Follow-up  . Stage II a colon cancer    CURRENT THERAPY: Completing workup for stage II colon cancer.  INTERVAL HISTORY: [name] [name] 80 y.o. female returns for followup after additional molecular biological testing for stage II colon cancer.  She continues to do well with near-normal bowel movements. She denies any fever, night sweats, abdominal pain, lower extremity swelling or redness, PND, orthopnea, palpitations, cough, shortness of breath, skin rash, headache, or seizures. MEDICAL HISTORY: Past Medical History  Diagnosis Date  . Medical history non-contributory   . Headache(784.0)     occassional headaches  . Arthritis     hands  . Colon cancer     INTERIM HISTORY: has Small bowel obstruction; Tachycardia; Lesion of colon; Anemia; SBO (small bowel obstruction); and Colon cancer on her problem list.    ALLERGIES:  has No Known Allergies.  MEDICATIONS: has a current medication list which includes the following prescription(s): capecitabine, docusate sodium, and hydrocodone-acetaminophen.  SURGICAL HISTORY:  Past Surgical History  Procedure Laterality Date  . Abdominal hysterectomy    . Appendectomy    . Intraocular lens implant, secondary Bilateral 2007    bilateral  . Tonsillectomy    . Colonoscopy N/A 08/26/2013    Procedure: COLONOSCOPY;  Surgeon: Rogene Houston, MD;  Location: [facility];  Service: Endoscopy;  Laterality: N/A;  . Partial colectomy N/A 08/27/2013    Procedure: RIGHT HEMICOLECTOMY;  Surgeon: Jamesetta So, MD;  Location: [facility] ORS;  Service: General;  Laterality: N/A;    FAMILY HISTORY: family history includes Cancer in her brother. There is no history of Colon cancer.  SOCIAL HISTORY:  reports that she has quit smoking. Her smoking use included  Cigarettes. She has a 7.5 pack-year smoking history. She has never used smokeless tobacco. She reports that she does not drink alcohol or use illicit drugs.  REVIEW OF SYSTEMS:  Other than that discussed above is noncontributory.  PHYSICAL EXAMINATION: ECOG PERFORMANCE STATUS: 0 - Asymptomatic  There were no vitals taken for this visit.  GENERAL:alert, no distress and comfortable SKIN: skin color, texture, turgor are normal, no rashes or significant lesions EYES: PERLA; Conjunctiva are pink and non-injected, sclera clear SINUSES: No redness or tenderness over maxillary or ethmoid sinuses OROPHARYNX:no exudate, no erythema on lips, buccal mucosa, or tongue. NECK: supple, thyroid normal size, non-tender, without nodularity. No masses CHEST: Normal AP diameter with no breast masses. LYMPH:  no palpable lymphadenopathy in the cervical, axillary or inguinal LUNGS: clear to auscultation and percussion with normal breathing effort HEART: regular rate & rhythm and no murmurs. ABDOMEN:abdomen soft, non-tender and normal bowel sounds. Surgical wounds well healed. MUSCULOSKELETAL:no cyanosis of digits and no clubbing. Range of motion normal.  NEURO: alert & oriented x 3 with fluent speech, no focal motor/sensory deficits   LABORATORY DATA: Office Visit on 09/15/2013  Component Date Value Ref Range Status  . WBC 09/15/2013 5.4  4.0 - 10.5 K/uL Final  . RBC 09/15/2013 4.01  3.87 - 5.11 MIL/uL Final  . Hemoglobin 09/15/2013 11.1* 12.0 - 15.0 g/dL Final  . HCT 09/15/2013 34.7* 36.0 - 46.0 % Final  . MCV 09/15/2013 86.5  78.0 - 100.0 fL Final  . MCH 09/15/2013 27.7  26.0 - 34.0 pg Final  . MCHC 09/15/2013  32.0  30.0 - 36.0 g/dL Final  . RDW 09/15/2013 15.6* 11.5 - 15.5 % Final  . Platelets 09/15/2013 354  150 - 400 K/uL Final  . Neutrophils Relative % 09/15/2013 48  43 - 77 % Final  . Neutro Abs 09/15/2013 2.6  1.7 - 7.7 K/uL Final  . Lymphocytes Relative 09/15/2013 37  12 - 46 % Final  .  Lymphs Abs 09/15/2013 2.0  0.7 - 4.0 K/uL Final  . Monocytes Relative 09/15/2013 13* 3 - 12 % Final  . Monocytes Absolute 09/15/2013 0.7  0.1 - 1.0 K/uL Final  . Eosinophils Relative 09/15/2013 2  0 - 5 % Final  . Eosinophils Absolute 09/15/2013 0.1  0.0 - 0.7 K/uL Final  . Basophils Relative 09/15/2013 0  0 - 1 % Final  . Basophils Absolute 09/15/2013 0.0  0.0 - 0.1 K/uL Final  . Sodium 09/15/2013 143  137 - 147 mEq/L Final  . Potassium 09/15/2013 4.3  3.7 - 5.3 mEq/L Final  . Chloride 09/15/2013 103  96 - 112 mEq/L Final  . CO2 09/15/2013 29  19 - 32 mEq/L Final  . Glucose, Bld 09/15/2013 96  70 - 99 mg/dL Final  . BUN 09/15/2013 17  6 - 23 mg/dL Final  . Creatinine, Ser 09/15/2013 0.81  0.50 - 1.10 mg/dL Final  . Calcium 09/15/2013 8.7  8.4 - 10.5 mg/dL Final  . Total Protein 09/15/2013 7.1  6.0 - 8.3 g/dL Final  . Albumin 09/15/2013 2.6* 3.5 - 5.2 g/dL Final  . AST 09/15/2013 19  0 - 37 U/L Final  . ALT 09/15/2013 11  0 - 35 U/L Final  . Alkaline Phosphatase 09/15/2013 110  39 - 117 U/L Final  . Total Bilirubin 09/15/2013 0.2* 0.3 - 1.2 mg/dL Final  . GFR calc non Af Amer 09/15/2013 67* >90 mL/min Final  . GFR calc Af Amer 09/15/2013 78* >90 mL/min Final   Comment: (NOTE)                          The eGFR has been calculated using the CKD EPI equation.                          This calculation has not been validated in all clinical situations.                          eGFR's persistently <90 mL/min signify possible Chronic Kidney                          Disease.  . CEA 09/15/2013 <0.5  0.0 - 5.0 ng/mL Final   Performed at [facility]  . Ferritin 09/15/2013 49  10 - 291 ng/mL Final   Performed at [place]:   Oncotype DX colon recurrence score is 27 indicating a recurrence risk of 15% which drops to 10% receiving 5-FU chemotherapy. An additional 2% benefit can be obtained using 5-FU and oxaliplatin.    Urinalysis    Component Value  Date/Time   COLORURINE YELLOW 08/25/2013 1212   APPEARANCEUR HAZY* 08/25/2013 1212   LABSPEC >1.030* 08/25/2013 1212   PHURINE 6.0 08/25/2013 1212   GLUCOSEU NEGATIVE 08/25/2013 1212   HGBUR NEGATIVE 08/25/2013 1212   BILIRUBINUR SMALL* 08/25/2013 1212   KETONESUR TRACE* 08/25/2013 1212   PROTEINUR 30* 08/25/2013 1212  UROBILINOGEN 1.0 08/25/2013 1212   NITRITE POSITIVE* 08/25/2013 [place] 08/25/2013 1212    RADIOGRAPHIC STUDIES: No results found.  ASSESSMENT:  #1. Stage II-A adenocarcinoma of the cecum, status post right hemicolectomy 08/27/2013, 12 lymph nodes negative, microsatellite stable, tumor currently undergoing Oncotype DX colon testing for prognosis. 85% of patients with such disease are cured with surgery alone. Oncotype DX colon recurrence score is 27 indicating a 15% chance of recurrence without additional treatment. #2. Chronic blood loss anemia.    PLAN:  #1. Patient in the presence of her daughter was given information about Xeloda today at 1000 TWICE a day with food for 7 days on and 7 days off for 6 months in an effort to decrease the likelihood of recurrence to 10% versus 15%. #2. Appointment was made with nurse navigator for discussion of the potential benefits and toxicities of this intervention. #3. A tentative appointment was given in 4 weeks but the patient decided not to be treated, GI followup is most critical, not oncologic   All questions were answered. The patient knows to call the clinic with any problems, questions or concerns. We can certainly see the patient much sooner if necessary.   I spent 25 minutes counseling the patient face to face. The total time spent in the appointment was 30 minutes.    Farrel Gobble, MD 09/30/2013 9:42 AM  DISCLAIMER:  This note was dictated with voice recognition software.  Similar sounding words can inadvertently be transcribed inaccurately and may not be corrected upon review.

## 2013-09-30 NOTE — Patient Instructions (Signed)
[place] Discharge Instructions  RECOMMENDATIONS MADE BY THE CONSULTANT AND ANY TEST RESULTS WILL BE SENT TO YOUR REFERRING PHYSICIAN.  EXAM FINDINGS BY THE PHYSICIAN TODAY AND SIGNS OR SYMPTOMS TO REPORT TO CLINIC OR PRIMARY PHYSICIAN: Exam and findings as discussed by Dr. Barnet Glasgow.   INSTRUCTIONS/FOLLOW-UP:  You have an appointment for chemotherapy teaching (Xeloda) with Hildred Alamin on Wednesday, May 20th at 10:00 AM Return for office visit and lab work in 4 weeks.   Thank you for choosing [facility] to provide your oncology and hematology care.  To afford each patient quality time with our providers, please arrive at least 15 minutes before your scheduled appointment time.  With your help, our goal is to use those 15 minutes to complete the necessary work-up to ensure our physicians have the information they need to help with your evaluation and healthcare recommendations.    Effective January 1st, 2014, we ask that you re-schedule your appointment with our physicians should you arrive 10 or more minutes late for your appointment.  We strive to give you quality time with our providers, and arriving late affects you and other patients whose appointments are after yours.    Again, thank you for choosing [facility].  Our hope is that these requests will decrease the amount of time that you wait before being seen by our physicians.       _____________________________________________________________  Should you have questions after your visit to [facility], please contact our office at [phone] between the hours of 8:30 a.m. and 5:00 p.m.  Voicemails left after 4:30 p.m. will not be returned until the following business day.  For prescription refill requests, have your pharmacy contact our office with your prescription refill request.

## 2013-10-04 NOTE — Patient Instructions (Addendum)
[name] [name]   CHEMOTHERAPY INSTRUCTIONS  Xeloda - Side Effects: diarrhea, hand-foot syndrome (hands/feet can get red/tender/and skin can peel). Avoid friction and hot environments on hands/feet. Wear cotton socks. Lotion twice a day to hands/fingers/feet/toes with Udder cream. Mucositis (inflammation of any mucosal membrane can develop -this can occur in the throat/mouth). Mouth sores, nausea/vomiting, anemia, fatigue can also develop. Take Imodium if diarrhea develops and contact us immediately - we will give you further instructions on how to take your Imodium. Xeloda usually comes with a teaching packet from the mail order/specialty pharmacy that will supply you with the drug that is really informative about diarrhea and hand-foot syndrome. No pregnant, child bearing age people, or animals should come into contact with this drug. Even though this is in pill form - it is still very powerful!!! If you should be instructed to discontinue taking this drug, bring the drug into the [facility] and we will dispose of it in a safe manner. Chemotherapy is a biohazard and must be disposed of properly.   Do not touch this pill much. It would be best if the caregiver wore gloves while handling. If the patient is the one handling the medication then you do not have to wear gloves. Do wash your hands after handling medication. Do not put this pill in with the rest of your pills in a pill box. Keep them in a separate pill box. You should take this medication within 30 minutes after eating your am and pm meals. Keep this medication out of the reach of children, pregnant women, or women/men of childbearing age.   Instructions on how to take Xeloda:  Xeloda (capecitabine) 500mg  tablet. Take 2 tablets in the am and 2 tablets in the pm for 7 days in a row followed by a 7 day rest period (don't take the medication). You will do this for 6 months.    EDUCATIONAL MATERIALS GIVEN AND  REVIEWED: Chemotherapy and You  Specific Instructions Sheets: Xeloda   SELF CARE ACTIVITIES WHILE ON CHEMOTHERAPY: Increase your fluid intake 48 hours prior to treatment and drink at least 2 quarts per day after treatment., No alcohol intake., No aspirin or other medications unless approved by your oncologist., Eat foods that are light and easy to digest., Eat foods at cold or room temperature., No fried, fatty, or spicy foods immediately before or after treatment., Have teeth cleaned professionally before starting treatment. Keep dentures and partial plates clean., Use soft toothbrush and do not use mouthwashes that contain alcohol. Biotene is a good mouthwash that is available at most pharmacies or may be ordered by calling [phone]., Use warm salt water gargles (1 teaspoon salt per 1 quart warm water) before and after meals and at bedtime. Or you may rinse with 2 tablespoons of three -percent hydrogen peroxide mixed in eight ounces of water., Always use sunscreen with SPF (Sun Protection Factor) of 30 or higher., Use your nausea medication as directed to prevent nausea., Use your stool softener or laxative as directed to prevent constipation. and Use your anti-diarrheal medication as directed to stop diarrhea.  Please wash your hands for at least 30 seconds using warm soapy water. Handwashing is the #1 way to prevent the spread of germs. Stay away from sick people or people who are getting over a cold. If you develop respiratory systems such as green/yellow mucus production or productive cough or persistent cough let us know and we will see if you need an  antibiotic. It is a good idea to keep a pair of gloves on when going into grocery stores/[employer] to decrease your risk of coming into contact with germs on the carts, etc. Carry alcohol hand gel with you at all times and use it frequently if out in public. All foods need to be cooked thoroughly. No raw foods. No medium or undercooked meats, eggs.  If your food is cooked medium well, it does not need to be hot pink or saturated with bloody liquid at all. Vegetables and fruits need to be washed/rinsed under the faucet with a dish detergent before being consumed. You can eat raw fruits and vegetables unless we tell you otherwise but it would be best if you cooked them or bought frozen. Do not eat off of salad bars or hot bars unless you really trust the cleanliness of the restaurant. If you need dental work, please let us know before you go for your appointment so that we can coordinate the best possible time for you in regards to your chemo regimen. You need to also let your dentist know that you are actively taking chemo. We may need to do labs prior to your dental appointment. We also want your bowels moving at least every other day. If this is not happening, we need to know so that we can get you on a bowel regimen to help you go.       MEDICATIONS: You have been given prescriptions for the following medications:  Over-the-Counter Meds:  Colace - this is a stool softener. Take 100mg  capsule 2-6 times a day as needed. If you have to take more than 6 capsules of Colace a day call the [facility].  Senna - this is a mild laxative used to treat mild constipation. May take 2 tabs by mouth daily or up to twice a day as needed for mild constipation.  Milk of Magnesia - this is a laxative used to treat moderate to severe constipation. May take 2-4 tablespoons every 8 hours as needed. May increase to 8 tablespoons x 1 dose and if no bowel movement call the [facility].  Imodium - this is for diarrhea. Take 2 tabs after 1st loose stool and then 1 tab after each loose stool until you go a total of 12 hours without a loose stool. Call [facility] if loose stools continue.    SYMPTOMS TO REPORT AS SOON AS POSSIBLE AFTER TREATMENT:  FEVER GREATER THAN 100.5 F  CHILLS WITH OR WITHOUT FEVER  NAUSEA AND VOMITING THAT IS NOT CONTROLLED WITH  YOUR NAUSEA MEDICATION  UNUSUAL SHORTNESS OF BREATH  UNUSUAL BRUISING OR BLEEDING  TENDERNESS IN MOUTH AND THROAT WITH OR WITHOUT PRESENCE OF ULCERS  URINARY PROBLEMS  BOWEL PROBLEMS  UNUSUAL RASH    Wear comfortable clothing and clothing appropriate for easy access to any Portacath or PICC line. Let us know if there is anything that we can do to make your therapy better!      I have been informed and understand all of the instructions given to me and have received a copy. I have been instructed to call the clinic [phone] or my family physician as soon as possible for continued medical care, if indicated. I do not have any more questions at this time but understand that I may call the [place] or the Patient Navigator at [phone] during office hours should I have questions or need assistance in obtaining follow-up care.  _________________________________________      _______________     __________ Signature of Patient or Authorized Representative        Date                            Time      _________________________________________ Nurse's Signature      Capecitabine tablets What is this medicine? CAPECITABINE (ka pe SITE a been) is a chemotherapy drug. It slows the growth of cancer cells. This medicine is used to treat breast cancer, and also colon or rectal cancer. This medicine may be used for other purposes; ask your health care provider or pharmacist if you have questions. COMMON BRAND NAME(S): Xeloda What should I tell my health care provider before I take this medicine? They need to know if you have any of these conditions: -bleeding or blood disorders -dihydropyrimidine dehydrogenase (DPD) deficiency -heart disease -infection (especially a virus infection such as chickenpox, cold sores, or herpes) -kidney disease -liver disease -an unusual or allergic reaction to capecitabine, 5-fluorouracil, other medicines, foods, dyes, or  preservatives -pregnant or trying to get pregnant -breast-feeding How should I use this medicine? Take this medicine by mouth with a glass of water, within 30 minutes of the end of a meal. Follow the directions on the prescription label. Take your medicine at regular intervals. Do not take it more often than directed. Do not stop taking except on your doctor's advice. Your doctor may want you to take a combination of 150 mg and 500 mg tablets for each dose. It is very important that you know how to correctly take your dose. Taking the wrong tablets could result in an overdose (too much medication) or underdose (too little medication). Talk to your pediatrician regarding the use of this medicine in children. Special care may be needed. Overdosage: If you think you have taken too much of this medicine contact a poison control center or emergency room at once. NOTE: This medicine is only for you. Do not share this medicine with others. What if I miss a dose? If you miss a dose, do not take the missed dose at all. Do not take double or extra doses. Instead, continue with your next scheduled dose and check with your doctor. What may interact with this medicine? -antacids with aluminum and/or magnesium -folic acid -leucovorin -medicines to increase blood counts like filgrastim, pegfilgrastim, sargramostim -phenytoin -vaccines -warfarin Talk to your doctor or health care professional before taking any of these medicines: -acetaminophen -aspirin -ibuprofen -ketoprofen -naproxen This list may not describe all possible interactions. Give your health care provider a list of all the medicines, herbs, non-prescription drugs, or dietary supplements you use. Also tell them if you smoke, drink alcohol, or use illegal drugs. Some items may interact with your medicine. What should I watch for while using this medicine? Visit your doctor for checks on your progress. This drug may make you feel generally  unwell. This is not uncommon, as chemotherapy can affect healthy cells as well as cancer cells. Report any side effects. Continue your course of treatment even though you feel ill unless your doctor tells you to stop. In some cases, you may be given additional medicines to help with side effects. Follow all directions for their use. Call your doctor or health care professional for advice if you get a fever, chills or sore throat, or other symptoms of a cold or flu. Do not treat yourself. This drug decreases  your body's ability to fight infections. Try to avoid being around people who are sick. This medicine may increase your risk to bruise or bleed. Call your doctor or health care professional if you notice any unusual bleeding. Be careful brushing and flossing your teeth or using a toothpick because you may get an infection or bleed more easily. If you have any dental work done, tell your dentist you are receiving this medicine. Avoid taking products that contain aspirin, acetaminophen, ibuprofen, naproxen, or ketoprofen unless instructed by your doctor. These medicines may hide a fever. Do not become pregnant while taking this medicine. Women should inform their doctor if they wish to become pregnant or think they might be pregnant. There is a potential for serious side effects to an unborn child. Talk to your health care professional or pharmacist for more information. Do not breast-feed an infant while taking this medicine. Men are advised not to father a child while taking this medicine. What side effects may I notice from receiving this medicine? Side effects that you should report to your doctor or health care professional as soon as possible: -allergic reactions like skin rash, itching or hives, swelling of the face, lips, or tongue -low blood counts - this medicine may decrease the number of white blood cells, red blood cells and platelets. You may be at increased risk for infections and  bleeding. -signs of infection - fever or chills, cough, sore throat, pain or difficulty passing urine -signs of decreased platelets or bleeding - bruising, pinpoint red spots on the skin, black, tarry stools, blood in the urine -signs of decreased red blood cells - unusually weak or tired, fainting spells, lightheadedness -breathing problems -changes in vision -chest pain -diarrhea of more than 4 bowel movements in one day or any diarrhea at night -mouth sores -nausea and vomiting -pain, swelling, redness at site where injected -pain, tingling, numbness in the hands or feet -redness, swelling, or sores on hands or feet -stomach pain -vomiting -yellow color of skin or eyes Side effects that usually do not require medical attention (report to your doctor or health care professional if they continue or are bothersome): -constipation -diarrhea -dry or itchy skin -hair loss -loss of appetite -nausea -weak or tired This list may not describe all possible side effects. Call your doctor for medical advice about side effects. You may report side effects to FDA at 1-800-FDA-1088. Where should I keep my medicine? Keep out of the reach of children. Store at room temperature between 15 and 30 degrees C (59 and 86 degrees F). Keep container tightly closed. Throw away any unused medicine after the expiration date. NOTE: This sheet is a summary. It may not cover all possible information. If you have questions about this medicine, talk to your doctor, pharmacist, or health care provider.  2014, Elsevier/Gold Standard. (2008-04-18 11:47:41)

## 2013-10-06 ENCOUNTER — Encounter (HOSPITAL_COMMUNITY): Payer: Medicare Other

## 2013-10-06 ENCOUNTER — Other Ambulatory Visit (HOSPITAL_COMMUNITY): Payer: Self-pay

## 2013-10-06 DIAGNOSIS — C189 Malignant neoplasm of colon, unspecified: Secondary | ICD-10-CM

## 2013-10-06 DIAGNOSIS — D649 Anemia, unspecified: Secondary | ICD-10-CM

## 2013-10-07 ENCOUNTER — Ambulatory Visit (HOSPITAL_COMMUNITY): Payer: Medicare Other

## 2013-10-12 NOTE — Progress Notes (Signed)
Patient decided that she did not want to take Xeloda but did want a price check on the Xeloda. Price check @ [facility] was [phone] for a one month supply. Angie is going to run this through with another outside pharmacy to see if the price is better.

## 2013-10-25 ENCOUNTER — Ambulatory Visit (HOSPITAL_COMMUNITY): Payer: Medicare Other

## 2013-10-25 ENCOUNTER — Other Ambulatory Visit (HOSPITAL_COMMUNITY): Payer: Medicare Other

## 2013-11-01 ENCOUNTER — Telehealth (HOSPITAL_COMMUNITY): Payer: Self-pay | Admitting: *Deleted

## 2013-11-01 NOTE — Telephone Encounter (Signed)
I spoke with [name] [name] in Pathology today regarding sending out colon specimen from 08/27/13 to [facility] for testing.

## 2013-11-02 DIAGNOSIS — L570 Actinic keratosis: Secondary | ICD-10-CM | POA: Diagnosis not present

## 2013-11-02 DIAGNOSIS — L719 Rosacea, unspecified: Secondary | ICD-10-CM | POA: Diagnosis not present

## 2013-11-02 DIAGNOSIS — D235 Other benign neoplasm of skin of trunk: Secondary | ICD-10-CM | POA: Diagnosis not present

## 2013-11-02 DIAGNOSIS — L708 Other acne: Secondary | ICD-10-CM | POA: Diagnosis not present

## 2013-11-03 ENCOUNTER — Other Ambulatory Visit (HOSPITAL_COMMUNITY)
Admission: RE | Admit: 2013-11-03 | Discharge: 2013-11-03 | Disposition: A | Discharge status: Home / Self Care | Payer: Medicare Other | Source: Ambulatory Visit | Attending: Hematology and Oncology | Admitting: Hematology and Oncology

## 2013-11-03 DIAGNOSIS — C189 Malignant neoplasm of colon, unspecified: Secondary | ICD-10-CM | POA: Insufficient documentation

## 2013-11-05 ENCOUNTER — Encounter (HOSPITAL_COMMUNITY): Payer: Self-pay

## 2013-11-05 ENCOUNTER — Encounter (HOSPITAL_BASED_OUTPATIENT_CLINIC_OR_DEPARTMENT_OTHER): Payer: Medicare Other

## 2013-11-05 ENCOUNTER — Encounter (HOSPITAL_COMMUNITY): Payer: Medicare Other | Attending: Hematology and Oncology

## 2013-11-05 VITALS — BP 187/75 | HR 75 | Temp 98.4°F | Resp 18 | Wt 141.7 lb

## 2013-11-05 DIAGNOSIS — D649 Anemia, unspecified: Secondary | ICD-10-CM | POA: Diagnosis not present

## 2013-11-05 DIAGNOSIS — C189 Malignant neoplasm of colon, unspecified: Secondary | ICD-10-CM

## 2013-11-05 DIAGNOSIS — C180 Malignant neoplasm of cecum: Secondary | ICD-10-CM

## 2013-11-05 DIAGNOSIS — D500 Iron deficiency anemia secondary to blood loss (chronic): Secondary | ICD-10-CM

## 2013-11-05 LAB — CBC WITH DIFFERENTIAL/PLATELET
BASOS PCT: 0 % (ref 0–1)
Basophils Absolute: 0 10*3/uL (ref 0.0–0.1)
Eosinophils Absolute: 0.3 10*3/uL (ref 0.0–0.7)
Eosinophils Relative: 4 % (ref 0–5)
HCT: 36.6 % (ref 36.0–46.0)
Hemoglobin: 12 g/dL (ref 12.0–15.0)
Lymphocytes Relative: 32 % (ref 12–46)
Lymphs Abs: 2.3 10*3/uL (ref 0.7–4.0)
MCH: 28.9 pg (ref 26.0–34.0)
MCHC: 32.8 g/dL (ref 30.0–36.0)
MCV: 88.2 fL (ref 78.0–100.0)
MONO ABS: 0.8 10*3/uL (ref 0.1–1.0)
Monocytes Relative: 11 % (ref 3–12)
NEUTROS ABS: 3.8 10*3/uL (ref 1.7–7.7)
NEUTROS PCT: 53 % (ref 43–77)
Platelets: 257 10*3/uL (ref 150–400)
RBC: 4.15 MIL/uL (ref 3.87–5.11)
RDW: 17.5 % — ABNORMAL HIGH (ref 11.5–15.5)
WBC: 7.2 10*3/uL (ref 4.0–10.5)

## 2013-11-05 NOTE — Progress Notes (Signed)
LABS DRAWN FOR [facility]. 

## 2013-11-05 NOTE — Progress Notes (Signed)
[place]  OFFICE PROGRESS NOTE  No PCP Per Patient No address on file  DIAGNOSIS: Colon cancer  Iron deficiency anemia due to chronic blood loss  Chief Complaint  Patient presents with  . stage IIA COLON cancer  . Blood loss anemia    CURRENT THERAPY: After discussion with nurse navigator, the patient decided not to take Xeloda adjuvantly.  INTERVAL HISTORY: [name] [name] 80 y.o. female returns for followup after additional consideration regarding the use of adjuvant chemotherapy with Xelod  for stage II-aA colon cancer, microsatellite stable, Oncotype DX BX colon recurrence score of 27 with a 15% risk of relapse without further treatment, representing a 5% decrease in recurrence if Xeloda were taken for 6 months.  After careful consideration the patient has decided not to go on adjuvant therapy. She does have occasional pain pain in the abdomen that last for a few seconds. She's had some difficulty in initiating defecation but ultimately succeeds. She denies any melena, hematochezia, hematuria, nausea, vomiting, sore throat, fever, night sweats, PND, orthopnea, palpitations, dysuria, hematuria, urinary hesitancy, incontinence, skin rash, headache, or seizures. [place]  MEDICAL HISTORY: Past Medical History  Diagnosis Date  . Medical history non-contributory   . Headache(784.0)     occassional headaches  . Arthritis     hands  . Colon cancer     INTERIM HISTORY: has Small bowel obstruction; Tachycardia; Lesion of colon; Anemia; SBO (small bowel obstruction); and Colon cancer on her problem list.    ALLERGIES:  has No Known Allergies.  MEDICATIONS: has a current medication list which includes the following prescription(s): docusate sodium, doxycycline, capecitabine, and hydrocodone-acetaminophen.  SURGICAL HISTORY:  Past Surgical History  Procedure Laterality Date  . Abdominal  hysterectomy    . Appendectomy    . Intraocular lens implant, secondary Bilateral 2007    bilateral  . Tonsillectomy    . Colonoscopy N/A 08/26/2013    Procedure: COLONOSCOPY;  Surgeon: Rogene Houston, MD;  Location: [facility];  Service: Endoscopy;  Laterality: N/A;  . Partial colectomy N/A 08/27/2013    Procedure: RIGHT HEMICOLECTOMY;  Surgeon: Jamesetta So, MD;  Location: [facility] ORS;  Service: General;  Laterality: N/A;    FAMILY HISTORY: family history includes Cancer in her brother. There is no history of Colon cancer.  SOCIAL HISTORY:  reports that she has quit smoking. Her smoking use included Cigarettes. She has a 7.5 pack-year smoking history. She has never used smokeless tobacco. She reports that she does not drink alcohol or use illicit drugs.  REVIEW OF SYSTEMS:  Other than that discussed above is noncontributory.  PHYSICAL EXAMINATION: ECOG PERFORMANCE STATUS: 1 - Symptomatic but completely ambulatory  Blood pressure 187/75, pulse 75, temperature 98.4 F (36.9 C), temperature source Oral, resp. rate 18, weight 141 lb 11.2 oz (64.275 kg).  GENERAL:alert, no distress and comfortable SKIN: skin color, texture, turgor are normal, no rashes or significant lesions EYES: PERLA; Conjunctiva are pink and non-injected, sclera clear SINUSES: No redness or tenderness over maxillary or ethmoid sinuses OROPHARYNX:no exudate, no erythema on lips, buccal mucosa, or tongue. NECK: supple, thyroid normal size, non-tender, without nodularity. No masses CHEST: Normal AP diameter with no breast masses. LYMPH:  no palpable lymphadenopathy in the cervical, axillary or inguinal LUNGS: clear to auscultation and percussion with normal breathing effort HEART: regular rate & rhythm and no murmurs. ABDOMEN:abdomen soft, non-tender and normal bowel  sounds. Well-healed surgical scar with no evidence of hepatomegaly, ascites, or CVA tenderness. MUSCULOSKELETAL:no cyanosis of digits and no clubbing.  Range of motion normal.  NEURO: alert & oriented x 3 with fluent speech, no focal motor/sensory deficits   LABORATORY DATA: Lab on 11/05/2013  Component Date Value Ref Range Status  . WBC 11/05/2013 7.2  4.0 - 10.5 K/uL Final  . RBC 11/05/2013 4.15  3.87 - 5.11 MIL/uL Final  . Hemoglobin 11/05/2013 12.0  12.0 - 15.0 g/dL Final  . HCT 11/05/2013 36.6  36.0 - 46.0 % Final  . MCV 11/05/2013 88.2  78.0 - 100.0 fL Final  . MCH 11/05/2013 28.9  26.0 - 34.0 pg Final  . MCHC 11/05/2013 32.8  30.0 - 36.0 g/dL Final  . RDW 11/05/2013 17.5* 11.5 - 15.5 % Final  . Platelets 11/05/2013 257  150 - 400 K/uL Final  . Neutrophils Relative % 11/05/2013 53  43 - 77 % Final  . Neutro Abs 11/05/2013 3.8  1.7 - 7.7 K/uL Final  . Lymphocytes Relative 11/05/2013 32  12 - 46 % Final  . Lymphs Abs 11/05/2013 2.3  0.7 - 4.0 K/uL Final  . Monocytes Relative 11/05/2013 11  3 - 12 % Final  . Monocytes Absolute 11/05/2013 0.8  0.1 - 1.0 K/uL Final  . Eosinophils Relative 11/05/2013 4  0 - 5 % Final  . Eosinophils Absolute 11/05/2013 0.3  0.0 - 0.7 K/uL Final  . Basophils Relative 11/05/2013 0  0 - 1 % Final  . Basophils Absolute 11/05/2013 0.0  0.0 - 0.1 K/uL Final  . WBC Morphology 11/05/2013 ATYPICAL LYMPHOCYTES   Final    PATHOLOGY: Microsatellite stable adenocarcinoma, Oncotype DX colon score of 27 with a relapse risk of 15%.   Urinalysis    Component Value Date/Time   COLORURINE YELLOW 08/25/2013 1212   APPEARANCEUR HAZY* 08/25/2013 1212   LABSPEC >1.030* 08/25/2013 1212   PHURINE 6.0 08/25/2013 1212   GLUCOSEU NEGATIVE 08/25/2013 1212   HGBUR NEGATIVE 08/25/2013 1212   BILIRUBINUR SMALL* 08/25/2013 1212   KETONESUR TRACE* 08/25/2013 1212   PROTEINUR 30* 08/25/2013 1212   UROBILINOGEN 1.0 08/25/2013 1212   NITRITE POSITIVE* 08/25/2013 1212   LEUKOCYTESUR NEGATIVE 08/25/2013 1212    RADIOGRAPHIC STUDIES: No results found.  ASSESSMENT:  #1. Stage II-A adenocarcinoma of the cecum, status post right hemicolectomy  08/27/2013, 12 lymph nodes negative, microsatellite stable, tumor currently undergoing Oncotype DX colon testing for prognosis. 85% of patients with such disease are cured with surgery alone. Oncotype DX colon recurrence score is 27 indicating a 15% chance of recurrence without additional treatment.  #2. Chronic blood loss anemia, improved.   PLAN:  #1. The patient was reassured. #2. GI followup is critical looking for new polyps and scheduling followup colonoscopies based upon the findings. CBC, chem profile, and CEA should be done every 6 months for 2 years. #3. She was told to call should she develop any new signs or symptoms that are troublesome and persistent. #4. Office visit in 6 months with CBC, chem profile, and CEA with the proviso that she can cancel if she wishes.   All questions were answered. The patient knows to call the clinic with any problems, questions or concerns. We can certainly see the patient much sooner if necessary.   I spent 25 minutes counseling the patient face to face. The total time spent in the appointment was 30 minutes.    Doroteo Bradford, MD 11/05/2013 11:22 AM  DISCLAIMER:  This note was dictated with voice recognition software.  Similar sounding words can inadvertently be transcribed inaccurately and may not be corrected upon review.

## 2013-11-05 NOTE — Patient Instructions (Signed)
[facility] Discharge Instructions  RECOMMENDATIONS MADE BY THE CONSULTANT AND ANY TEST RESULTS WILL BE SENT TO YOUR REFERRING PHYSICIAN.  EXAM FINDINGS BY THE PHYSICIAN TODAY AND SIGNS OR SYMPTOMS TO REPORT TO CLINIC OR PRIMARY PHYSICIAN: Exam and findings as discussed by Dr. Barnet Glasgow.  Will see you back in 6 months.  Report changes in bowel habits, blood in stool, unexplained weight loss, etc.  MEDICATIONS PRESCRIBED:  none  INSTRUCTIONS/FOLLOW-UP: Follow-up in 6 months with labs and office visit.  Thank you for choosing [facility] to provide your oncology and hematology care.  To afford each patient quality time with our providers, please arrive at least 15 minutes before your scheduled appointment time.  With your help, our goal is to use those 15 minutes to complete the necessary work-up to ensure our physicians have the information they need to help with your evaluation and healthcare recommendations.    Effective January 1st, 2014, we ask that you re-schedule your appointment with our physicians should you arrive 10 or more minutes late for your appointment.  We strive to give you quality time with our providers, and arriving late affects you and other patients whose appointments are after yours.    Again, thank you for choosing [facility].  Our hope is that these requests will decrease the amount of time that you wait before being seen by our physicians.       _____________________________________________________________  Should you have questions after your visit to [facility], please contact our office at [phone] between the hours of 8:30 a.m. and 5:00 p.m.  Voicemails left after 4:30 p.m. will not be returned until the following business day.  For prescription refill requests, have your pharmacy contact our office with your prescription refill request.

## 2013-11-16 ENCOUNTER — Encounter (HOSPITAL_COMMUNITY): Payer: Self-pay

## 2013-11-30 ENCOUNTER — Encounter: Payer: Self-pay | Admitting: Hematology and Oncology

## 2014-01-14 ENCOUNTER — Ambulatory Visit (HOSPITAL_COMMUNITY)
Admission: RE | Admit: 2014-01-14 | Discharge: 2014-01-14 | Disposition: A | Discharge status: Home / Self Care | Payer: Medicare Other | Source: Ambulatory Visit | Attending: Internal Medicine | Admitting: Internal Medicine

## 2014-01-14 ENCOUNTER — Other Ambulatory Visit (HOSPITAL_COMMUNITY): Payer: Self-pay | Admitting: Internal Medicine

## 2014-01-14 DIAGNOSIS — C189 Malignant neoplasm of colon, unspecified: Secondary | ICD-10-CM | POA: Diagnosis not present

## 2014-01-14 DIAGNOSIS — M25569 Pain in unspecified knee: Secondary | ICD-10-CM | POA: Insufficient documentation

## 2014-01-14 DIAGNOSIS — M25859 Other specified joint disorders, unspecified hip: Secondary | ICD-10-CM | POA: Diagnosis not present

## 2014-01-14 DIAGNOSIS — F3289 Other specified depressive episodes: Secondary | ICD-10-CM | POA: Diagnosis not present

## 2014-01-14 DIAGNOSIS — M25552 Pain in left hip: Secondary | ICD-10-CM

## 2014-01-14 DIAGNOSIS — F329 Major depressive disorder, single episode, unspecified: Secondary | ICD-10-CM | POA: Diagnosis not present

## 2014-01-14 DIAGNOSIS — M25559 Pain in unspecified hip: Secondary | ICD-10-CM | POA: Diagnosis not present

## 2014-01-18 DIAGNOSIS — C189 Malignant neoplasm of colon, unspecified: Secondary | ICD-10-CM | POA: Diagnosis not present

## 2014-01-18 DIAGNOSIS — Z79899 Other long term (current) drug therapy: Secondary | ICD-10-CM | POA: Diagnosis not present

## 2014-01-18 DIAGNOSIS — F0390 Unspecified dementia without behavioral disturbance: Secondary | ICD-10-CM | POA: Diagnosis not present

## 2014-01-18 DIAGNOSIS — F0393 Unspecified dementia, unspecified severity, with mood disturbance: Secondary | ICD-10-CM | POA: Diagnosis not present

## 2014-01-18 DIAGNOSIS — E039 Hypothyroidism, unspecified: Secondary | ICD-10-CM | POA: Diagnosis not present

## 2014-01-31 ENCOUNTER — Other Ambulatory Visit (HOSPITAL_COMMUNITY): Payer: Medicare Other

## 2014-02-11 DIAGNOSIS — E46 Unspecified protein-calorie malnutrition: Secondary | ICD-10-CM | POA: Diagnosis not present

## 2014-02-11 DIAGNOSIS — Z23 Encounter for immunization: Secondary | ICD-10-CM | POA: Diagnosis not present

## 2014-02-11 DIAGNOSIS — C189 Malignant neoplasm of colon, unspecified: Secondary | ICD-10-CM | POA: Diagnosis not present

## 2014-03-02 DIAGNOSIS — H26493 Other secondary cataract, bilateral: Secondary | ICD-10-CM | POA: Diagnosis not present

## 2014-05-02 ENCOUNTER — Other Ambulatory Visit (HOSPITAL_COMMUNITY): Payer: Medicare Other

## 2014-05-02 ENCOUNTER — Ambulatory Visit (HOSPITAL_COMMUNITY): Payer: Medicare Other

## 2014-05-05 ENCOUNTER — Other Ambulatory Visit (HOSPITAL_COMMUNITY): Payer: Medicare Other

## 2014-05-06 ENCOUNTER — Ambulatory Visit (HOSPITAL_COMMUNITY): Payer: Medicare Other

## 2014-05-07 NOTE — Progress Notes (Signed)
This encounter was created in error - please disregard.

## 2014-05-10 DIAGNOSIS — C189 Malignant neoplasm of colon, unspecified: Secondary | ICD-10-CM | POA: Diagnosis not present

## 2014-05-10 DIAGNOSIS — Z79899 Other long term (current) drug therapy: Secondary | ICD-10-CM | POA: Diagnosis not present

## 2014-05-17 DIAGNOSIS — D509 Iron deficiency anemia, unspecified: Secondary | ICD-10-CM | POA: Diagnosis not present

## 2014-05-17 DIAGNOSIS — C189 Malignant neoplasm of colon, unspecified: Secondary | ICD-10-CM | POA: Diagnosis not present

## 2014-08-18 DIAGNOSIS — Z79899 Other long term (current) drug therapy: Secondary | ICD-10-CM | POA: Diagnosis not present

## 2014-08-18 DIAGNOSIS — D643 Other sideroblastic anemias: Secondary | ICD-10-CM | POA: Diagnosis not present

## 2014-08-18 DIAGNOSIS — I10 Essential (primary) hypertension: Secondary | ICD-10-CM | POA: Diagnosis not present

## 2014-08-18 DIAGNOSIS — D010 Carcinoma in situ of colon: Secondary | ICD-10-CM | POA: Diagnosis not present

## 2014-08-25 ENCOUNTER — Telehealth (INDEPENDENT_AMBULATORY_CARE_PROVIDER_SITE_OTHER): Payer: Self-pay | Admitting: *Deleted

## 2014-08-25 DIAGNOSIS — D509 Iron deficiency anemia, unspecified: Secondary | ICD-10-CM | POA: Diagnosis not present

## 2014-08-25 DIAGNOSIS — C189 Malignant neoplasm of colon, unspecified: Secondary | ICD-10-CM | POA: Diagnosis not present

## 2014-08-25 NOTE — Telephone Encounter (Signed)
Dr. Willey Blade would like to now when [name] [name]'s next TCS is due. Her last was 08/26/13.

## 2014-08-25 NOTE — Telephone Encounter (Signed)
When is TCS due

## 2014-08-25 NOTE — Telephone Encounter (Signed)
Forwarded to Ann to address 

## 2014-08-26 NOTE — Telephone Encounter (Signed)
She should have colonoscopy 1 year after her surgery which means she should have colonoscopy this or next month

## 2014-09-05 ENCOUNTER — Encounter (INDEPENDENT_AMBULATORY_CARE_PROVIDER_SITE_OTHER): Payer: Self-pay | Admitting: *Deleted

## 2014-09-05 NOTE — Telephone Encounter (Signed)
Letter mailed to patient.

## 2014-10-19 ENCOUNTER — Telehealth (INDEPENDENT_AMBULATORY_CARE_PROVIDER_SITE_OTHER): Payer: Self-pay | Admitting: *Deleted

## 2014-10-19 ENCOUNTER — Other Ambulatory Visit (INDEPENDENT_AMBULATORY_CARE_PROVIDER_SITE_OTHER): Payer: Self-pay | Admitting: *Deleted

## 2014-10-19 DIAGNOSIS — Z1211 Encounter for screening for malignant neoplasm of colon: Secondary | ICD-10-CM

## 2014-10-19 DIAGNOSIS — Z85038 Personal history of other malignant neoplasm of large intestine: Secondary | ICD-10-CM

## 2014-10-19 NOTE — Telephone Encounter (Addendum)
Patient needs osmo pill 

## 2014-10-21 MED ORDER — SOD PHOS MONO-SOD PHOS DIBASIC 1.102-0.398 G PO TABS: 32.0000 | ORAL_TABLET | Freq: Once | ORAL | Status: DC

## 2014-10-25 ENCOUNTER — Telehealth (INDEPENDENT_AMBULATORY_CARE_PROVIDER_SITE_OTHER): Payer: Self-pay | Admitting: *Deleted

## 2014-10-25 NOTE — Telephone Encounter (Signed)
Referring MD/PCP: fagan   Procedure: tcs  Reason/Indication:  Hx colon ca  Has patient had this procedure before?  Yes, 08/2013 -- epic  If so, when, by whom and where?    Is there a family history of colon cancer?  no  Who?  What age when diagnosed?    Is patient diabetic?   no      Does patient have prosthetic heart valve?  no  Do you have a pacemaker?  no  Has patient ever had endocarditis? no  Has patient had joint replacement within last 12 months?  no  Does patient tend to be constipated or take laxatives? Sometimes  Is patient on Coumadin, Plavix and/or Aspirin? no  Medications: BC powder prn  Allergies: nlda  Medication Adjustment: BC powder 2 days  Procedure date & time: 11/24/14 at 1200

## 2014-10-25 NOTE — Telephone Encounter (Signed)
agree

## 2014-11-24 ENCOUNTER — Encounter (HOSPITAL_COMMUNITY)
Admission: RE | Disposition: A | Discharge status: Home / Self Care | Payer: Self-pay | Source: Ambulatory Visit | Attending: Internal Medicine

## 2014-11-24 ENCOUNTER — Ambulatory Visit (HOSPITAL_COMMUNITY)
Admission: RE | Admit: 2014-11-24 | Discharge: 2014-11-24 | Disposition: A | Discharge status: Home / Self Care | Payer: Medicare Other | Source: Ambulatory Visit | Attending: Internal Medicine | Admitting: Internal Medicine

## 2014-11-24 ENCOUNTER — Encounter (HOSPITAL_COMMUNITY): Payer: Self-pay

## 2014-11-24 DIAGNOSIS — K5520 Angiodysplasia of colon without hemorrhage: Secondary | ICD-10-CM | POA: Insufficient documentation

## 2014-11-24 DIAGNOSIS — Z87891 Personal history of nicotine dependence: Secondary | ICD-10-CM | POA: Diagnosis not present

## 2014-11-24 DIAGNOSIS — K5730 Diverticulosis of large intestine without perforation or abscess without bleeding: Secondary | ICD-10-CM | POA: Diagnosis not present

## 2014-11-24 DIAGNOSIS — Z85038 Personal history of other malignant neoplasm of large intestine: Secondary | ICD-10-CM | POA: Diagnosis not present

## 2014-11-24 DIAGNOSIS — K648 Other hemorrhoids: Secondary | ICD-10-CM | POA: Insufficient documentation

## 2014-11-24 DIAGNOSIS — M13841 Other specified arthritis, right hand: Secondary | ICD-10-CM | POA: Diagnosis not present

## 2014-11-24 DIAGNOSIS — Z9049 Acquired absence of other specified parts of digestive tract: Secondary | ICD-10-CM | POA: Diagnosis not present

## 2014-11-24 DIAGNOSIS — Q2733 Arteriovenous malformation of digestive system vessel: Secondary | ICD-10-CM | POA: Diagnosis not present

## 2014-11-24 DIAGNOSIS — M13842 Other specified arthritis, left hand: Secondary | ICD-10-CM | POA: Diagnosis not present

## 2014-11-24 DIAGNOSIS — Z08 Encounter for follow-up examination after completed treatment for malignant neoplasm: Secondary | ICD-10-CM | POA: Diagnosis present

## 2014-11-24 HISTORY — PX: COLONOSCOPY: SHX5424

## 2014-11-24 SURGERY — COLONOSCOPY
Anesthesia: Moderate Sedation

## 2014-11-24 MED ORDER — MEPERIDINE HCL 100 MG/ML IJ SOLN
INTRAMUSCULAR | Status: AC
  Filled 2014-11-24: qty 2

## 2014-11-24 MED ORDER — MIDAZOLAM HCL 5 MG/5ML IJ SOLN
INTRAMUSCULAR | Status: DC | PRN
  Administered 2014-11-24: 1 mg via INTRAVENOUS
  Administered 2014-11-24 (×2): 2 mg via INTRAVENOUS

## 2014-11-24 MED ORDER — MEPERIDINE HCL 50 MG/ML IJ SOLN
INTRAMUSCULAR | Status: DC | PRN
  Administered 2014-11-24 (×2): 25 mg via INTRAVENOUS

## 2014-11-24 MED ORDER — LIDOCAINE HCL 2 % EX GEL
CUTANEOUS | Status: DC | PRN
  Administered 2014-11-24: 1

## 2014-11-24 MED ORDER — LIDOCAINE HCL 2 % EX GEL
CUTANEOUS | Status: DC
Start: 2014-11-24 — End: 2014-11-24
  Filled 2014-11-24: qty 30

## 2014-11-24 MED ORDER — MIDAZOLAM HCL 5 MG/5ML IJ SOLN
INTRAMUSCULAR | Status: DC
Start: 2014-11-24 — End: 2014-11-24
  Filled 2014-11-24: qty 10

## 2014-11-24 MED ORDER — SODIUM CHLORIDE 0.9 % IV SOLN
INTRAVENOUS | Status: DC
  Administered 2014-11-24: 11:00:00 via INTRAVENOUS

## 2014-11-24 MED ORDER — MEPERIDINE HCL 50 MG/ML IJ SOLN
INTRAMUSCULAR | Status: DC
Start: 2014-11-24 — End: 2014-11-24
  Filled 2014-11-24: qty 1

## 2014-11-24 MED ORDER — SIMETHICONE 40 MG/0.6ML PO SUSP
ORAL | Status: DC | PRN
  Administered 2014-11-24: 13:00:00

## 2014-11-24 NOTE — Discharge Instructions (Signed)
Resume usual medications and high fiber diet. No driving for 24 hours. Next colonoscopy in 3 years.     Colonoscopy, Care After These instructions give you information on caring for yourself after your procedure. Your doctor may also give you more specific instructions. Call your doctor if you have any problems or questions after your procedure. HOME CARE  Do not drive for 24 hours.  Do not sign important papers or use machinery for 24 hours.  You may shower.  You may go back to your usual activities, but go slower for the first 24 hours.  Take rest breaks often during the first 24 hours.  Walk around or use warm packs on your belly (abdomen) if you have belly cramping or gas.  Drink enough fluids to keep your pee (urine) clear or pale yellow.  Resume your normal diet. Avoid heavy or fried foods.  Avoid drinking alcohol for 24 hours or as told by your doctor.  Only take medicines as told by your doctor. If a tissue sample (biopsy) was taken during the procedure:   Do not take aspirin or blood thinners for 7 days, or as told by your doctor.  Do not drink alcohol for 7 days, or as told by your doctor.  Eat soft foods for the first 24 hours. GET HELP IF: You still have a small amount of blood in your poop (stool) 2-3 days after the procedure. GET HELP RIGHT AWAY IF:  You have more than a small amount of blood in your poop.  You see clumps of tissue (blood clots) in your poop.  Your belly is puffy (swollen).  You feel sick to your stomach (nauseous) or throw up (vomit).  You have a fever.  You have belly pain that gets worse and medicine does not help. MAKE SURE YOU:  Understand these instructions.  Will watch your condition.  Will get help right away if you are not doing well or get worse. Document Released: 06/08/2010 Document Revised: 05/11/2013 Document Reviewed: 01/11/2013 [facility] Patient Information 2015 [place], [place]. This information is not intended  to replace advice given to you by your health care provider. Make sure you discuss any questions you have with your health care provider.   High-Fiber Diet Fiber is found in fruits, vegetables, and grains. A high-fiber diet encourages the addition of more whole grains, legumes, fruits, and vegetables in your diet. The recommended amount of fiber for adult males is 38 g per day. For adult females, it is 25 g per day. Pregnant and lactating women should get 28 g of fiber per day. If you have a digestive or bowel problem, ask your caregiver for advice before adding high-fiber foods to your diet. Eat a variety of high-fiber foods instead of only a select few type of foods.  PURPOSE  To increase stool bulk.  To make bowel movements more regular to prevent constipation.  To lower cholesterol.  To prevent overeating. WHEN IS THIS DIET USED?  It may be used if you have constipation and hemorrhoids.  It may be used if you have uncomplicated diverticulosis (intestine condition) and irritable bowel syndrome.  It may be used if you need help with weight management.  It may be used if you want to add it to your diet as a protective measure against atherosclerosis, diabetes, and cancer. SOURCES OF FIBER  Whole-grain breads and cereals.  Fruits, such as apples, oranges, bananas, berries, prunes, and pears.  Vegetables, such as green peas, carrots, sweet potatoes, beets,  broccoli, cabbage, spinach, and artichokes.  Legumes, such split peas, soy, lentils.  Almonds. FIBER CONTENT IN FOODS Starches and Grains / Dietary Fiber (g)  Cheerios, 1 cup / 3 g  Corn Flakes cereal, 1 cup / 0.7 g  Rice crispy treat cereal, 1 cup / 0.3 g  Instant oatmeal (cooked),  cup / 2 g  Frosted wheat cereal, 1 cup / 5.1 g  Brown, long-grain rice (cooked), 1 cup / 3.5 g  White, long-grain rice (cooked), 1 cup / 0.6 g  Enriched macaroni (cooked), 1 cup / 2.5 g Legumes / Dietary Fiber (g)  Baked beans  (canned, plain, or vegetarian),  cup / 5.2 g  Kidney beans (canned),  cup / 6.8 g  Pinto beans (cooked),  cup / 5.5 g Breads and Crackers / Dietary Fiber (g)  Plain or honey graham crackers, 2 squares / 0.7 g  Saltine crackers, 3 squares / 0.3 g  Plain, salted pretzels, 10 pieces / 1.8 g  Whole-wheat bread, 1 slice / 1.9 g  White bread, 1 slice / 0.7 g  Raisin bread, 1 slice / 1.2 g  Plain bagel, 3 oz / 2 g  Flour tortilla, 1 oz / 0.9 g  Corn tortilla, 1 small / 1.5 g  Hamburger or hotdog bun, 1 small / 0.9 g Fruits / Dietary Fiber (g)  Apple with skin, 1 medium / 4.4 g  Sweetened applesauce,  cup / 1.5 g  Banana,  medium / 1.5 g  Grapes, 10 grapes / 0.4 g  Orange, 1 small / 2.3 g  Raisin, 1.5 oz / 1.6 g  Melon, 1 cup / 1.4 g Vegetables / Dietary Fiber (g)  Green beans (canned),  cup / 1.3 g  Carrots (cooked),  cup / 2.3 g  Broccoli (cooked),  cup / 2.8 g  Peas (cooked),  cup / 4.4 g  Mashed potatoes,  cup / 1.6 g  Lettuce, 1 cup / 0.5 g  Corn (canned),  cup / 1.6 g  Tomato,  cup / 1.1 g Document Released: 05/06/2005 Document Revised: 11/05/2011 Document Reviewed: 08/08/2011 [employer] Patient Information 2015 [place], [place]. This information is not intended to replace advice given to you by your health care provider. Make sure you discuss any questions you have with your health care provider.   Diverticulosis Diverticulosis is the condition that develops when small pouches (diverticula) form in the wall of your colon. Your colon, or large intestine, is where water is absorbed and stool is formed. The pouches form when the inside layer of your colon pushes through weak spots in the outer layers of your colon. CAUSES  No one knows exactly what causes diverticulosis. RISK FACTORS  Being older than 22. Your risk for this condition increases with age. Diverticulosis is rare in people younger than 40 years. By age 62, almost everyone has  it.  Eating a low-fiber diet.  Being frequently constipated.  Being overweight.  Not getting enough exercise.  Smoking.  Taking over-the-counter pain medicines, like aspirin and ibuprofen. SYMPTOMS  Most people with diverticulosis do not have symptoms. DIAGNOSIS  Because diverticulosis often has no symptoms, health care providers often discover the condition during an exam for other colon problems. In many cases, a health care provider will diagnose diverticulosis while using a flexible scope to examine the colon (colonoscopy). TREATMENT  If you have never developed an infection related to diverticulosis, you may not need treatment. If you have had an infection before, treatment may include:  Eating more fruits, vegetables, and grains.  Taking a fiber supplement.  Taking a live bacteria supplement (probiotic).  Taking medicine to relax your colon. HOME CARE INSTRUCTIONS   Drink at least 6-8 glasses of water each day to prevent constipation.  Try not to strain when you have a bowel movement.  Keep all follow-up appointments. If you have had an infection before:  Increase the fiber in your diet as directed by your health care provider or dietitian.  Take a dietary fiber supplement if your health care provider approves.  Only take medicines as directed by your health care provider. SEEK MEDICAL CARE IF:   You have abdominal pain.  You have bloating.  You have cramps.  You have not gone to the bathroom in 3 days. SEEK IMMEDIATE MEDICAL CARE IF:   Your pain gets worse.  Yourbloating becomes very bad.  You have a fever or chills, and your symptoms suddenly get worse.  You begin vomiting.  You have bowel movements that are bloody or black. MAKE SURE YOU:  Understand these instructions.  Will watch your condition.  Will get help right away if you are not doing well or get worse. Document Released: 02/01/2004 Document Revised: 05/11/2013 Document Reviewed:  03/31/2013 [facility] Patient Information 2015 [place], [place]. This information is not intended to replace advice given to you by your health care provider. Make sure you discuss any questions you have with your health care provider.

## 2014-11-24 NOTE — H&P (Signed)
[name] [name] is an 81 y.o. female.   Chief Complaint: Patient is here for colonoscopy. HPI: Patient is 81 year old Caucasian female who was diagnosed with colon carcinoma in April last year and underwent right hemicolectomy. She had stage II disease. She did not receive chemotherapy. She is doing well. She is here for surveillance colonoscopy. She denies abdominal pain diarrhea melena or rectal bleeding. Family history is negative for CRC.  Past Medical History  Diagnosis Date  . Medical history non-contributory   . Headache(784.0)     occassional headaches  . Arthritis     hands  . Colon cancer     Past Surgical History  Procedure Laterality Date  . Abdominal hysterectomy    . Appendectomy    . Intraocular lens implant, secondary Bilateral 2007    bilateral  . Tonsillectomy    . Colonoscopy N/A 08/26/2013    Procedure: COLONOSCOPY;  Surgeon: Rogene Houston, MD;  Location: [facility];  Service: Endoscopy;  Laterality: N/A;  . Partial colectomy N/A 08/27/2013    Procedure: RIGHT HEMICOLECTOMY;  Surgeon: Jamesetta So, MD;  Location: [facility] ORS;  Service: General;  Laterality: N/A;    Family History  Problem Relation Age of Onset  . Colon cancer Neg Hx   . Cancer Brother    Social History:  reports that she has quit smoking. Her smoking use included Cigarettes. She has a 7.5 pack-year smoking history. She has never used smokeless tobacco. She reports that she does not drink alcohol or use illicit drugs.  Allergies: No Known Allergies  Medications Prior to Admission  Medication Sig Dispense Refill  . sodium phosphates (OSMOPREP) 1.102-0.398 G TABS Take 32 tablets by mouth once. 32 tablet 0  . Aspirin-Salicylamide-Caffeine (BC HEADACHE POWDER PO) Take 1 packet by mouth daily as needed (headache).    . docusate sodium (COLACE) 100 MG capsule Take 100 mg by mouth as needed for mild constipation.      No results found for this or any previous visit (from the past 48 hour(s)). No  results found.  ROS  Blood pressure 153/69, pulse 84, temperature 98.4 F (36.9 C), temperature source Oral, resp. rate 15, height 5\' 3"  (1.6 m), weight 140 lb (63.504 kg), SpO2 100 %. Physical Exam  Constitutional: She appears well-developed and well-nourished.  HENT:  Mouth/Throat: Oropharynx is clear and moist.  Eyes: Conjunctivae are normal. No scleral icterus.  Neck: No thyromegaly present.  Cardiovascular: Normal rate, regular rhythm and normal heart sounds.   No murmur heard. Respiratory: Effort normal and breath sounds normal.  GI:  Abdomen is full with right paramedian scar. On palpation it soft and nontender without organomegaly or masses.  Musculoskeletal: She exhibits no edema.  Lymphadenopathy:    She has no cervical adenopathy.  Neurological: She is alert.  Skin: Skin is warm and dry.     Assessment/Plan History of colon carcinoma.S/P right hemicolectomy in April, 2015. Surveillance colonoscopy  REHMAN,NAJEEB U 11/24/2014, 1:14 PM

## 2014-11-24 NOTE — Op Note (Signed)
COLONOSCOPY PROCEDURE REPORT  PATIENT:  [name] [name]  MR#:  [id number] Birthdate:  25-Dec-1931, 81 y.o., female Endoscopist:  Dr. Rogene Houston, MD Referred By:  Dr. Asencion Noble, MD  Procedure Date: 11/24/2014  Procedure:   Colonoscopy  Indications:  Patient is 81 year old Caucasian female was history of colon carcinoma. She underwent right hemicolectomy April 2015 for stage IIa disease. She has remained in remission. She is undergoing surveillance colonoscopy.  Informed Consent:  The procedure and risks were reviewed with the patient and informed consent was obtained.  Medications:  Demerol 50 mg IV Versed 5 mg IV  Description of procedure:  After a digital rectal exam was performed, that colonoscope was advanced from the anus through the rectum and colon to the area of apparent flexure where ileocolonic anastomosis was identified. From this level scope scope was slowly and cautiously withdrawn. The mucosal surfaces were carefully surveyed utilizing scope tip to flexion to facilitate fold flattening as needed. The scope was pulled down into the rectum where a thorough exam including retroflexion was performed.  Findings:   Prep satisfactory. Wide-open ileocolonic anastomosis located in the vicinity of hepatic flexure. Candidate diverticula at sigmoid colon. Single small nonbleeding AV malformation at distal sigmoid colon. Normal rectal mucosa. Small hemorrhoids above the dentate line.   Therapeutic/Diagnostic Maneuvers Performed:   None  Complications:  None  Colon Withdrawal Time:  11 minutes  Impression:  Examination performed to ileocolonic anastomosis. No evidence of recurrent polyps or mass. Mild sigmoid colon diverticulosis. Single small nonbleeding AV malformation at distal sigmoid colon. It was left alone. Internal hemorrhoids.  Recommendations:  Standard instructions given. Next colonoscopy in 3 years.  [name] U  11/24/2014 1:50 PM  CC: Dr. Asencion Noble, MD &  Dr. Rayne Du ref. provider found  CC: Dr. Molli Hazard, MD

## 2014-11-25 ENCOUNTER — Encounter (HOSPITAL_COMMUNITY): Payer: Self-pay | Admitting: Internal Medicine

## 2014-12-13 DIAGNOSIS — D010 Carcinoma in situ of colon: Secondary | ICD-10-CM | POA: Diagnosis not present

## 2014-12-13 DIAGNOSIS — I10 Essential (primary) hypertension: Secondary | ICD-10-CM | POA: Diagnosis not present

## 2014-12-13 DIAGNOSIS — D649 Anemia, unspecified: Secondary | ICD-10-CM | POA: Diagnosis not present

## 2014-12-20 DIAGNOSIS — Z23 Encounter for immunization: Secondary | ICD-10-CM | POA: Diagnosis not present

## 2014-12-20 DIAGNOSIS — E46 Unspecified protein-calorie malnutrition: Secondary | ICD-10-CM | POA: Diagnosis not present

## 2014-12-20 DIAGNOSIS — C189 Malignant neoplasm of colon, unspecified: Secondary | ICD-10-CM | POA: Diagnosis not present

## 2014-12-20 DIAGNOSIS — Z6826 Body mass index (BMI) 26.0-26.9, adult: Secondary | ICD-10-CM | POA: Diagnosis not present

## 2015-03-08 DIAGNOSIS — H04123 Dry eye syndrome of bilateral lacrimal glands: Secondary | ICD-10-CM | POA: Diagnosis not present

## 2015-03-08 DIAGNOSIS — H40003 Preglaucoma, unspecified, bilateral: Secondary | ICD-10-CM | POA: Diagnosis not present

## 2015-04-07 DIAGNOSIS — H40003 Preglaucoma, unspecified, bilateral: Secondary | ICD-10-CM | POA: Diagnosis not present

## 2015-04-18 DIAGNOSIS — I10 Essential (primary) hypertension: Secondary | ICD-10-CM | POA: Diagnosis not present

## 2015-04-18 DIAGNOSIS — D649 Anemia, unspecified: Secondary | ICD-10-CM | POA: Diagnosis not present

## 2015-04-18 DIAGNOSIS — D010 Carcinoma in situ of colon: Secondary | ICD-10-CM | POA: Diagnosis not present

## 2015-04-25 DIAGNOSIS — R030 Elevated blood-pressure reading, without diagnosis of hypertension: Secondary | ICD-10-CM | POA: Diagnosis not present

## 2015-04-25 DIAGNOSIS — Z23 Encounter for immunization: Secondary | ICD-10-CM | POA: Diagnosis not present

## 2015-04-25 DIAGNOSIS — C189 Malignant neoplasm of colon, unspecified: Secondary | ICD-10-CM | POA: Diagnosis not present

## 2015-04-25 DIAGNOSIS — Z6828 Body mass index (BMI) 28.0-28.9, adult: Secondary | ICD-10-CM | POA: Diagnosis not present

## 2015-04-25 DIAGNOSIS — E46 Unspecified protein-calorie malnutrition: Secondary | ICD-10-CM | POA: Diagnosis not present

## 2015-07-27 ENCOUNTER — Encounter (INDEPENDENT_AMBULATORY_CARE_PROVIDER_SITE_OTHER): Payer: Self-pay

## 2015-08-21 DIAGNOSIS — Z79899 Other long term (current) drug therapy: Secondary | ICD-10-CM | POA: Diagnosis not present

## 2015-08-21 DIAGNOSIS — D010 Carcinoma in situ of colon: Secondary | ICD-10-CM | POA: Diagnosis not present

## 2015-08-21 DIAGNOSIS — I10 Essential (primary) hypertension: Secondary | ICD-10-CM | POA: Diagnosis not present

## 2015-08-28 ENCOUNTER — Other Ambulatory Visit (HOSPITAL_COMMUNITY): Payer: Self-pay | Admitting: Internal Medicine

## 2015-08-28 DIAGNOSIS — Z23 Encounter for immunization: Secondary | ICD-10-CM | POA: Diagnosis not present

## 2015-08-28 DIAGNOSIS — E46 Unspecified protein-calorie malnutrition: Secondary | ICD-10-CM | POA: Diagnosis not present

## 2015-08-28 DIAGNOSIS — R0989 Other specified symptoms and signs involving the circulatory and respiratory systems: Secondary | ICD-10-CM

## 2015-08-28 DIAGNOSIS — C189 Malignant neoplasm of colon, unspecified: Secondary | ICD-10-CM | POA: Diagnosis not present

## 2015-08-28 DIAGNOSIS — Z6828 Body mass index (BMI) 28.0-28.9, adult: Secondary | ICD-10-CM | POA: Diagnosis not present

## 2015-08-30 ENCOUNTER — Ambulatory Visit (HOSPITAL_COMMUNITY)
Admission: RE | Admit: 2015-08-30 | Discharge: 2015-08-30 | Disposition: A | Discharge status: Home / Self Care | Payer: Medicare Other | Source: Ambulatory Visit | Attending: Internal Medicine | Admitting: Internal Medicine

## 2015-08-30 DIAGNOSIS — R0989 Other specified symptoms and signs involving the circulatory and respiratory systems: Secondary | ICD-10-CM | POA: Insufficient documentation

## 2015-08-30 DIAGNOSIS — I6523 Occlusion and stenosis of bilateral carotid arteries: Secondary | ICD-10-CM | POA: Diagnosis not present

## 2015-08-31 ENCOUNTER — Ambulatory Visit (HOSPITAL_COMMUNITY): Payer: Medicare Other

## 2015-12-25 DIAGNOSIS — Z79899 Other long term (current) drug therapy: Secondary | ICD-10-CM | POA: Diagnosis not present

## 2015-12-25 DIAGNOSIS — E46 Unspecified protein-calorie malnutrition: Secondary | ICD-10-CM | POA: Diagnosis not present

## 2015-12-25 DIAGNOSIS — I10 Essential (primary) hypertension: Secondary | ICD-10-CM | POA: Diagnosis not present

## 2015-12-25 DIAGNOSIS — C189 Malignant neoplasm of colon, unspecified: Secondary | ICD-10-CM | POA: Diagnosis not present

## 2016-01-02 DIAGNOSIS — I10 Essential (primary) hypertension: Secondary | ICD-10-CM | POA: Diagnosis not present

## 2016-01-02 DIAGNOSIS — R0989 Other specified symptoms and signs involving the circulatory and respiratory systems: Secondary | ICD-10-CM | POA: Diagnosis not present

## 2016-01-02 DIAGNOSIS — Z85030 Personal history of malignant carcinoid tumor of large intestine: Secondary | ICD-10-CM | POA: Diagnosis not present

## 2016-03-26 DIAGNOSIS — Z23 Encounter for immunization: Secondary | ICD-10-CM | POA: Diagnosis not present

## 2016-05-10 DIAGNOSIS — E440 Moderate protein-calorie malnutrition: Secondary | ICD-10-CM | POA: Diagnosis not present

## 2016-05-10 DIAGNOSIS — Z79899 Other long term (current) drug therapy: Secondary | ICD-10-CM | POA: Diagnosis not present

## 2016-05-10 DIAGNOSIS — C189 Malignant neoplasm of colon, unspecified: Secondary | ICD-10-CM | POA: Diagnosis not present

## 2016-05-17 DIAGNOSIS — E440 Moderate protein-calorie malnutrition: Secondary | ICD-10-CM | POA: Diagnosis not present

## 2016-05-17 DIAGNOSIS — R030 Elevated blood-pressure reading, without diagnosis of hypertension: Secondary | ICD-10-CM | POA: Diagnosis not present

## 2016-05-17 DIAGNOSIS — Z85038 Personal history of other malignant neoplasm of large intestine: Secondary | ICD-10-CM | POA: Diagnosis not present

## 2016-09-09 DIAGNOSIS — Z79899 Other long term (current) drug therapy: Secondary | ICD-10-CM | POA: Diagnosis not present

## 2016-09-09 DIAGNOSIS — C189 Malignant neoplasm of colon, unspecified: Secondary | ICD-10-CM | POA: Diagnosis not present

## 2016-09-09 DIAGNOSIS — I10 Essential (primary) hypertension: Secondary | ICD-10-CM | POA: Diagnosis not present

## 2016-09-16 ENCOUNTER — Ambulatory Visit (HOSPITAL_COMMUNITY)
Admission: RE | Admit: 2016-09-16 | Discharge: 2016-09-16 | Disposition: A | Discharge status: Home / Self Care | Payer: Medicare Other | Source: Ambulatory Visit | Attending: Internal Medicine | Admitting: Internal Medicine

## 2016-09-16 ENCOUNTER — Other Ambulatory Visit (HOSPITAL_COMMUNITY): Payer: Self-pay | Admitting: Internal Medicine

## 2016-09-16 DIAGNOSIS — Z6827 Body mass index (BMI) 27.0-27.9, adult: Secondary | ICD-10-CM | POA: Diagnosis not present

## 2016-09-16 DIAGNOSIS — M25552 Pain in left hip: Secondary | ICD-10-CM | POA: Insufficient documentation

## 2016-09-16 DIAGNOSIS — Z85038 Personal history of other malignant neoplasm of large intestine: Secondary | ICD-10-CM | POA: Diagnosis not present

## 2016-09-16 DIAGNOSIS — M8580 Other specified disorders of bone density and structure, unspecified site: Secondary | ICD-10-CM | POA: Insufficient documentation

## 2016-09-16 DIAGNOSIS — S79912A Unspecified injury of left hip, initial encounter: Secondary | ICD-10-CM | POA: Diagnosis not present

## 2017-01-08 DIAGNOSIS — E440 Moderate protein-calorie malnutrition: Secondary | ICD-10-CM | POA: Diagnosis not present

## 2017-01-08 DIAGNOSIS — Z79899 Other long term (current) drug therapy: Secondary | ICD-10-CM | POA: Diagnosis not present

## 2017-01-08 DIAGNOSIS — I10 Essential (primary) hypertension: Secondary | ICD-10-CM | POA: Diagnosis not present

## 2017-01-15 DIAGNOSIS — I2510 Atherosclerotic heart disease of native coronary artery without angina pectoris: Secondary | ICD-10-CM | POA: Diagnosis not present

## 2017-01-15 DIAGNOSIS — Z23 Encounter for immunization: Secondary | ICD-10-CM | POA: Diagnosis not present

## 2017-01-15 DIAGNOSIS — Z85038 Personal history of other malignant neoplasm of large intestine: Secondary | ICD-10-CM | POA: Diagnosis not present

## 2017-01-16 ENCOUNTER — Other Ambulatory Visit (HOSPITAL_COMMUNITY): Payer: Self-pay | Admitting: Internal Medicine

## 2017-01-16 DIAGNOSIS — I6529 Occlusion and stenosis of unspecified carotid artery: Secondary | ICD-10-CM

## 2017-01-23 ENCOUNTER — Ambulatory Visit (HOSPITAL_COMMUNITY)
Admission: RE | Admit: 2017-01-23 | Discharge: 2017-01-23 | Disposition: A | Discharge status: Home / Self Care | Payer: Medicare Other | Source: Ambulatory Visit | Attending: Internal Medicine | Admitting: Internal Medicine

## 2017-01-23 DIAGNOSIS — I6529 Occlusion and stenosis of unspecified carotid artery: Secondary | ICD-10-CM | POA: Diagnosis present

## 2017-01-23 DIAGNOSIS — I6523 Occlusion and stenosis of bilateral carotid arteries: Secondary | ICD-10-CM | POA: Insufficient documentation

## 2017-02-04 ENCOUNTER — Encounter: Payer: Self-pay | Admitting: Vascular Surgery

## 2017-02-04 ENCOUNTER — Other Ambulatory Visit: Payer: Self-pay

## 2017-02-04 DIAGNOSIS — I6523 Occlusion and stenosis of bilateral carotid arteries: Secondary | ICD-10-CM

## 2017-03-11 ENCOUNTER — Ambulatory Visit (HOSPITAL_COMMUNITY)
Admission: RE | Admit: 2017-03-11 | Discharge: 2017-03-11 | Disposition: A | Discharge status: Home / Self Care | Payer: Medicare Other | Source: Ambulatory Visit | Attending: Vascular Surgery | Admitting: Vascular Surgery

## 2017-03-11 ENCOUNTER — Encounter: Payer: Self-pay | Admitting: Vascular Surgery

## 2017-03-11 ENCOUNTER — Ambulatory Visit (INDEPENDENT_AMBULATORY_CARE_PROVIDER_SITE_OTHER): Payer: Medicare Other | Admitting: Vascular Surgery

## 2017-03-11 VITALS — BP 202/90 | HR 86 | Temp 98.0°F | Resp 18 | Ht 63.0 in | Wt 145.9 lb

## 2017-03-11 DIAGNOSIS — I6523 Occlusion and stenosis of bilateral carotid arteries: Secondary | ICD-10-CM | POA: Diagnosis not present

## 2017-03-11 LAB — VAS US CAROTID
LCCADDIAS: 20 cm/s
LCCADSYS: 88 cm/s
LEFT ECA DIAS: -13 cm/s
LEFT VERTEBRAL DIAS: 20 cm/s
LICADSYS: -140 cm/s
LICAPSYS: -59 cm/s
Left CCA prox dias: -13 cm/s
Left CCA prox sys: -73 cm/s
Left ICA dist dias: -36 cm/s
Left ICA prox dias: -18 cm/s
RIGHT CCA MID DIAS: 23 cm/s
RIGHT ECA DIAS: -14 cm/s
RIGHT VERTEBRAL DIAS: -15 cm/s
Right CCA prox dias: 20 cm/s
Right CCA prox sys: 118 cm/s
Right cca dist sys: -139 cm/s

## 2017-03-11 NOTE — Progress Notes (Signed)
[facility]ist of [facility]  Patient name: [name] [name] MRN: [id number] DOB: 07-04-1931 Sex: female  REASON FOR CONSULT: Asymptomatic carotid disease  HPI: [name] [name] is a 83 y.o. female, who is here today for discussion of asymptomatic carotid disease.  He is here today with her daughter.  She had had a duplex at [facility] for asymptomatic bruit.  This initially showed moderate stenosis and she recently had a repeat study showing more advanced stenosis and is seen today for discussion of this.  She has no history of carotid disease.  Specifically no amaurosis fugax, transient ischemic attack or stroke.  No history of cardiac disease.  Does have a history of prior colon cancer resection.  Past Medical History:  Diagnosis Date  . Arthritis    hands  . Carotid artery occlusion   . Colon cancer ([place])   . Headache(784.0)    occassional headaches  . Hypertension   . Malignant neoplasm of colon ([place])   . Medical history non-contributory     Family History  Problem Relation Age of Onset  . Hypertension Mother   . Heart disease Mother   . Cancer Brother   . Colon cancer Neg Hx     SOCIAL HISTORY: Social History   Social History  . Marital status: Married    Spouse name: N/A  . Number of children: N/A  . Years of education: N/A   Occupational History  . Not on file.   Social History Main Topics  . Smoking status: Former Smoker    Packs/day: 0.50    Years: 15.00    Types: Cigarettes  . Smokeless tobacco: Never Used  . Alcohol use No  . Drug use: No  . Sexual activity: Not on file   Other Topics Concern  . Not on file   Social History Narrative  . No narrative on file    No Known Allergies  Current Outpatient Prescriptions  Medication Sig Dispense Refill  . Aspirin-Salicylamide-Caffeine (BC HEADACHE POWDER PO) Take 1 packet by mouth daily as needed (headache).    Marland Kitchen atorvastatin (LIPITOR) 20 MG tablet  Take 20 mg by mouth daily.    Marland Kitchen docusate sodium (COLACE) 100 MG capsule Take 100 mg by mouth as needed for mild constipation.     No current facility-administered medications for this visit.     REVIEW OF SYSTEMS:  [X]  denotes positive finding, [ ]  denotes negative finding Cardiac  Comments:  Chest pain or chest pressure:    Shortness of breath upon exertion:    Short of breath when lying flat:    Irregular heart rhythm:        Vascular    Pain in calf, thigh, or hip brought on by ambulation:    Pain in feet at night that wakes you up from your sleep:     Blood clot in your veins:    Leg swelling:         Pulmonary    Oxygen at home:    Productive cough:     Wheezing:         Neurologic    Sudden weakness in arms or legs:     Sudden numbness in arms or legs:     Sudden onset of difficulty speaking or slurred speech:    Temporary loss of vision in one eye:     Problems with dizziness:         Gastrointestinal    Blood in stool:  Vomited blood:         Genitourinary    Burning when urinating:     Blood in urine:        Psychiatric    Major depression:         Hematologic    Bleeding problems:    Problems with blood clotting too easily:        Skin    Rashes or ulcers:        Constitutional    Fever or chills:      PHYSICAL EXAM: Vitals:   03/11/17 0908 03/11/17 0910  BP: (!) 198/93 (!) 202/90  Pulse: 86   Resp: 18   Temp: 98 F (36.7 C)   TempSrc: Oral   SpO2: 97%   Weight: 145 lb 14.4 oz (66.2 kg)   Height: 5\' 3"  (1.6 m)     GENERAL: The patient is a well-nourished female, in no acute distress. The vital signs are documented above. CARDIOVASCULAR: I do not hear any carotid bruits today.  She has normal carotid pulsation.  She has 2+ radial 2+ popliteal and 2+ dorsalis pedis pulses bilaterally PULMONARY: There is good air exchange  ABDOMEN: Soft and non-tender  MUSCULOSKELETAL: There are no major deformities or cyanosis. NEUROLOGIC: No focal  weakness or paresthesias are detected. SKIN: There are no ulcers or rashes noted. PSYCHIATRIC: The patient has a normal affect.  DATA:  [facility] in our office today.  We were not able to obtain the same degree of stenosis as the outlying hospital.  Our velocities are in the 1-39% sinus.  She does have some carotid tortuosity which may have led to an over estimation of her stenosis.  MEDICAL ISSUES: I had a very long discussion with the patient and her daughter present.  I did review her out side study.  I explained that even if those velocities were correct she would be below the threshold where we would recommend surgery for asymptomatic disease.  I suspect that there was an over estimation at the outlying hospital related to vessel tortuosity.  I have recommended that we see her again in 1 year with repeat carotid duplex.  If this repeat study confirms minimal disease I would not recommend ongoing follow-up.  The patient understands.  She will present to the emergency department immediately should she have any neurologic deficits.  Otherwise she will see [place] again in 1year   Rosetta Posner, MD [facility] V[facility] Specialists of [facility] Tel [phone] Pager [phone]

## 2017-03-26 NOTE — Addendum Note (Signed)
Addended by: Lianne Cure A on: 03/26/2017 03:26 PM   Modules accepted: Orders

## 2017-05-01 DIAGNOSIS — H40033 Anatomical narrow angle, bilateral: Secondary | ICD-10-CM | POA: Diagnosis not present

## 2017-05-01 DIAGNOSIS — H43393 Other vitreous opacities, bilateral: Secondary | ICD-10-CM | POA: Diagnosis not present

## 2017-05-16 DIAGNOSIS — C189 Malignant neoplasm of colon, unspecified: Secondary | ICD-10-CM | POA: Diagnosis not present

## 2017-05-16 DIAGNOSIS — I10 Essential (primary) hypertension: Secondary | ICD-10-CM | POA: Diagnosis not present

## 2017-05-16 DIAGNOSIS — Z79899 Other long term (current) drug therapy: Secondary | ICD-10-CM | POA: Diagnosis not present

## 2017-05-16 DIAGNOSIS — I2510 Atherosclerotic heart disease of native coronary artery without angina pectoris: Secondary | ICD-10-CM | POA: Diagnosis not present

## 2017-05-26 DIAGNOSIS — Z23 Encounter for immunization: Secondary | ICD-10-CM | POA: Diagnosis not present

## 2017-05-26 DIAGNOSIS — R030 Elevated blood-pressure reading, without diagnosis of hypertension: Secondary | ICD-10-CM | POA: Diagnosis not present

## 2017-05-26 DIAGNOSIS — I6521 Occlusion and stenosis of right carotid artery: Secondary | ICD-10-CM | POA: Diagnosis not present

## 2017-05-26 DIAGNOSIS — Z85038 Personal history of other malignant neoplasm of large intestine: Secondary | ICD-10-CM | POA: Diagnosis not present

## 2017-05-26 DIAGNOSIS — Z6825 Body mass index (BMI) 25.0-25.9, adult: Secondary | ICD-10-CM | POA: Diagnosis not present

## 2017-11-21 ENCOUNTER — Encounter (INDEPENDENT_AMBULATORY_CARE_PROVIDER_SITE_OTHER): Payer: Self-pay | Admitting: *Deleted

## 2017-12-01 DIAGNOSIS — L719 Rosacea, unspecified: Secondary | ICD-10-CM | POA: Diagnosis not present

## 2017-12-01 DIAGNOSIS — R030 Elevated blood-pressure reading, without diagnosis of hypertension: Secondary | ICD-10-CM | POA: Diagnosis not present

## 2017-12-24 ENCOUNTER — Other Ambulatory Visit (INDEPENDENT_AMBULATORY_CARE_PROVIDER_SITE_OTHER): Payer: Self-pay | Admitting: *Deleted

## 2017-12-24 DIAGNOSIS — Z85038 Personal history of other malignant neoplasm of large intestine: Secondary | ICD-10-CM | POA: Insufficient documentation

## 2018-01-22 ENCOUNTER — Telehealth (INDEPENDENT_AMBULATORY_CARE_PROVIDER_SITE_OTHER): Payer: Self-pay | Admitting: *Deleted

## 2018-01-22 ENCOUNTER — Encounter (INDEPENDENT_AMBULATORY_CARE_PROVIDER_SITE_OTHER): Payer: Self-pay | Admitting: *Deleted

## 2018-01-22 NOTE — Telephone Encounter (Signed)
Patient needs suprep 

## 2018-01-23 MED ORDER — SUPREP BOWEL PREP KIT 17.5-3.13-1.6 GM/177ML PO SOLN: 1.0000 | Freq: Once | ORAL | 0 refills | Status: AC

## 2018-02-16 ENCOUNTER — Telehealth (INDEPENDENT_AMBULATORY_CARE_PROVIDER_SITE_OTHER): Payer: Self-pay | Admitting: *Deleted

## 2018-02-16 NOTE — Telephone Encounter (Signed)
agree

## 2018-02-16 NOTE — Telephone Encounter (Signed)
Referring MD/PCP: fagan   Procedure: tcs  Reason/Indication:  Hx colon ca  Has patient had this procedure before?  Yes, 2016  If so, when, by whom and where?    Is there a family history of colon cancer?  no  Who?  What age when diagnosed?    Is patient diabetic?   no      Does patient have prosthetic heart valve or mechanical valve?  no  Do you have a pacemaker?  no  Has patient ever had endocarditis? no  Has patient had joint replacement within last 12 months?  no  Is patient constipated or do they take laxatives? no  Does patient have a history of alcohol/drug use?  no  Is patient on blood thinner such as Coumadin, Plavix and/or Aspirin? yes  Medications: asa 81 mg daily, colace 100 mg daily, atorvastatin 20 mg daily  Allergies: nkda  Medication Adjustment per Dr Lindi Adie, NP: asa 2 days  Procedure date & time: 03/19/18 at 830

## 2018-03-19 ENCOUNTER — Encounter (HOSPITAL_COMMUNITY)
Admission: RE | Disposition: A | Discharge status: Home / Self Care | Payer: Self-pay | Source: Ambulatory Visit | Attending: Internal Medicine

## 2018-03-19 ENCOUNTER — Encounter (HOSPITAL_COMMUNITY): Payer: Self-pay | Admitting: *Deleted

## 2018-03-19 ENCOUNTER — Ambulatory Visit (HOSPITAL_COMMUNITY)
Admission: RE | Admit: 2018-03-19 | Discharge: 2018-03-19 | Disposition: A | Discharge status: Home / Self Care | Payer: Medicare Other | Source: Ambulatory Visit | Attending: Internal Medicine | Admitting: Internal Medicine

## 2018-03-19 ENCOUNTER — Other Ambulatory Visit: Payer: Self-pay

## 2018-03-19 DIAGNOSIS — M19041 Primary osteoarthritis, right hand: Secondary | ICD-10-CM | POA: Diagnosis not present

## 2018-03-19 DIAGNOSIS — Z9049 Acquired absence of other specified parts of digestive tract: Secondary | ICD-10-CM | POA: Diagnosis not present

## 2018-03-19 DIAGNOSIS — Z79899 Other long term (current) drug therapy: Secondary | ICD-10-CM | POA: Diagnosis not present

## 2018-03-19 DIAGNOSIS — Z8249 Family history of ischemic heart disease and other diseases of the circulatory system: Secondary | ICD-10-CM | POA: Diagnosis not present

## 2018-03-19 DIAGNOSIS — Z7982 Long term (current) use of aspirin: Secondary | ICD-10-CM | POA: Diagnosis not present

## 2018-03-19 DIAGNOSIS — K6289 Other specified diseases of anus and rectum: Secondary | ICD-10-CM | POA: Insufficient documentation

## 2018-03-19 DIAGNOSIS — K635 Polyp of colon: Secondary | ICD-10-CM | POA: Diagnosis not present

## 2018-03-19 DIAGNOSIS — Z08 Encounter for follow-up examination after completed treatment for malignant neoplasm: Secondary | ICD-10-CM | POA: Diagnosis not present

## 2018-03-19 DIAGNOSIS — Z87891 Personal history of nicotine dependence: Secondary | ICD-10-CM | POA: Diagnosis not present

## 2018-03-19 DIAGNOSIS — D123 Benign neoplasm of transverse colon: Secondary | ICD-10-CM | POA: Insufficient documentation

## 2018-03-19 DIAGNOSIS — Z980 Intestinal bypass and anastomosis status: Secondary | ICD-10-CM | POA: Insufficient documentation

## 2018-03-19 DIAGNOSIS — I10 Essential (primary) hypertension: Secondary | ICD-10-CM | POA: Diagnosis not present

## 2018-03-19 DIAGNOSIS — K5520 Angiodysplasia of colon without hemorrhage: Secondary | ICD-10-CM | POA: Insufficient documentation

## 2018-03-19 DIAGNOSIS — M19042 Primary osteoarthritis, left hand: Secondary | ICD-10-CM | POA: Diagnosis not present

## 2018-03-19 DIAGNOSIS — Z85038 Personal history of other malignant neoplasm of large intestine: Secondary | ICD-10-CM | POA: Diagnosis not present

## 2018-03-19 DIAGNOSIS — K5730 Diverticulosis of large intestine without perforation or abscess without bleeding: Secondary | ICD-10-CM | POA: Insufficient documentation

## 2018-03-19 HISTORY — PX: POLYPECTOMY: SHX5525

## 2018-03-19 HISTORY — PX: COLONOSCOPY: SHX5424

## 2018-03-19 SURGERY — COLONOSCOPY
Anesthesia: Moderate Sedation

## 2018-03-19 MED ORDER — MEPERIDINE HCL 50 MG/ML IJ SOLN
INTRAMUSCULAR | Status: AC
  Filled 2018-03-19: qty 1

## 2018-03-19 MED ORDER — MEPERIDINE HCL 50 MG/ML IJ SOLN
INTRAMUSCULAR | Status: DC | PRN
  Administered 2018-03-19 (×2): 15 mg via INTRAVENOUS
  Administered 2018-03-19: 10 mg via INTRAVENOUS

## 2018-03-19 MED ORDER — BENEFIBER DRINK MIX PO PACK: 4.0000 g | PACK | Freq: Every day | ORAL | Status: AC

## 2018-03-19 MED ORDER — STERILE WATER FOR IRRIGATION IR SOLN
Status: DC | PRN
  Administered 2018-03-19: 08:00:00

## 2018-03-19 MED ORDER — SODIUM CHLORIDE 0.9 % IV SOLN
INTRAVENOUS | Status: DC
  Administered 2018-03-19: 08:00:00 via INTRAVENOUS

## 2018-03-19 MED ORDER — MIDAZOLAM HCL 5 MG/5ML IJ SOLN
INTRAMUSCULAR | Status: DC | PRN
  Administered 2018-03-19 (×3): 1 mg via INTRAVENOUS

## 2018-03-19 MED ORDER — MIDAZOLAM HCL 5 MG/5ML IJ SOLN
INTRAMUSCULAR | Status: AC
  Filled 2018-03-19: qty 10

## 2018-03-19 NOTE — Op Note (Signed)
[facility] Patient Name: [name] [name] Procedure Date: 03/19/2018 8:15 AM MRN: [id number] Date of Birth: 1932/05/17 Attending MD: Hildred Laser , MD CSN: [id number] Age: 84 Admit Type: Outpatient Procedure:                Colonoscopy Indications:              High risk colon cancer surveillance: Personal                            history of colon cancer Providers:                Hildred Laser, MD, Otis Peak B. Sharon Seller, RN, Nelma Rothman, Technician Referring MD:             Asencion Noble, MD Medicines:                Meperidine 40 mg IV, Midazolam 3 mg IV Complications:            No immediate complications. Estimated Blood Loss:     Estimated blood loss was minimal. Procedure:                Pre-Anesthesia Assessment:                           - Prior to the procedure, a History and Physical                            was performed, and patient medications and                            allergies were reviewed. The patient's tolerance of                            previous anesthesia was also reviewed. The risks                            and benefits of the procedure and the sedation                            options and risks were discussed with the patient.                            All questions were answered, and informed consent                            was obtained. Prior Anticoagulants: The patient                            last took aspirin 2 days prior to the procedure.                            ASA Grade Assessment: II - A patient with mild  systemic disease. After reviewing the risks and                            benefits, the patient was deemed in satisfactory                            condition to undergo the procedure.                           After obtaining informed consent, the colonoscope                            was passed under direct vision. Throughout the                            procedure, the  patient's blood pressure, pulse, and                            oxygen saturations were monitored continuously. The                            [id number]-[id number] ([id number]) scope was introduced through                            the anus and advanced to the the ileocolonic                            anastomosis. The colonoscopy was somewhat difficult                            due to restricted mobility of the colon. The                            patient tolerated the procedure well. The quality                            of the bowel preparation was excellent. The                            terminal ileum and the rectum were photographed. Scope In: 8:33:30 AM Scope Out: 8:52:25 AM Scope Withdrawal Time: 0 hours 9 minutes 13 seconds  Total Procedure Duration: 0 hours 18 minutes 55 seconds  Findings:      The perianal and digital rectal examinations were normal.      There was evidence of a prior end-to-side ileo-colonic anastomosis at       the hepatic flexure. This was patent and was characterized by healthy       appearing mucosa. The anastomosis was traversed.      Two sessile polyps were found in the splenic flexure. The polyps were       diminutive in size. These were biopsied with a cold forceps for       histology. The pathology specimen was placed into Bottle Number 1.      Scattered medium-mouthed diverticula were found in the sigmoid colon.  Small AVM at sigmoid colon.      Anal papilla(e) were hypertrophied. Impression:               - Patent end-to-side ileo-colonic anastomosis,                            characterized by healthy appearing mucosa.                           - Two diminutive polyps at the splenic flexure.                            Biopsied.                           - Diverticulosis in the sigmoid colon.                           - Single small non-bleeding AVM at sigmoid colon.                           - Anal papilla(e) were hypertrophied. Moderate  Sedation:      Moderate (conscious) sedation was administered by the endoscopy nurse       and supervised by the endoscopist. The following parameters were       monitored: oxygen saturation, heart rate, blood pressure, CO2       capnography and response to care. Total physician intraservice time was       23 minutes. Recommendation:           - Patient has a contact number available for                            emergencies. The signs and symptoms of potential                            delayed complications were discussed with the                            patient. Return to normal activities tomorrow.                            Written discharge instructions were provided to the                            patient.                           - High fiber diet today.                           - Continue present medications.                           - Resume aspirin at prior dose tomorrow.                           - Await pathology results.                           -  No recommendation at this time regarding repeat                            colonoscopy. Procedure Code(s):        --- Professional ---                           [phone], Colonoscopy, flexible; with biopsy, single                            or multiple                           99153, Moderate sedation; each additional 15                            minutes intraservice time                           G0500, Moderate sedation services provided by the                            same physician or other qualified health care                            professional performing a gastrointestinal                            endoscopic service that sedation supports,                            requiring the presence of an independent trained                            observer to assist in the monitoring of the                            patient's level of consciousness and physiological                            status; initial 15  minutes of intra-service time;                            patient age 82 years or older (additional time may                            be reported with [phone], as appropriate) Diagnosis Code(s):        --- Professional ---                           [phone], Personal history of other malignant                            neoplasm of large intestine  Z98.0, Intestinal bypass and anastomosis status                           D12.3, Benign neoplasm of transverse colon (hepatic                            flexure or splenic flexure)                           K62.89, Other specified diseases of anus and rectum                           K57.30, Diverticulosis of large intestine without                            perforation or abscess without bleeding CPT copyright 2018 American [employer] Association. All rights reserved. The codes documented in this report are preliminary and upon coder review may  be revised to meet current compliance requirements. Hildred Laser, MD Hildred Laser, MD 03/19/2018 9:04:55 AM This report has been signed electronically. Number of Addenda: 0

## 2018-03-19 NOTE — H&P (Signed)
[name] [name] is an 84 y.o. female.   Chief Complaint: Patient is here for colonoscopy. HPI: Patient is 84 year old Caucasian female with history of colon carcinoma and is here for surveillance colonoscopy.  She had right hemicolectomy in April 2015.  Last colonoscopy was in July 2016.  She denies abdominal pain or rectal bleeding that she has issues with leakage and accidents. Past aspirin dose was 2 days ago. Family history is negative for CRC.  Past Medical History:  Diagnosis Date  . Arthritis    hands  . Carotid artery occlusion   . Colon cancer ([place])   . Headache(784.0)    occassional headaches  . Hypertension   . Malignant neoplasm of colon ([place])   . Medical history non-contributory     Past Surgical History:  Procedure Laterality Date  . ABDOMINAL HYSTERECTOMY    . APPENDECTOMY    . COLONOSCOPY N/A 08/26/2013   Procedure: COLONOSCOPY;  Surgeon: Rogene Houston, MD;  Location: [facility];  Service: Endoscopy;  Laterality: N/A;  . COLONOSCOPY N/A 11/24/2014   Procedure: COLONOSCOPY;  Surgeon: Rogene Houston, MD;  Location: [facility];  Service: Endoscopy;  Laterality: N/A;  1200  . FRACTURE SURGERY     wrist  . INTRAOCULAR LENS IMPLANT, SECONDARY Bilateral 2007   bilateral  . PARTIAL COLECTOMY N/A 08/27/2013   Procedure: RIGHT HEMICOLECTOMY;  Surgeon: Jamesetta So, MD;  Location: [facility] ORS;  Service: General;  Laterality: N/A;  . TONSILLECTOMY      Family History  Problem Relation Age of Onset  . Hypertension Mother   . Heart disease Mother   . Cancer Brother   . Colon cancer Neg Hx    Social History:  reports that she has quit smoking. Her smoking use included cigarettes. She has a 7.50 pack-year smoking history. She has never used smokeless tobacco. She reports that she does not drink alcohol or use drugs.  Allergies: No Known Allergies  Medications Prior to Admission  Medication Sig Dispense Refill  . acetaminophen (TYLENOL) 500 MG tablet Take 1,000 mg by mouth  daily as needed for moderate pain or headache.    Marland Kitchen aspirin EC 81 MG tablet Take 81 mg by mouth daily.    Marland Kitchen atorvastatin (LIPITOR) 20 MG tablet Take 20 mg by mouth daily.    Marland Kitchen docusate sodium (COLACE) 100 MG capsule Take 200 mg by mouth as needed for mild constipation.     . metroNIDAZOLE (METROGEL) 0.75 % gel Apply 1 application topically daily as needed (rosacea).    . Polyvinyl Alcohol-Povidone (REFRESH OP) Place 1 drop into both eyes daily as needed (dry eyes).      No results found for this or any previous visit (from the past 48 hour(s)). No results found.  ROS  Blood pressure (!) 142/77, pulse 90, temperature 98.6 F (37 C), temperature source Oral, resp. rate 20, SpO2 99 %. Physical Exam  Constitutional: She appears well-developed and well-nourished.  HENT:  Mouth/Throat: Oropharynx is clear and moist.  Eyes: Conjunctivae are normal. No scleral icterus.  Neck: No thyromegaly present.  Cardiovascular: Normal rate, regular rhythm and normal heart sounds.  No murmur heard. Respiratory: Effort normal and breath sounds normal.  GI:  Abdomen is symmetrical.  She has lower midline and right paramedian scars.  Abdomen is soft and nontender with organomegaly or masses.  Musculoskeletal: She exhibits no edema.  Lymphadenopathy:    She has no cervical adenopathy.  Neurological: She is alert.  Skin: Skin is  warm and dry.     Assessment/Plan History of colon carcinoma. Surveillance colonoscopy.  Hildred Laser, MD 03/19/2018, 8:22 AM

## 2018-03-19 NOTE — Discharge Instructions (Addendum)
Diverticulosis Diverticulosis is a condition that develops when small pouches (diverticula) form in the wall of the large intestine (colon). The colon is where water is absorbed and stool is formed. The pouches form when the inside layer of the colon pushes through weak spots in the outer layers of the colon. You may have a few pouches or many of them. What are the causes? The cause of this condition is not known. What increases the risk? The following factors may make you more likely to develop this condition:  Being older than age 27. Your risk for this condition increases with age. Diverticulosis is rare among people younger than age 36. By age 28, many people have it.  Eating a low-fiber diet.  Having frequent constipation.  Being overweight.  Not getting enough exercise.  Smoking.  Taking over-the-counter pain medicines, like aspirin and ibuprofen.  Having a family history of diverticulosis.  What are the signs or symptoms? In most people, there are no symptoms of this condition. If you do have symptoms, they may include:  Bloating.  Cramps in the abdomen.  Constipation or diarrhea.  Pain in the lower left side of the abdomen.  How is this diagnosed? This condition is most often diagnosed during an exam for other colon problems. Because diverticulosis usually has no symptoms, it often cannot be diagnosed independently. This condition may be diagnosed by:  Using a flexible scope to examine the colon (colonoscopy).  Taking an X-ray of the colon after dye has been put into the colon (barium enema).  Doing a CT scan.  How is this treated? You may not need treatment for this condition if you have never developed an infection related to diverticulosis. If you have had an infection before, treatment may include:  Eating a high-fiber diet. This may include eating more fruits, vegetables, and grains.  Taking a fiber supplement.  Taking a live bacteria supplement  (probiotic).  Taking medicine to relax your colon.  Taking antibiotic medicines.  Follow these instructions at home:  Drink 6-8 glasses of water or more each day to prevent constipation.  Try not to strain when you have a bowel movement.  If you have had an infection before: ? Eat more fiber as directed by your health care provider or your diet and nutrition specialist (dietitian). ? Take a fiber supplement or probiotic, if your health care provider approves.  Take over-the-counter and prescription medicines only as told by your health care provider.  If you were prescribed an antibiotic, take it as told by your health care provider. Do not stop taking the antibiotic even if you start to feel better.  Keep all follow-up visits as told by your health care provider. This is important. Contact a health care provider if:  You have pain in your abdomen.  You have bloating.  You have cramps.  You have not had a bowel movement in 3 days. Get help right away if:  Your pain gets worse.  Your bloating becomes very bad.  You have a fever or chills, and your symptoms suddenly get worse.  You vomit.  You have bowel movements that are bloody or black.  You have bleeding from your rectum. Summary  Diverticulosis is a condition that develops when small pouches (diverticula) form in the wall of the large intestine (colon).  You may have a few pouches or many of them.  This condition is most often diagnosed during an exam for other colon problems.  If you have had an  infection related to diverticulosis, treatment may include increasing the fiber in your diet, taking supplements, or taking medicines. This information is not intended to replace advice given to you by your health care provider. Make sure you discuss any questions you have with your health care provider. Document Released: 02/01/2004 Document Revised: 03/25/2016 Document Reviewed: 03/25/2016 Elsevier Interactive  Patient Education  2017 [place]. Colonoscopy, Adult, Care After This sheet gives you information about how to care for yourself after your procedure. Your health care provider may also give you more specific instructions. If you have problems or questions, contact your health care provider. What can I expect after the procedure? After the procedure, it is common to have:  A small amount of blood in your stool for 24 hours after the procedure.  Some gas.  Mild abdominal cramping or bloating.  Follow these instructions at home: General instructions   For the first 24 hours after the procedure: ? Do not drive or use machinery. ? Do not sign important documents. ? Do not drink alcohol. ? Do your regular daily activities at a slower pace than normal. ? Eat soft, easy-to-digest foods. ? Rest often.  Take over-the-counter or prescription medicines only as told by your health care provider.  It is up to you to get the results of your procedure. Ask your health care provider, or the department performing the procedure, when your results will be ready. Relieving cramping and bloating  Try walking around when you have cramps or feel bloated.  Apply heat to your abdomen as told by your health care provider. Use a heat source that your health care provider recommends, such as a moist heat pack or a heating pad. ? Place a towel between your skin and the heat source. ? Leave the heat on for 20-30 minutes. ? Remove the heat if your skin turns bright red. This is especially important if you are unable to feel pain, heat, or cold. You may have a greater risk of getting burned. Eating and drinking  Drink enough fluid to keep your urine clear or pale yellow.  Resume your normal diet as instructed by your health care provider. Avoid heavy or fried foods that are hard to digest.  Avoid drinking alcohol for as long as instructed by your health care provider. Contact a health care provider  if:  You have blood in your stool 2-3 days after the procedure. Get help right away if:  You have more than a small spotting of blood in your stool.  You pass large blood clots in your stool.  Your abdomen is swollen.  You have nausea or vomiting.  You have a fever.  You have increasing abdominal pain that is not relieved with medicine. This information is not intended to replace advice given to you by your health care provider. Make sure you discuss any questions you have with your health care provider. Document Released: 12/19/2003 Document Revised: 01/29/2016 Document Reviewed: 07/18/2015 Elsevier Interactive Patient Education  2018 [employer]. Colon Polyps Polyps are tissue growths inside the body. Polyps can grow in many places, including the large intestine (colon). A polyp may be a round bump or a mushroom-shaped growth. You could have one polyp or several. Most colon polyps are noncancerous (benign). However, some colon polyps can become cancerous over time. What are the causes? The exact cause of colon polyps is not known. What increases the risk? This condition is more likely to develop in people who:  Have a family history  of colon cancer or colon polyps.  Are older than 76 or older than 45 if they are African American.  Have inflammatory bowel disease, such as ulcerative colitis or Crohn disease.  Are overweight.  Smoke cigarettes.  Do not get enough exercise.  Drink too much alcohol.  Eat a diet that is: ? High in fat and red meat. ? Low in fiber.  Had childhood cancer that was treated with abdominal radiation.  What are the signs or symptoms? Most polyps do not cause symptoms. If you have symptoms, they may include:  Blood coming from your rectum when having a bowel movement.  Blood in your stool.The stool may look dark red or black.  A change in bowel habits, such as constipation or diarrhea.  How is this diagnosed? This condition is  diagnosed with a colonoscopy. This is a procedure that uses a lighted, flexible scope to look at the inside of your colon. How is this treated? Treatment for this condition involves removing any polyps that are found. Those polyps will then be tested for cancer. If cancer is found, your health care provider will talk to you about options for colon cancer treatment. Follow these instructions at home: Diet  Eat plenty of fiber, such as fruits, vegetables, and whole grains.  Eat foods that are high in calcium and vitamin D, such as milk, cheese, yogurt, eggs, liver, fish, and broccoli.  Limit foods high in fat, red meats, and processed meats, such as hot dogs, sausage, bacon, and lunch meats.  Maintain a healthy weight, or lose weight if recommended by your health care provider. General instructions  Do not smoke cigarettes.  Do not drink alcohol excessively.  Keep all follow-up visits as told by your health care provider. This is important. This includes keeping regularly scheduled colonoscopies. Talk to your health care provider about when you need a colonoscopy.  Exercise every day or as told by your health care provider. Contact a health care provider if:  You have new or worsening bleeding during a bowel movement.  You have new or increased blood in your stool.  You have a change in bowel habits.  You unexpectedly lose weight. This information is not intended to replace advice given to you by your health care provider. Make sure you discuss any questions you have with your health care provider. Document Released: 01/31/2004 Document Revised: 10/12/2015 Document Reviewed: 03/27/2015 Elsevier Interactive Patient Education  2018 [employer]. Resume aspirin on 03/20/2018. Resume other medications as before. Benefiber 4 g by mouth daily at bedtime. Kegel exercises as discussed. No driving for 24 hours. Physician will call with biopsy results.

## 2018-03-24 ENCOUNTER — Encounter (HOSPITAL_COMMUNITY): Payer: Self-pay | Admitting: Internal Medicine

## 2018-06-08 DIAGNOSIS — C189 Malignant neoplasm of colon, unspecified: Secondary | ICD-10-CM | POA: Diagnosis not present

## 2018-06-08 DIAGNOSIS — Z79899 Other long term (current) drug therapy: Secondary | ICD-10-CM | POA: Diagnosis not present

## 2018-06-08 DIAGNOSIS — I10 Essential (primary) hypertension: Secondary | ICD-10-CM | POA: Diagnosis not present

## 2018-06-08 DIAGNOSIS — G4700 Insomnia, unspecified: Secondary | ICD-10-CM | POA: Diagnosis not present

## 2018-06-15 DIAGNOSIS — Z85038 Personal history of other malignant neoplasm of large intestine: Secondary | ICD-10-CM | POA: Diagnosis not present

## 2018-06-15 DIAGNOSIS — Z23 Encounter for immunization: Secondary | ICD-10-CM | POA: Diagnosis not present

## 2018-06-15 DIAGNOSIS — E785 Hyperlipidemia, unspecified: Secondary | ICD-10-CM | POA: Diagnosis not present

## 2018-12-14 DIAGNOSIS — Z85038 Personal history of other malignant neoplasm of large intestine: Secondary | ICD-10-CM | POA: Diagnosis not present

## 2018-12-14 DIAGNOSIS — R030 Elevated blood-pressure reading, without diagnosis of hypertension: Secondary | ICD-10-CM | POA: Diagnosis not present

## 2019-06-16 DIAGNOSIS — Z85038 Personal history of other malignant neoplasm of large intestine: Secondary | ICD-10-CM | POA: Diagnosis not present

## 2019-06-16 DIAGNOSIS — I2510 Atherosclerotic heart disease of native coronary artery without angina pectoris: Secondary | ICD-10-CM | POA: Diagnosis not present

## 2019-06-16 DIAGNOSIS — I10 Essential (primary) hypertension: Secondary | ICD-10-CM | POA: Diagnosis not present

## 2019-07-12 DIAGNOSIS — Z79899 Other long term (current) drug therapy: Secondary | ICD-10-CM | POA: Diagnosis not present

## 2019-07-12 DIAGNOSIS — I10 Essential (primary) hypertension: Secondary | ICD-10-CM | POA: Diagnosis not present

## 2019-07-12 DIAGNOSIS — C189 Malignant neoplasm of colon, unspecified: Secondary | ICD-10-CM | POA: Diagnosis not present

## 2019-07-16 DIAGNOSIS — I10 Essential (primary) hypertension: Secondary | ICD-10-CM | POA: Diagnosis not present

## 2019-07-16 DIAGNOSIS — Z85038 Personal history of other malignant neoplasm of large intestine: Secondary | ICD-10-CM | POA: Diagnosis not present

## 2019-12-23 DIAGNOSIS — I872 Venous insufficiency (chronic) (peripheral): Secondary | ICD-10-CM | POA: Diagnosis not present

## 2020-02-22 DIAGNOSIS — H16223 Keratoconjunctivitis sicca, not specified as Sjogren's, bilateral: Secondary | ICD-10-CM | POA: Diagnosis not present

## 2020-02-22 DIAGNOSIS — Z961 Presence of intraocular lens: Secondary | ICD-10-CM | POA: Diagnosis not present

## 2020-02-22 DIAGNOSIS — H40023 Open angle with borderline findings, high risk, bilateral: Secondary | ICD-10-CM | POA: Diagnosis not present

## 2020-03-13 DIAGNOSIS — Z23 Encounter for immunization: Secondary | ICD-10-CM | POA: Diagnosis not present

## 2020-03-13 DIAGNOSIS — L089 Local infection of the skin and subcutaneous tissue, unspecified: Secondary | ICD-10-CM | POA: Diagnosis not present

## 2020-03-21 ENCOUNTER — Ambulatory Visit (HOSPITAL_COMMUNITY): Payer: Medicare Other | Attending: Internal Medicine | Admitting: Physical Therapy

## 2020-03-21 ENCOUNTER — Encounter (HOSPITAL_COMMUNITY): Payer: Self-pay | Admitting: Physical Therapy

## 2020-03-21 ENCOUNTER — Other Ambulatory Visit: Payer: Self-pay

## 2020-03-21 DIAGNOSIS — M79605 Pain in left leg: Secondary | ICD-10-CM | POA: Insufficient documentation

## 2020-03-21 DIAGNOSIS — S81801A Unspecified open wound, right lower leg, initial encounter: Secondary | ICD-10-CM | POA: Insufficient documentation

## 2020-03-21 DIAGNOSIS — S81802A Unspecified open wound, left lower leg, initial encounter: Secondary | ICD-10-CM | POA: Insufficient documentation

## 2020-03-21 DIAGNOSIS — M79661 Pain in right lower leg: Secondary | ICD-10-CM | POA: Diagnosis not present

## 2020-03-21 NOTE — Therapy (Signed)
[place], [place], [address] Phone: [phone]   Fax:  [phone]  Wound Care Evaluation  Patient Details  Name: Nattalie Santiesteban MRN: [id number] Date of Birth: 1932-03-17 Referring Provider (PT): Asencion Noble   Encounter Date: 03/21/2020   PT End of Session - 03/21/20 1613    Visit Number 1    Number of Visits 16    Date for PT Re-Evaluation 05/20/20    Authorization Type medicare    Progress Note Due on Visit 10    PT Start Time 1450    PT Stop Time 1600    PT Time Calculation (min) 70 min    Activity Tolerance Patient limited by pain    Behavior During Therapy [facility] for tasks assessed/performed           Past Medical History:  Diagnosis Date  . Arthritis    hands  . Carotid artery occlusion   . Colon cancer ([place])   . Headache(784.0)    occassional headaches  . Hypertension   . Malignant neoplasm of colon ([place])   . Medical history non-contributory     Past Surgical History:  Procedure Laterality Date  . ABDOMINAL HYSTERECTOMY    . APPENDECTOMY    . COLONOSCOPY N/A 08/26/2013   Procedure: COLONOSCOPY;  Surgeon: Rogene Houston, MD;  [facility];  Service: Endoscopy;  Laterality: N/A;  . COLONOSCOPY N/A 11/24/2014   Procedure: COLONOSCOPY;  Surgeon: Rogene Houston, MD;  [facility];  Service: Endoscopy;  Laterality: N/A;  1200  . COLONOSCOPY N/A 03/19/2018   Procedure: COLONOSCOPY;  Surgeon: Rogene Houston, MD;  [facility];  Service: Endoscopy;  Laterality: N/A;  830  . FRACTURE SURGERY     wrist  . INTRAOCULAR LENS IMPLANT, SECONDARY Bilateral 2007   bilateral  . PARTIAL COLECTOMY N/A 08/27/2013   Procedure: RIGHT HEMICOLECTOMY;  Surgeon: Jamesetta So, MD;  Location: [facility] OR[facility];  Service: General;  Laterality: N/A;  . POLYPECTOMY  03/19/2018   Procedure: POLYPECTOMY;  Surgeon: Rogene Houston, MD;  [facility];  Service: Endoscopy;;  colon  .  TONSILLECTOMY      There were no vitals filed for this visit.     [facility] PT Assessment - 03/21/20 0001      Assessment   Medical Diagnosis B LE ulcers     Referring Provider (PT) Asencion Noble    Next MD Visit Late November     Prior Therapy none       Precautions   Precautions None           Wound Therapy - 03/21/20 0001    Subjective Ms. Syverson states that she has had leg pain for quite a while.  Her legs feel better down and pain increases when she raises them.  Her pain is so high that she can not sleep at night, she can not remember when she has gotten a full night of sleep.  She feels that her wounds just appeared overnight.  They are very sensative to the point where she has pain with her pants hitting her legs.     Patient and Family Stated Goals To sleep better, have less pain and wounds to heal.     Date of Onset 11/07/19   approximate    Prior Treatments Pt has tried steroid cream, compression     Pain Scale 0-10    Pain Score 9  Pain Type Neuropathic pain    Pain Location Leg    Pain Orientation Right;Left    Pain Descriptors / Indicators Burning    Pain Onset With Activity    Patients Stated Pain Goal 0    Multiple Pain Sites Yes    Evaluation and Treatment Procedures Explained to Patient/Family Yes    Evaluation and Treatment Procedures agreed to    Wound Properties Date First Assessed: 03/21/20 Time First Assessed: 1501 Wound Type: Other (Comment) Location: Leg Location Orientation: Right;Anterior Wound Description (Comments): The pt has 3 ant wounds; sup 3x.8; mid 1x.8; inferior 1.28most superior   Dressing Type None    Dressing Changed New    Dressing Status None    Dressing Change Frequency PRN    Site / Wound Assessment Dry;Painful;Pale    % Wound base Red or Granulating 0%    % Wound base Yellow/Fibrinous Exudate 100%    Peri-wound Assessment Edema    Wound Length (cm) 3 cm    Wound Width (cm) 0.8 cm    Wound Depth (cm) --   unknown    Wound Surface Area  (cm^2) 2.4 cm^2    Drainage Amount None    Treatment Cleansed;Other (Comment)   manual    Wound Properties Date First Assessed: 03/21/20 Time First Assessed: 1525 Wound Type: Other (Comment) Location: Leg Location Orientation: Left Wound Description (Comments): 5 anterior sup to inferior; .8x.8; .8x.5;1.2x1.1,.7x.7,.3x.3 below is the largest  Present on Admission: Yes   Dressing Type None    Dressing Changed New    Dressing Status None    Dressing Change Frequency PRN    Site / Wound Assessment Dry;Painful;Yellow    % Wound base Red or Granulating 0%    % Wound base Yellow/Fibrinous Exudate 80%    % Wound base Other/Granulation Tissue (Comment) 20%    Wound Length (cm) 1.2 cm    Wound Width (cm) 1.1 cm    Wound Depth (cm) --   unknown    Wound Surface Area (cm^2) 1.32 cm^2    Drainage Amount None    Treatment Cleansed;Other (Comment)    Wound Properties Date First Assessed: 03/21/20 Time First Assessed: 1535 Wound Type: Other (Comment) Location: Leg Location Orientation: Lateral;Left Wound Description (Comments): lateral aspect of LE    Dressing Type None    Dressing Changed Changed    Dressing Change Frequency PRN    Site / Wound Assessment Dusky    % Wound base Black/Eschar 100%    Wound Length (cm) 1.7 cm    Wound Width (cm) 1.5 cm    Wound Depth (cm) --   unknown   Wound Surface Area (cm^2) 2.55 cm^2    Drainage Amount None    Treatment Cleansed;Other (Comment)    Wound Therapy - Clinical Statement Ms. Monaco is a pleasant 86 yo female who has B leg wounds with noted swelling.  The wounds are circular in nature, her LE are pale with bluish toes,she states that her legs feel better when they are down with increased pain with elevation.  Due to the above therapist contacted MD office and requested an ABI be ordered to find out what the status of the patients arterial circulation in her LE>  There are also noted varicosities in both ankles.  Ms jonisha kindig a high amount of pain  stating that she can not remember when she has been able to sleep; the therapist conveyed this to the MD as well.  Ms. Oehlert most likely has  mixed arterial/venous flow.  At this time debridement was unable to be completed due to high amount of pain.  The therapist completed light decongestive techniques to B LE but did not use any type of compression dressing until we recieve the results of the ABI.   We will use medihoney to loosen the eschar on the pt wounds and debride as pt tolerance allows.      Wound Therapy - Functional Problem List diffiiculty with walking, unable to sleep      Factors Delaying/Impairing Wound Healing Vascular compromise    Hydrotherapy Plan Debridement;Dressing change;Patient/family education    Wound Therapy - Frequency 2X / week   x 8 weeks    Wound Therapy - Current Recommendations PT;Other (comment)   ABI, medication for pain    Dressing  medihoney, 2x2 kerlix and netting on RT; medihoney, xeroform, 2x2, kerlix and netting on Lt.     Manual Therapy gentle decongestive techniques to decrease swelling                  Objective measurements completed on examination: See above findings.             PT Education - 03/21/20 1612    Education Details That we will call MD about an ABI and try and get her medication to help with debridement and pain    Person(s) Educated Patient    Methods Explanation    Comprehension Verbalized understanding            PT Short Term Goals - 03/21/20 1708      PT SHORT TERM GOAL #1   Title PT to have only one wound on hre Rt LE and 3 wounds on her LT to decrease risk of infection.    Time 4    Period Weeks    Status New    Target Date 04/18/20      PT SHORT TERM GOAL #2   Title PT pain to have decreased to no greater than a 6/10 to allow pt to be able to sleep at least 3 hours at a time.    Time 4    Period Weeks    Status New             PT Long Term Goals - 03/21/20 1711      PT LONG TERM GOAL #1    Title All wounds to be healed on both LE    Time 8    Period Weeks    Status New    Target Date 05/16/20      PT LONG TERM GOAL #2   Title PT pain to be no greater than a 4/10 to allow pt to obtrain 5 hrs of sleep at one time.    Time 8    Period Weeks    Status New      PT LONG TERM GOAL #3   Title If ABI is normal pt to be wearing compression garment of 20-30; if slightly abnormal 15-20 .    Time 8    Period Weeks    Status New                Plan - 03/21/20 1614    Clinical Impression Statement see abofe    Personal Factors and Comorbidities Age    Examination-Activity Limitations Dressing;Locomotion Level    Examination-Participation Restrictions Cleaning    Stability/Clinical Decision Making Evolving/Moderate complexity    Clinical Decision Making Moderate    Rehab Potential  Good    PT Frequency 2x / week    PT Duration 8 weeks    PT Treatment/Interventions ADLs/Self Care Home Management;Patient/family education;Manual techniques;Other (comment)    PT Next Visit Plan check ABI status, manual for edema, depending on ABI may increase compression for edema, attempt debridement if you can.    PT Home Exercise Plan ankle pumps    Consulted and Agree with Plan of Care Patient           Patient will benefit from skilled therapeutic intervention in order to improve the following deficits and impairments:  Pain, Decreased skin integrity, Increased edema  Visit Diagnosis: Pain in left leg  Pain in right lower leg  Multiple open wounds of right lower leg  Multiple open wounds of left lower leg    Problem List Patient Active Problem List   Diagnosis Date Noted  . History of colon cancer 12/24/2017  . Colon cancer ([place]) 09/11/2013  . Small bowel obstruction ([place]) 08/25/2013  . Tachycardia 08/25/2013  . Lesion of colon 08/25/2013  . Anemia 08/25/2013  . SBO (small bowel obstruction) [facility]) 08/25/2013    Rayetta Humphrey, PT CLT [phone] i 03/21/2020,  5:15 PM  [place] [address] [place], [place], [address] Phone: [phone]   Fax:  [phone]  Name: Mollie Rossano MRN: [id number] Date of Birth: 16-May-1932

## 2020-03-23 ENCOUNTER — Other Ambulatory Visit: Payer: Self-pay | Admitting: Internal Medicine

## 2020-03-23 ENCOUNTER — Ambulatory Visit (HOSPITAL_COMMUNITY): Payer: Medicare Other

## 2020-03-23 ENCOUNTER — Other Ambulatory Visit (HOSPITAL_COMMUNITY): Payer: Self-pay | Admitting: Internal Medicine

## 2020-03-23 DIAGNOSIS — I739 Peripheral vascular disease, unspecified: Secondary | ICD-10-CM

## 2020-03-24 ENCOUNTER — Ambulatory Visit (HOSPITAL_COMMUNITY): Payer: Medicare Other

## 2020-03-24 ENCOUNTER — Encounter (HOSPITAL_COMMUNITY): Payer: Self-pay

## 2020-03-24 ENCOUNTER — Other Ambulatory Visit: Payer: Self-pay

## 2020-03-24 DIAGNOSIS — M79661 Pain in right lower leg: Secondary | ICD-10-CM

## 2020-03-24 DIAGNOSIS — S81802A Unspecified open wound, left lower leg, initial encounter: Secondary | ICD-10-CM | POA: Diagnosis not present

## 2020-03-24 DIAGNOSIS — S81801A Unspecified open wound, right lower leg, initial encounter: Secondary | ICD-10-CM | POA: Diagnosis not present

## 2020-03-24 DIAGNOSIS — M79605 Pain in left leg: Secondary | ICD-10-CM | POA: Diagnosis not present

## 2020-03-24 NOTE — Therapy (Signed)
[place], [place], [address] Phone: [phone]   Fax:  [phone]  Wound Care Therapy  Patient Details  Name: [name] [name] MRN: [id number] Date of Birth: [birth year] Referring Provider (PT): Asencion Noble   Encounter Date: 03/24/2020   PT End of Session - 03/24/20 1542    Visit Number 2    Number of Visits 16    Date for PT Re-Evaluation 05/20/20    Authorization Type medicare    Progress Note Due on Visit 10    PT Start Time 1450    PT Stop Time 1530    PT Time Calculation (min) 40 min    Activity Tolerance Patient limited by pain    Behavior During Therapy [facility] for tasks assessed/performed           Past Medical History:  Diagnosis Date  . Arthritis    hands  . Carotid artery occlusion   . Colon cancer ([place])   . Headache(784.0)    occassional headaches  . Hypertension   . Malignant neoplasm of colon ([place])   . Medical history non-contributory     Past Surgical History:  Procedure Laterality Date  . ABDOMINAL HYSTERECTOMY    . APPENDECTOMY    . COLONOSCOPY N/A 08/26/2013   Procedure: COLONOSCOPY;  Surgeon: Rogene Houston, MD;  [facility];  Service: Endoscopy;  Laterality: N/A;  . COLONOSCOPY N/A 11/24/2014   Procedure: COLONOSCOPY;  Surgeon: Rogene Houston, MD;  [facility];  Service: Endoscopy;  Laterality: N/A;  1200  . COLONOSCOPY N/A 03/19/2018   Procedure: COLONOSCOPY;  Surgeon: Rogene Houston, MD;  [facility];  Service: Endoscopy;  Laterality: N/A;  830  . FRACTURE SURGERY     wrist  . INTRAOCULAR LENS IMPLANT, SECONDARY Bilateral 2007   bilateral  . PARTIAL COLECTOMY N/A 08/27/2013   Procedure: RIGHT HEMICOLECTOMY;  Surgeon: Jamesetta So, MD;  Location: [facility];  Service: General;  Laterality: N/A;  . POLYPECTOMY  03/19/2018   Procedure: POLYPECTOMY;  Surgeon: Rogene Houston, MD;  [facility];  Service: Endoscopy;;  colon  .  TONSILLECTOMY      There were no vitals filed for this visit.    Subjective Assessment - 03/24/20 1535    Subjective Pt stated she feels burning down to the bone, pain scale 12/10.    Currently in Pain? Yes    Pain Score 10-Worst pain ever    Pain Location Leg    Pain Orientation Right;Left    Pain Descriptors / Indicators Burning    Pain Type Neuropathic pain    Pain Onset More than a month ago                     Wound Therapy - 03/24/20 1535    Subjective Pt stated she feels burning down to the bone, pain scale 12/10.    Patient and Family Stated Goals To sleep better, have less pain and wounds to heal.     Date of Onset 11/07/19   approximate   Prior Treatments Pt has tried steroid cream, compression     Pain Scale 0-10    Pain Onset With Activity    Patients Stated Pain Goal 0    Multiple Pain Sites Yes    Evaluation and Treatment Procedures Explained to Patient/Family Yes    Evaluation and Treatment Procedures agreed to    Wound Properties Date First  Assessed: 03/21/20 Time First Assessed: 1501 Wound Type: Other (Comment) Location: Leg Location Orientation: Right;Anterior Wound Description (Comments): The pt has 3 ant wounds; sup 3x.8; mid 1x.8; inferior 1.75most superior   Dressing Type Gauze (Comment)   medihoney, xeroform, kerlix, netting   Dressing Changed New    Dressing Status Clean;Dry;Intact    Dressing Change Frequency PRN    Site / Wound Assessment Dry;Painful    % Wound base Red or Granulating 0%    % Wound base Yellow/Fibrinous Exudate 100%    Peri-wound Assessment Edema    Drainage Amount None    Treatment Cleansed   Manual lymph massage   Wound Properties Date First Assessed: 03/21/20 Time First Assessed: 1525 Wound Type: Other (Comment) Location: Leg Location Orientation: Left Wound Description (Comments): 5 anterior sup to inferior; .8x.8; .8x.5;1.2x1.1,.7x.7,.3x.3 below is the largest  Present on Admission: Yes   Dressing Type Gauze  (Comment);Impregnated gauze (bismuth)   medihoney, xeroform, kerlix, netting   Dressing Changed New    Dressing Status Clean;Dry    Dressing Change Frequency PRN    Site / Wound Assessment Dry;Painful    % Wound base Red or Granulating 0%    % Wound base Yellow/Fibrinous Exudate 80%    % Wound base Other/Granulation Tissue (Comment) 20%    Drainage Amount None    Treatment Cleansed;Other (Comment)   Medihoney, xeroform, kerlix, netting   Wound Properties Date First Assessed: 03/21/20 Time First Assessed: 1535 Wound Type: Other (Comment) Location: Leg Location Orientation: Lateral;Left Wound Description (Comments): lateral aspect of LE    Dressing Type Gauze (Comment)   medihoney, xeroform, kerlix, netting   Dressing Changed New    Dressing Change Frequency PRN    Site / Wound Assessment Dusky    % Wound base Black/Eschar 100%    Drainage Amount None    Treatment Cleansed;Other (Comment)   light lymph massage   Wound Therapy - Clinical Statement Pt limited by pain, ABI apt schedule for 03/28/20.  No debridement complete this session.  Manual light touch lymph massage complete for edema control.  Pt educated on keeping wounds covered, hard of hearing so unsure if she understood though did verbalize understanding.      Wound Therapy - Functional Problem List diffiiculty with walking, unable to sleep      Factors Delaying/Impairing Wound Healing Vascular compromise    Hydrotherapy Plan Debridement;Dressing change;Patient/family education    Wound Therapy - Frequency 2X / week   8 weeks   Wound Therapy - Current Recommendations PT;Other (comment)   ABI, medication for pain   Dressing  medihoney, 2x2 kerlix and netting on RT; medihoney, xeroform, 2x2, kerlix and netting on Lt.     Manual Therapy gentle decongestive techniques to decrease swelling                      PT Short Term Goals - 03/21/20 1708      PT SHORT TERM GOAL #1   Title PT to have only one wound on hre Rt LE and 3  wounds on her LT to decrease risk of infection.    Time 4    Period Weeks    Status New    Target Date 04/18/20      PT SHORT TERM GOAL #2   Title PT pain to have decreased to no greater than a 6/10 to allow pt to be able to sleep at least 3 hours at a time.    Time 4  Period Weeks    Status New             PT Long Term Goals - 03/21/20 1711      PT LONG TERM GOAL #1   Title All wounds to be healed on both LE    Time 8    Period Weeks    Status New    Target Date 05/16/20      PT LONG TERM GOAL #2   Title PT pain to be no greater than a 4/10 to allow pt to obtrain 5 hrs of sleep at one time.    Time 8    Period Weeks    Status New      PT LONG TERM GOAL #3   Title If ABI is normal pt to be wearing compression garment of 20-30; if slightly abnormal 15-20 .    Time 8    Period Weeks    Status New                  Patient will benefit from skilled therapeutic intervention in order to improve the following deficits and impairments:     Visit Diagnosis: Pain in right lower leg  Multiple open wounds of right lower leg  Multiple open wounds of left lower leg  Pain in left leg     Problem List Patient Active Problem List   Diagnosis Date Noted  . History of colon cancer 12/24/2017  . Colon cancer ([place]) 09/11/2013  . Small bowel obstruction ([place]) 08/25/2013  . Tachycardia 08/25/2013  . Lesion of colon 08/25/2013  . Anemia 08/25/2013  . SBO (small bowel obstruction) ([place]) 08/25/2013   Ihor Austin, LPTA/CLT; CBIS [phone]  Aldona Lento 03/24/2020, 3:44 PM  [facility] [address] [place], [place], [address] Phone: [phone]   Fax:  [phone]  Name: [name] [name] MRN: [id number] Date of Birth: May 07, 1932

## 2020-03-28 ENCOUNTER — Ambulatory Visit (HOSPITAL_COMMUNITY)
Admission: RE | Admit: 2020-03-28 | Discharge: 2020-03-28 | Disposition: A | Discharge status: Home / Self Care | Payer: Medicare Other | Source: Ambulatory Visit | Attending: Internal Medicine | Admitting: Internal Medicine

## 2020-03-28 ENCOUNTER — Ambulatory Visit (HOSPITAL_COMMUNITY): Payer: Medicare Other | Admitting: Physical Therapy

## 2020-03-28 ENCOUNTER — Other Ambulatory Visit (HOSPITAL_COMMUNITY): Payer: Medicare Other

## 2020-03-28 ENCOUNTER — Other Ambulatory Visit: Payer: Self-pay

## 2020-03-28 DIAGNOSIS — I739 Peripheral vascular disease, unspecified: Secondary | ICD-10-CM | POA: Diagnosis present

## 2020-03-29 ENCOUNTER — Telehealth (HOSPITAL_COMMUNITY): Payer: Self-pay | Admitting: Physical Therapy

## 2020-03-29 ENCOUNTER — Ambulatory Visit (HOSPITAL_COMMUNITY): Payer: Medicare Other | Admitting: Physical Therapy

## 2020-03-29 ENCOUNTER — Encounter (HOSPITAL_COMMUNITY): Payer: Self-pay | Admitting: Physical Therapy

## 2020-03-29 DIAGNOSIS — M79661 Pain in right lower leg: Secondary | ICD-10-CM

## 2020-03-29 DIAGNOSIS — S81802A Unspecified open wound, left lower leg, initial encounter: Secondary | ICD-10-CM

## 2020-03-29 DIAGNOSIS — M79605 Pain in left leg: Secondary | ICD-10-CM | POA: Diagnosis not present

## 2020-03-29 DIAGNOSIS — S81801A Unspecified open wound, right lower leg, initial encounter: Secondary | ICD-10-CM | POA: Diagnosis not present

## 2020-03-29 NOTE — Telephone Encounter (Signed)
Pt husband called stating that pt was having a significant amount of pain with the [place].  Therapist requested husband to take the netting off, remove the top two layers off then replace the netting.  Husband reported that he could do this.  Rayetta Humphrey, [place] CLT [phone]

## 2020-03-29 NOTE — Therapy (Signed)
[place], [place], [address] Phone: [phone]   Fax:  [phone]  Wound Care Therapy  Patient Details  Name: [name] [name] MRN: [id number] Date of Birth: 29-Jul-1931 Referring Provider (PT): Asencion Noble   Encounter Date: 03/29/2020   PT End of Session - 03/29/20 1144    Visit Number 3    Number of Visits 16    Date for PT Re-Evaluation 05/20/20    Authorization Type medicare    Progress Note Due on Visit 10    PT Start Time 0174    PT Stop Time 1130    PT Time Calculation (min) 45 min    Activity Tolerance Patient limited by pain    Behavior During Therapy [facility] for tasks assessed/performed           Past Medical History:  Diagnosis Date  . Arthritis    hands  . Carotid artery occlusion   . Colon cancer ([place])   . Headache(784.0)    occassional headaches  . Hypertension   . Malignant neoplasm of colon ([place])   . Medical history non-contributory     Past Surgical History:  Procedure Laterality Date  . ABDOMINAL HYSTERECTOMY    . APPENDECTOMY    . COLONOSCOPY N/A 08/26/2013   Procedure: COLONOSCOPY;  Surgeon: Rogene Houston, MD;  [facility];  Service: Endoscopy;  Laterality: N/A;  . COLONOSCOPY N/A 11/24/2014   Procedure: COLONOSCOPY;  Surgeon: Rogene Houston, MD;  [facility];  Service: Endoscopy;  Laterality: N/A;  1200  . COLONOSCOPY N/A 03/19/2018   Procedure: COLONOSCOPY;  Surgeon: Rogene Houston, MD;  [facility];  Service: Endoscopy;  Laterality: N/A;  830  . FRACTURE SURGERY     wrist  . INTRAOCULAR LENS IMPLANT, SECONDARY Bilateral 2007   bilateral  . PARTIAL COLECTOMY N/A 08/27/2013   Procedure: RIGHT HEMICOLECTOMY;  Surgeon: Jamesetta So, MD;  Location: [facility];  Service: General;  Laterality: N/A;  . POLYPECTOMY  03/19/2018   Procedure: POLYPECTOMY;  Surgeon: Rogene Houston, MD;  [facility];  Service: Endoscopy;;  colon  .  TONSILLECTOMY      There were no vitals filed for this visit.               Wound Therapy - 03/29/20 0001    Subjective Pt stated she feels burning down to the bone, pain scale 12/10.    Patient and Family Stated Goals To sleep better, have less pain and wounds to heal.     Date of Onset 11/07/19   approximate   Prior Treatments Pt has tried steroid cream, compression     Pain Scale 0-10    Pain Score 9     Pain Type Acute pain    Pain Location Leg    Pain Orientation Right;Left;Anterior    Pain Descriptors / Indicators Burning    Pain Onset With Activity    Patients Stated Pain Goal 0    Multiple Pain Sites Yes    Evaluation and Treatment Procedures Explained to Patient/Family Yes    Evaluation and Treatment Procedures agreed to    Wound Properties Date First Assessed: 03/21/20 Time First Assessed: 1501 Wound Type: Other (Comment) Location: Leg Location Orientation: Right;Anterior Wound Description (Comments): The pt has 3 ant wounds; sup 3x.8; mid 1x.8; inferior 1.15most superior   Dressing Type Compression wrap    Dressing Changed New    Dressing Status  Clean;Dry    Dressing Change Frequency PRN    Site / Wound Assessment Dry;Painful;Pale    % Wound base Red or Granulating 0%    % Wound base Yellow/Fibrinous Exudate 100%    Peri-wound Assessment Edema    Drainage Amount None    Treatment Cleansed;Debridement (Selective)    Wound Properties Date First Assessed: 03/21/20 Time First Assessed: 1525 Wound Type: Other (Comment) Location: Leg Location Orientation: Left Wound Description (Comments): 5 anterior sup to inferior; .8x.8; .8x.5;1.2x1.1,.7x.7,.3x.3 below is the largest  Present on Admission: Yes   Dressing Type None    Dressing Changed New    Dressing Status Clean;Dry    Site / Wound Assessment Dry;Painful    % Wound base Red or Granulating 0%    % Wound base Yellow/Fibrinous Exudate 100%    Drainage Amount None    Treatment Cleansed;Debridement  (Selective);Other (Comment)    Wound Properties Date First Assessed: 03/21/20 Time First Assessed: 1535 Wound Type: Other (Comment) Location: Leg Location Orientation: Lateral;Left Wound Description (Comments): lateral aspect of LE    Dressing Type None    Dressing Changed New    Dressing Change Frequency PRN    Site / Wound Assessment Dusky    % Wound base Yellow/Fibrinous Exudate 80%    % Wound base Black/Eschar 20%    Drainage Amount None    Treatment Cleansed;Debridement (Selective)    Selective Debridement - Location wound bed to remove yellow slough     Selective Debridement - Tools Used Forceps;Scalpel;Scissors    Selective Debridement - Tissue Removed slough     Wound Therapy - Clinical Statement Pt test came back negative for arterial obsturction.  Therapist began debridement as well as changing dressing to profore for increased compression.     Wound Therapy - Functional Problem List diffiiculty with walking, unable to sleep      Factors Delaying/Impairing Wound Healing Vascular compromise    Hydrotherapy Plan Debridement;Dressing change;Patient/family education    Wound Therapy - Frequency 2X / week   8 weeks   Wound Therapy - Current Recommendations PT;Other (comment)   ABI, medication for pain   Dressing  medihoney, 4x4 f.b profore    Manual Therapy gentle decongestive techniques to decrease swelling                      PT Short Term Goals - 03/29/20 1146      PT SHORT TERM GOAL #1   Title PT to have only one wound on hre Rt LE and 3 wounds on her LT to decrease risk of infection.    Time 4    Period Weeks    Status On-going    Target Date 04/18/20      PT SHORT TERM GOAL #2   Title PT pain to have decreased to no greater than a 6/10 to allow pt to be able to sleep at least 3 hours at a time.    Time 4    Period Weeks    Status On-going             PT Long Term Goals - 03/29/20 1146      PT LONG TERM GOAL #1   Title All wounds to be healed on  both LE    Time 8    Period Weeks    Status On-going      PT LONG TERM GOAL #2   Title PT pain to be no greater than a 4/10 to allow  pt to obtrain 5 hrs of sleep at one time.    Time 8    Period Weeks    Status On-going      PT LONG TERM GOAL #3   Title If ABI is normal pt to be wearing compression garment of 20-30; if slightly abnormal 15-20 .    Time 8    Period Weeks    Status On-going                 Plan - 03/29/20 1145    Clinical Impression Statement see above    Personal Factors and Comorbidities Age    Examination-Activity Limitations Dressing;Locomotion Level    Examination-Participation Restrictions Cleaning    Stability/Clinical Decision Making Evolving/Moderate complexity    Rehab Potential Good    PT Frequency 2x / week    PT Duration 8 weeks    PT Treatment/Interventions ADLs/Self Care Home Management;Patient/family education;Manual techniques;Other (comment)    PT Next Visit Plan manual for edema,debridement and compression dressing    PT Home Exercise Plan ankle pumps    Consulted and Agree with Plan of Care Patient           Patient will benefit from skilled therapeutic intervention in order to improve the following deficits and impairments:  Pain, Decreased skin integrity, Increased edema  Visit Diagnosis: Pain in right lower leg  Multiple open wounds of right lower leg  Multiple open wounds of left lower leg  Pain in left leg     Problem List Patient Active Problem List   Diagnosis Date Noted  . History of colon cancer 12/24/2017  . Colon cancer ([place]) 09/11/2013  . Small bowel obstruction ([place]) 08/25/2013  . Tachycardia 08/25/2013  . Lesion of colon 08/25/2013  . Anemia 08/25/2013  . SBO (small bowel obstruction) [facility]) 08/25/2013   Rayetta Humphrey, PT CLT [phone] 03/29/2020, 11:47 AM  [facility] [address] [place], [place], [address] Phone: [phone]   Fax:   [phone]  Name: [name] [name] MRN: [id number] Date of Birth: 01-05-1932

## 2020-03-31 ENCOUNTER — Ambulatory Visit (HOSPITAL_COMMUNITY): Payer: Medicare Other | Admitting: Physical Therapy

## 2020-03-31 ENCOUNTER — Other Ambulatory Visit: Payer: Self-pay

## 2020-03-31 DIAGNOSIS — M79661 Pain in right lower leg: Secondary | ICD-10-CM | POA: Diagnosis not present

## 2020-03-31 DIAGNOSIS — M79605 Pain in left leg: Secondary | ICD-10-CM

## 2020-03-31 DIAGNOSIS — S81802A Unspecified open wound, left lower leg, initial encounter: Secondary | ICD-10-CM

## 2020-03-31 DIAGNOSIS — I6523 Occlusion and stenosis of bilateral carotid arteries: Secondary | ICD-10-CM

## 2020-03-31 DIAGNOSIS — S81801A Unspecified open wound, right lower leg, initial encounter: Secondary | ICD-10-CM | POA: Diagnosis not present

## 2020-03-31 NOTE — Therapy (Signed)
[place], [place], [address] Phone: [phone]   Fax:  [phone]  Wound Care Therapy  Patient Details  Name: [name] [name] MRN: [id number] Date of Birth: [birth year] Referring Provider (PT): Asencion Noble   Encounter Date: 03/31/2020   PT End of Session - 03/31/20 1231    Visit Number 4    Number of Visits 16    Date for PT Re-Evaluation 05/20/20    Authorization Type medicare    Progress Note Due on Visit 10    PT Start Time 5329    PT Stop Time 1130    PT Time Calculation (min) 45 min    Activity Tolerance Patient limited by pain    Behavior During Therapy [facility] for tasks assessed/performed           Past Medical History:  Diagnosis Date  . Arthritis    hands  . Carotid artery occlusion   . Colon cancer ([place])   . Headache(784.0)    occassional headaches  . Hypertension   . Malignant neoplasm of colon ([place])   . Medical history non-contributory     Past Surgical History:  Procedure Laterality Date  . ABDOMINAL HYSTERECTOMY    . APPENDECTOMY    . COLONOSCOPY N/A 08/26/2013   Procedure: COLONOSCOPY;  Surgeon: Rogene Houston, MD;  [facility];  Service: Endoscopy;  Laterality: N/A;  . COLONOSCOPY N/A 11/24/2014   Procedure: COLONOSCOPY;  Surgeon: Rogene Houston, MD;  [facility];  Service: Endoscopy;  Laterality: N/A;  1200  . COLONOSCOPY N/A 03/19/2018   Procedure: COLONOSCOPY;  Surgeon: Rogene Houston, MD;  [facility];  Service: Endoscopy;  Laterality: N/A;  830  . FRACTURE SURGERY     wrist  . INTRAOCULAR LENS IMPLANT, SECONDARY Bilateral 2007   bilateral  . PARTIAL COLECTOMY N/A 08/27/2013   Procedure: RIGHT HEMICOLECTOMY;  Surgeon: Jamesetta So, MD;  Location: [facility];  Service: General;  Laterality: N/A;  . POLYPECTOMY  03/19/2018   Procedure: POLYPECTOMY;  Surgeon: Rogene Houston, MD;  [facility];  Service: Endoscopy;;  colon  .  TONSILLECTOMY      There were no vitals filed for this visit.               Wound Therapy - 03/31/20 0001    Subjective Pt  had called 2 hrs after last session with significant increased pain.  Therapist instructed pt husband to take the two superfical layers of profore dressing off, pt states thtat this decreased her pain significantly.  states that she got gabapentin yesterday she is not sure if it is helping or not .    Patient and Family Stated Goals To sleep better, have less pain and wounds to heal.     Date of Onset 11/07/19   approximate   Prior Treatments Pt has tried steroid cream, compression     Pain Scale 0-10    Pain Score 8     Pain Type Acute pain    Pain Location Leg    Pain Orientation Right;Left    Pain Descriptors / Indicators Burning    Pain Onset With Activity    Multiple Pain Sites Yes   both legs    Evaluation and Treatment Procedures Explained to Patient/Family Yes    Evaluation and Treatment Procedures agreed to    Wound Properties Date First Assessed: 03/21/20 Time First Assessed: 1501 Wound Type: Other (Comment)  Location: Leg Location Orientation: Right;Anterior Wound Description (Comments): The pt has 3 ant wounds; sup 3x.8; mid 1x.8; inferior 1.83most superior   Dressing Type Gauze (Comment)    Dressing Changed Changed    Dressing Status Old drainage    Dressing Change Frequency PRN    Site / Wound Assessment Painful    % Wound base Red or Granulating 0%    % Wound base Yellow/Fibrinous Exudate 100%    Peri-wound Assessment Edema    Margins Epibole (rolled edges)    Drainage Amount Minimal    Treatment Cleansed;Debridement (Selective)    Wound Properties Date First Assessed: 03/21/20 Time First Assessed: 1525 Wound Type: Other (Comment) Location: Leg Location Orientation: Left Wound Description (Comments): 5 anterior sup to inferior; .8x.8; .8x.5;1.2x1.1,.7x.7,.3x.3 below is the largest  Present on Admission: Yes   Dressing Type Gauze  (Comment)    Dressing Changed Changed    Dressing Status Old drainage    Site / Wound Assessment Painful    % Wound base Red or Granulating 0%    % Wound base Yellow/Fibrinous Exudate 100%    Peri-wound Assessment Erythema (blanchable);Induration    Drainage Amount Minimal    Treatment Cleansed;Debridement (Selective)    Wound Properties Date First Assessed: 03/21/20 Time First Assessed: 1535 Wound Type: Other (Comment) Location: Leg Location Orientation: Lateral;Left Wound Description (Comments): lateral aspect of LE    Dressing Type Gauze (Comment)    Dressing Changed Changed    Dressing Change Frequency PRN    Site / Wound Assessment Yellow    % Wound base Yellow/Fibrinous Exudate 95%    % Wound base Black/Eschar 5%    Peri-wound Assessment Erythema (blanchable);Induration    Drainage Amount Minimal    Treatment Cleansed;Debridement (Selective)    Selective Debridement - Location wound bed to remove yellow slough     Selective Debridement - Tools Used Forceps;Scalpel;Scissors    Selective Debridement - Tissue Removed slough     Wound Therapy - Clinical Statement Therapist used profore compression last treatment as ABI came back good, however compression dressing significantly increased pt pain.  This was discussed with Dr. Bethanie Dicker who is going to refer her to a vascular surgeon.  Medihoney has loosened slough and therapist is able to remove a greater amount of slough causing a thinner layer on pt wound bed although there is still no granulation tissue visible. Pt continues to have high pain but tolerated treatment better thererfore therapist feels gabapentin is helping pt.      Wound Therapy - Functional Problem List diffiiculty with walking, unable to sleep      Factors Delaying/Impairing Wound Healing Vascular compromise    Hydrotherapy Plan Debridement;Dressing change;Patient/family education    Wound Therapy - Frequency 2X / week   8 weeks   Wound Therapy - Current  Recommendations PT;Other (comment)   ABI, medication for pain   Dressing  medihoney, 4x4 f.b kerlix and netting     Manual Therapy gentle decongestive techniques to decrease swelling                      PT Short Term Goals - 03/29/20 1146      PT SHORT TERM GOAL #1   Title PT to have only one wound on hre Rt LE and 3 wounds on her LT to decrease risk of infection.    Time 4    Period Weeks    Status On-going    Target Date 04/18/20  PT SHORT TERM GOAL #2   Title PT pain to have decreased to no greater than a 6/10 to allow pt to be able to sleep at least 3 hours at a time.    Time 4    Period Weeks    Status On-going             PT Long Term Goals - 03/29/20 1146      PT LONG TERM GOAL #1   Title All wounds to be healed on both LE    Time 8    Period Weeks    Status On-going      PT LONG TERM GOAL #2   Title PT pain to be no greater than a 4/10 to allow pt to obtrain 5 hrs of sleep at one time.    Time 8    Period Weeks    Status On-going      PT LONG TERM GOAL #3   Title If ABI is normal pt to be wearing compression garment of 20-30; if slightly abnormal 15-20 .    Time 8    Period Weeks    Status On-going                 Plan - 03/31/20 1232    Clinical Impression Statement see above    Personal Factors and Comorbidities Age    Examination-Activity Limitations Dressing;Locomotion Level    Examination-Participation Restrictions Cleaning    Stability/Clinical Decision Making Evolving/Moderate complexity    Rehab Potential Good    PT Frequency 2x / week    PT Duration 8 weeks    PT Treatment/Interventions ADLs/Self Care Home Management;Patient/family education;Manual techniques;Other (comment)    PT Next Visit Plan manual for edema,debridement and compression dressing    PT Home Exercise Plan ankle pumps    Consulted and Agree with Plan of Care Patient           Patient will benefit from skilled therapeutic intervention in order  to improve the following deficits and impairments:  Pain, Decreased skin integrity, Increased edema  Visit Diagnosis: Pain in right lower leg  Multiple open wounds of right lower leg  Multiple open wounds of left lower leg  Pain in left leg     Problem List Patient Active Problem List   Diagnosis Date Noted  . History of colon cancer 12/24/2017  . Colon cancer ([place]) 09/11/2013  . Small bowel obstruction ([place]) 08/25/2013  . Tachycardia 08/25/2013  . Lesion of colon 08/25/2013  . Anemia 08/25/2013  . SBO (small bowel obstruction) [facility]) 08/25/2013    Rayetta Humphrey, PT CLT [phone] 03/31/2020, 12:32 PM  [facility] [address] [place], [place], [address] Phone: [phone]   Fax:  [phone]  Name: [name] [name] MRN: [id number] Date of Birth: 1931-09-01

## 2020-04-04 ENCOUNTER — Other Ambulatory Visit: Payer: Self-pay

## 2020-04-04 ENCOUNTER — Encounter (HOSPITAL_COMMUNITY): Payer: Self-pay | Admitting: Physical Therapy

## 2020-04-04 ENCOUNTER — Ambulatory Visit (HOSPITAL_COMMUNITY): Payer: Medicare Other | Admitting: Physical Therapy

## 2020-04-04 DIAGNOSIS — M79661 Pain in right lower leg: Secondary | ICD-10-CM

## 2020-04-04 DIAGNOSIS — S81801A Unspecified open wound, right lower leg, initial encounter: Secondary | ICD-10-CM | POA: Diagnosis not present

## 2020-04-04 DIAGNOSIS — S81802A Unspecified open wound, left lower leg, initial encounter: Secondary | ICD-10-CM

## 2020-04-04 DIAGNOSIS — M79605 Pain in left leg: Secondary | ICD-10-CM | POA: Diagnosis not present

## 2020-04-04 NOTE — Therapy (Signed)
[facility] [address] [place], [place], [address] Phone: [phone]   Fax:  [phone]  Physical Therapy Treatment  Patient Details  Name: [name] [name] MRN: [id number] Date of Birth: 1932/04/25 Referring Provider (PT): Asencion Noble   Encounter Date: 04/04/2020   PT End of Session - 04/04/20 1603    Visit Number 5    Number of Visits 16    Date for PT Re-Evaluation 05/20/20    Authorization Type medicare    Progress Note Due on Visit 10    PT Start Time 1448    PT Stop Time 1531    PT Time Calculation (min) 43 min    Activity Tolerance Patient limited by pain    Behavior During Therapy [facility] for tasks assessed/performed           Past Medical History:  Diagnosis Date  . Arthritis    hands  . Carotid artery occlusion   . Colon cancer ([place])   . Headache(784.0)    occassional headaches  . Hypertension   . Malignant neoplasm of colon ([place])   . Medical history non-contributory     Past Surgical History:  Procedure Laterality Date  . ABDOMINAL HYSTERECTOMY    . APPENDECTOMY    . COLONOSCOPY N/A 08/26/2013   Procedure: COLONOSCOPY;  Surgeon: Rogene Houston, MD;  [facility];  Service: Endoscopy;  Laterality: N/A;  . COLONOSCOPY N/A 11/24/2014   Procedure: COLONOSCOPY;  Surgeon: Rogene Houston, MD;  [facility];  Service: Endoscopy;  Laterality: N/A;  1200  . COLONOSCOPY N/A 03/19/2018   Procedure: COLONOSCOPY;  Surgeon: Rogene Houston, MD;  [facility];  Service: Endoscopy;  Laterality: N/A;  830  . FRACTURE SURGERY     wrist  . INTRAOCULAR LENS IMPLANT, SECONDARY Bilateral 2007   bilateral  . PARTIAL COLECTOMY N/A 08/27/2013   Procedure: RIGHT HEMICOLECTOMY;  Surgeon: Jamesetta So, MD;  Location: [facility];  Service: General;  Laterality: N/A;  . POLYPECTOMY  03/19/2018   Procedure: POLYPECTOMY;  Surgeon: Rogene Houston, MD;  [facility];  Service: Endoscopy;;  colon  .  TONSILLECTOMY      There were no vitals filed for this visit.                    Wound Therapy - 04/04/20 0001    Subjective Patient was able to clean her legs and change her own dressing because the other one drained through.    Patient and Family Stated Goals To sleep better, have less pain and wounds to heal.     Date of Onset 11/07/19   approximate   Prior Treatments Pt has tried steroid cream, compression     Pain Scale 0-10    Pain Score 8     Pain Type Acute pain    Pain Location Leg    Pain Orientation Right;Left    Pain Descriptors / Indicators Burning    Pain Onset With Activity    Multiple Pain Sites Yes   both legs   Evaluation and Treatment Procedures Explained to Patient/Family Yes    Evaluation and Treatment Procedures agreed to    Wound Properties Date First Assessed: 03/21/20 Time First Assessed: 1501 Wound Type: Other (Comment) Location: Leg Location Orientation: Right;Anterior Wound Description (Comments): The pt has 3 ant wounds; sup 3x.8; mid 1x.8; inferior 1.84most superior   Dressing Type Gauze (Comment)    Dressing Changed  Changed    Dressing Status Old drainage   drainage from weeping LEs not from wound bed   Dressing Change Frequency PRN    Site / Wound Assessment Painful    % Wound base Red or Granulating 0%    % Wound base Yellow/Fibrinous Exudate 100%    Peri-wound Assessment Edema;Induration;Erythema (blanchable)    Margins Epibole (rolled edges)    Drainage Amount None    Treatment Cleansed;Debridement (Selective)    Wound Properties Date First Assessed: 03/21/20 Time First Assessed: 1525 Wound Type: Other (Comment) Location: Leg Location Orientation: Left Wound Description (Comments): 5 anterior sup to inferior; .8x.8; .8x.5;1.2x1.1,.7x.7,.3x.3 below is the largest  Present on Admission: Yes   Dressing Type Gauze (Comment)    Dressing Changed Changed    Dressing Status Old drainage   drainage from weeping LEs not from wound bed    Site / Wound Assessment Painful;Yellow    % Wound base Red or Granulating 0%    % Wound base Yellow/Fibrinous Exudate 100%    Peri-wound Assessment Erythema (blanchable);Induration;Edema    Drainage Amount None    Treatment Cleansed;Debridement (Selective)    Wound Properties Date First Assessed: 03/21/20 Time First Assessed: 1535 Wound Type: Other (Comment) Location: Leg Location Orientation: Lateral;Left Wound Description (Comments): lateral aspect of LE    Dressing Type Gauze (Comment)    Dressing Changed Changed    Dressing Change Frequency PRN    Site / Wound Assessment Yellow;Painful    % Wound base Red or Granulating 0%    % Wound base Yellow/Fibrinous Exudate 100%    % Wound base Black/Eschar 0%    Peri-wound Assessment Erythema (blanchable);Induration    Drainage Amount None    Treatment Cleansed;Debridement (Selective)    Selective Debridement - Location wound bed to remove yellow slough     Selective Debridement - Tools Used Forceps;Scalpel;Scissors    Selective Debridement - Tissue Removed slough     Wound Therapy - Clinical Statement Patient needed to change bandage between sessions due to excessive weeping throughout bilateral LEs. Patients wounds with no drainage today from wound beds but with excessive weeping during session. Patient's wound beds remain covered by slough and wounds very painful. Patient tolerates sharp debridement fairly well and wound bed remains mostly slough following with no granulation tissue visable. Alginate dressing placed over weeping around wounds and in the proximal tibial region due to excessive drainage. Wound beds covered in medihoney and 4x4, cotton wrap at ankle, and kerlix.     Wound Therapy - Functional Problem List diffiiculty with walking, unable to sleep      Factors Delaying/Impairing Wound Healing Vascular compromise    Hydrotherapy Plan Debridement;Dressing change;Patient/family education    Wound Therapy - Frequency 2X / week   8 weeks    Wound Therapy - Current Recommendations PT;Other (comment)   ABI, medication for pain   Dressing  alginate, medihoney, 4x4 f.b kerlix and netting     Manual Therapy gentle decongestive techniques to decrease swelling                       PT Short Term Goals - 03/29/20 1146      PT SHORT TERM GOAL #1   Title PT to have only one wound on hre Rt LE and 3 wounds on her LT to decrease risk of infection.    Time 4    Period Weeks    Status On-going    Target Date 04/18/20  PT SHORT TERM GOAL #2   Title PT pain to have decreased to no greater than a 6/10 to allow pt to be able to sleep at least 3 hours at a time.    Time 4    Period Weeks    Status On-going             PT Long Term Goals - 03/29/20 1146      PT LONG TERM GOAL #1   Title All wounds to be healed on both LE    Time 8    Period Weeks    Status On-going      PT LONG TERM GOAL #2   Title PT pain to be no greater than a 4/10 to allow pt to obtrain 5 hrs of sleep at one time.    Time 8    Period Weeks    Status On-going      PT LONG TERM GOAL #3   Title If ABI is normal pt to be wearing compression garment of 20-30; if slightly abnormal 15-20 .    Time 8    Period Weeks    Status On-going                 Plan - 04/04/20 1604    Clinical Impression Statement see above    Personal Factors and Comorbidities Age    Examination-Activity Limitations Dressing;Locomotion Level    Examination-Participation Restrictions Cleaning    Stability/Clinical Decision Making Evolving/Moderate complexity    Rehab Potential Good    PT Frequency 2x / week    PT Duration 8 weeks    PT Treatment/Interventions ADLs/Self Care Home Management;Patient/family education;Manual techniques;Other (comment)    PT Next Visit Plan manual for edema,debridement and compression dressing    PT Home Exercise Plan ankle pumps    Consulted and Agree with Plan of Care Patient           Patient will benefit from  skilled therapeutic intervention in order to improve the following deficits and impairments:  Pain, Decreased skin integrity, Increased edema  Visit Diagnosis: Pain in right lower leg  Multiple open wounds of right lower leg  Multiple open wounds of left lower leg  Pain in left leg     Problem List Patient Active Problem List   Diagnosis Date Noted  . History of colon cancer 12/24/2017  . Colon cancer ([place]) 09/11/2013  . Small bowel obstruction ([place]) 08/25/2013  . Tachycardia 08/25/2013  . Lesion of colon 08/25/2013  . Anemia 08/25/2013  . SBO (small bowel obstruction) ([place]) 08/25/2013    4:27 PM, 04/04/20 Mearl Latin PT, DPT Physical Therapist at [facility] [place], [place], [address] Phone: [phone]   Fax:  [phone]  Name: [name] [name] MRN: [id number] Date of Birth: Oct 06, 1931

## 2020-04-06 ENCOUNTER — Other Ambulatory Visit: Payer: Self-pay

## 2020-04-06 ENCOUNTER — Ambulatory Visit (HOSPITAL_COMMUNITY): Payer: Medicare Other | Admitting: Physical Therapy

## 2020-04-06 ENCOUNTER — Encounter (HOSPITAL_COMMUNITY): Payer: Self-pay | Admitting: Physical Therapy

## 2020-04-06 DIAGNOSIS — M79605 Pain in left leg: Secondary | ICD-10-CM | POA: Diagnosis not present

## 2020-04-06 DIAGNOSIS — S81801A Unspecified open wound, right lower leg, initial encounter: Secondary | ICD-10-CM

## 2020-04-06 DIAGNOSIS — M79661 Pain in right lower leg: Secondary | ICD-10-CM | POA: Diagnosis not present

## 2020-04-06 DIAGNOSIS — S81802A Unspecified open wound, left lower leg, initial encounter: Secondary | ICD-10-CM

## 2020-04-06 NOTE — Therapy (Signed)
[facility] [place], [place], [address] Phone: [phone]   Fax:  [phone]  Wound Care Therapy  Patient Details  Name: [name] [name] MRN: [id number] Date of Birth: 1932/03/11 Referring Provider (PT): Asencion Noble   Encounter Date: 04/06/2020   PT End of Session - 04/06/20 1703    Visit Number 6    Number of Visits 16    Date for PT Re-Evaluation 05/20/20    Authorization Type medicare    Progress Note Due on Visit 10    PT Start Time 6770    PT Stop Time 1610    PT Time Calculation (min) 40 min    Activity Tolerance Patient limited by pain    Behavior During Therapy [facility] for tasks assessed/performed           Past Medical History:  Diagnosis Date  . Arthritis    hands  . Carotid artery occlusion   . Colon cancer ([place])   . Headache(784.0)    occassional headaches  . Hypertension   . Malignant neoplasm of colon ([place])   . Medical history non-contributory     Past Surgical History:  Procedure Laterality Date  . ABDOMINAL HYSTERECTOMY    . APPENDECTOMY    . COLONOSCOPY N/A 08/26/2013   Procedure: COLONOSCOPY;  Surgeon: Rogene Houston, MD;  [facility];  Service: Endoscopy;  Laterality: N/A;  . COLONOSCOPY N/A 11/24/2014   Procedure: COLONOSCOPY;  Surgeon: Rogene Houston, MD;  [facility];  Service: Endoscopy;  Laterality: N/A;  1200  . COLONOSCOPY N/A 03/19/2018   Procedure: COLONOSCOPY;  Surgeon: Rogene Houston, MD;  [facility];  Service: Endoscopy;  Laterality: N/A;  830  . FRACTURE SURGERY     wrist  . INTRAOCULAR LENS IMPLANT, SECONDARY Bilateral 2007   bilateral  . PARTIAL COLECTOMY N/A 08/27/2013   Procedure: RIGHT HEMICOLECTOMY;  Surgeon: Jamesetta So, MD;  [facility]: [facility];  Service: General;  Laterality: N/A;  . POLYPECTOMY  03/19/2018   Procedure: POLYPECTOMY;  Surgeon: Rogene Houston, MD;  [facility];  Service: Endoscopy;;  colon  .  TONSILLECTOMY      There were no vitals filed for this visit.               Wound Therapy - 04/06/20 0001    Subjective Pt states that she had a lot of drainage, her socks and pants got wet     Patient and Family Stated Goals To sleep better, have less pain and wounds to heal.     Date of Onset 11/07/19   approximate   Prior Treatments Pt has tried steroid cream, compression     Pain Scale 0-10    Pain Score 5     Pain Type Acute pain    Pain Location Leg    Pain Orientation Right;Left    Pain Descriptors / Indicators Burning    Pain Onset With Activity    Multiple Pain Sites Yes    Evaluation and Treatment Procedures Explained to Patient/Family Yes    Evaluation and Treatment Procedures agreed to    Wound Properties Date First Assessed: 03/21/20 Time First Assessed: 1501 Wound Type: Other (Comment) Location: Leg Location Orientation: Right;Anterior Wound Description (Comments): The pt has 3 ant wounds; sup 3x.8; mid 1x.8; inferior 1.25most superior   Dressing Type Alginate;Gauze (Comment)    Dressing Changed Changed    Dressing Status Old drainage  Dressing Change Frequency PRN    Site / Wound Assessment Painful    % Wound base Red or Granulating 0%    % Wound base Yellow/Fibrinous Exudate 100%    Peri-wound Assessment Edema;Induration    Margins Epibole (rolled edges)    Drainage Amount Scant    Treatment Cleansed;Debridement (Selective)    Wound Properties Date First Assessed: 03/21/20 Time First Assessed: 1525 Wound Type: Other (Comment) Location: Leg Location Orientation: Left Wound Description (Comments): 5 anterior sup to inferior; .8x.8; .8x.5;1.2x1.1,.7x.7,.3x.3 below is the largest  Present on Admission: Yes   Dressing Type Alginate;Gauze (Comment)    Dressing Changed Changed    Dressing Status Old drainage    Site / Wound Assessment Painful;Yellow    % Wound base Red or Granulating 0%    % Wound base Yellow/Fibrinous Exudate 100%    Peri-wound Assessment  Erythema (blanchable);Induration    Drainage Amount Scant    Treatment Cleansed;Debridement (Selective)    Wound Properties Date First Assessed: 03/21/20 Time First Assessed: 1535 Wound Type: Other (Comment) Location: Leg Location Orientation: Lateral;Left Wound Description (Comments): lateral aspect of LE    Dressing Type Alginate;Gauze (Comment)    Dressing Changed Changed    Dressing Change Frequency PRN    Site / Wound Assessment Painful;Yellow    % Wound base Red or Granulating 0%    % Wound base Yellow/Fibrinous Exudate 100%    % Wound base Black/Eschar 0%    Peri-wound Assessment Edema;Erythema (blanchable);Induration    Drainage Amount None    Treatment Cleansed;Debridement (Selective)    Selective Debridement - Location wound beds of all wounds     Selective Debridement - Tools Used Forceps;Scalpel;Scissors    Selective Debridement - Tissue Removed slough    Wound Therapy - Clinical Statement Therapist instructed pt to take dressing off if they become saturated.  PT wounds themselves are not draining bad, however, pt still has significant weeping coming from both LE.  Therapist added silver hydrofiber and well as ab pad.  Compression would be the best thing to control weeping but pt had significant increased pain with profore dressing, trial of gauze, ab pad, kerlix and coban.     Wound Therapy - Functional Problem List diffiiculty with walking, unable to sleep      Factors Delaying/Impairing Wound Healing Vascular compromise    Hydrotherapy Plan Debridement;Dressing change;Patient/family education    Wound Therapy - Frequency 2X / week   8 weeks   Wound Therapy - Current Recommendations PT;Other (comment)   ABI, medication for pain   Dressing  alginate, medihoney, 4x4 f.b kerlix and netting     Manual Therapy gentle decongestive techniques to decrease swelling                      PT Short Term Goals - 03/29/20 1146      PT SHORT TERM GOAL #1   Title PT to have  only one wound on hre Rt LE and 3 wounds on her LT to decrease risk of infection.    Time 4    Period Weeks    Status On-going    Target Date 04/18/20      PT SHORT TERM GOAL #2   Title PT pain to have decreased to no greater than a 6/10 to allow pt to be able to sleep at least 3 hours at a time.    Time 4    Period Weeks    Status On-going  PT Long Term Goals - 03/29/20 1146      PT LONG TERM GOAL #1   Title All wounds to be healed on both LE    Time 8    Period Weeks    Status On-going      PT LONG TERM GOAL #2   Title PT pain to be no greater than a 4/10 to allow pt to obtrain 5 hrs of sleep at one time.    Time 8    Period Weeks    Status On-going      PT LONG TERM GOAL #3   Title If ABI is normal pt to be wearing compression garment of 20-30; if slightly abnormal 15-20 .    Time 8    Period Weeks    Status On-going                 Plan - 04/06/20 1704    Clinical Impression Statement see above    Personal Factors and Comorbidities Age    Examination-Activity Limitations Dressing;Locomotion Level    Examination-Participation Restrictions Cleaning    Stability/Clinical Decision Making Evolving/Moderate complexity    Rehab Potential Good    PT Frequency 2x / week    PT Duration 8 weeks    PT Treatment/Interventions ADLs/Self Care Home Management;Patient/family education;Manual techniques;Other (comment)    PT Next Visit Plan manual for edema,debridement and compression dressing    PT Home Exercise Plan ankle pumps    Consulted and Agree with Plan of Care Patient           Patient will benefit from skilled therapeutic intervention in order to improve the following deficits and impairments:  Pain, Decreased skin integrity, Increased edema  Visit Diagnosis: Pain in right lower leg  Multiple open wounds of right lower leg  Multiple open wounds of left lower leg  Pain in left leg     Problem List Patient Active Problem List    Diagnosis Date Noted  . History of colon cancer 12/24/2017  . Colon cancer ([place]) 09/11/2013  . Small bowel obstruction ([place]) 08/25/2013  . Tachycardia 08/25/2013  . Lesion of colon 08/25/2013  . Anemia 08/25/2013  . SBO (small bowel obstruction) [facility]) 08/25/2013    Rayetta Humphrey, PT CLT [phone] 04/06/2020, 5:04 PM  [facility] [address] [place], [place], [address] Phone: [phone]   Fax:  [phone]  Name: [name] [name] MRN: [id number] Date of Birth: 1932/02/02

## 2020-04-11 ENCOUNTER — Other Ambulatory Visit: Payer: Self-pay

## 2020-04-11 ENCOUNTER — Ambulatory Visit (HOSPITAL_COMMUNITY): Payer: Medicare Other | Admitting: Physical Therapy

## 2020-04-11 DIAGNOSIS — M79661 Pain in right lower leg: Secondary | ICD-10-CM

## 2020-04-11 DIAGNOSIS — M79605 Pain in left leg: Secondary | ICD-10-CM

## 2020-04-11 DIAGNOSIS — S81801A Unspecified open wound, right lower leg, initial encounter: Secondary | ICD-10-CM

## 2020-04-11 DIAGNOSIS — S81802A Unspecified open wound, left lower leg, initial encounter: Secondary | ICD-10-CM | POA: Diagnosis not present

## 2020-04-11 NOTE — Therapy (Signed)
[facility], [place], [address] Phone: [phone]   Fax:  [phone]  Wound Care Therapy  Patient Details  Name: [name] [name] MRN: [id number] Date of Birth: Jan 22, 1932 Referring Provider (PT): Asencion Noble   Encounter Date: 04/11/2020   PT End of Session - 04/11/20 1534    Visit Number 7    Number of Visits 16    Date for PT Re-Evaluation 05/20/20    Authorization Type medicare    Progress Note Due on Visit 10    PT Start Time 1400    PT Stop Time 1440    PT Time Calculation (min) 40 min    Activity Tolerance Patient limited by pain    Behavior During Therapy [facility] for tasks assessed/performed           Past Medical History:  Diagnosis Date  . Arthritis    hands  . Carotid artery occlusion   . Colon cancer ([place])   . Headache(784.0)    occassional headaches  . Hypertension   . Malignant neoplasm of colon ([place])   . Medical history non-contributory     Past Surgical History:  Procedure Laterality Date  . ABDOMINAL HYSTERECTOMY    . APPENDECTOMY    . COLONOSCOPY N/A 08/26/2013   Procedure: COLONOSCOPY;  Surgeon: Rogene Houston, MD;  [facility];  Service: Endoscopy;  Laterality: N/A;  . COLONOSCOPY N/A 11/24/2014   Procedure: COLONOSCOPY;  Surgeon: Rogene Houston, MD;  Location[facility];  Service: Endoscopy;  Laterality: N/A;  1200  . COLONOSCOPY N/A 03/19/2018   Procedure: COLONOSCOPY;  Surgeon: Rogene Houston, MD;  Location: [facility];  Service: Endoscopy;  Laterality: N/A;  830  . FRACTURE SURGERY     wrist  . INTRAOCULAR LENS IMPLANT, SECONDARY Bilateral 2007   bilateral  . PARTIAL COLECTOMY N/A 08/27/2013   Procedure: RIGHT HEMICOLECTOMY;  Surgeon: Jamesetta So, MD;  Location: [facility];  Service: General;  Laterality: N/A;  . POLYPECTOMY  03/19/2018   Procedure: POLYPECTOMY;  Surgeon: Rogene Houston, MD;  Location[facility];  Service: Endoscopy;;  colon  .  TONSILLECTOMY      There were no vitals filed for this visit.               Wound Therapy - 04/11/20 0001    Subjective Pt states that she had a lot of drainage, her socks and pants got wet     Patient and Family Stated Goals To sleep better, have less pain and wounds to heal.     Date of Onset 11/07/19   approximate   Prior Treatments Pt has tried steroid cream, compression     Pain Scale 0-10    Pain Score 8     Pain Type Acute pain    Pain Location Leg    Pain Orientation Right;Left    Pain Descriptors / Indicators Burning    Pain Onset With Activity    Multiple Pain Sites Yes    Evaluation and Treatment Procedures Explained to Patient/Family Yes    Evaluation and Treatment Procedures agreed to    Wound Properties Date First Assessed: 03/21/20 Time First Assessed: 1501 Wound Type: Other (Comment) Location: Leg Location Orientation: Right;Anterior Wound Description (Comments): The pt has 3 ant wounds; sup 3x.8; mid 1x.8; inferior 1.41most superior   Dressing Type Gauze (Comment)    Dressing Changed Changed    Dressing Status Old drainage  Dressing Change Frequency PRN    Site / Wound Assessment Painful;Yellow    % Wound base Red or Granulating 100%    Peri-wound Assessment Edema;Induration    Margins Epibole (rolled edges)    Drainage Amount Scant    Treatment Cleansed;Debridement (Selective)    Wound Properties Date First Assessed: 03/21/20 Time First Assessed: 1525 Wound Type: Other (Comment) Location: Leg Location Orientation: Left Wound Description (Comments): 5 anterior sup to inferior; .8x.8; .8x.5;1.2x1.1,.7x.7,.3x.3 below is the largest  Present on Admission: Yes   Dressing Type Gauze (Comment)    Dressing Changed Changed    Dressing Status Old drainage    Site / Wound Assessment Painful;Yellow    % Wound base Red or Granulating 0%    % Wound base Yellow/Fibrinous Exudate 100%    Peri-wound Assessment Induration    Drainage Amount Scant    Treatment  Cleansed;Debridement (Selective)    Wound Properties Date First Assessed: 03/21/20 Time First Assessed: 1535 Wound Type: Other (Comment) Location: Leg Location Orientation: Lateral;Left Wound Description (Comments): lateral aspect of LE    Dressing Type Gauze (Comment)    Dressing Changed Changed    Dressing Change Frequency PRN    Site / Wound Assessment Painful;Yellow    % Wound base Red or Granulating 0%    % Wound base Yellow/Fibrinous Exudate 100%    Peri-wound Assessment Induration    Drainage Amount Scant    Treatment Cleansed;Debridement (Selective)    Selective Debridement - Location wound beds of all wounds     Selective Debridement - Tools Used Forceps;Scissors    Selective Debridement - Tissue Removed slough    Wound Therapy - Clinical Statement PT states that coban made her legs burn more.  She took the dressing off on Sunday but it was soaked.  Pt pain increased after coban application.  Legs contiue to weep but not as bad.  PT wounds continue to thin but there continues to be no granulation.  Pt has vascular appointment on Monday.     Wound Therapy - Functional Problem List diffiiculty with walking, unable to sleep      Factors Delaying/Impairing Wound Healing Vascular compromise    Hydrotherapy Plan Debridement;Dressing change;Patient/family education    Wound Therapy - Frequency 2X / week   8 weeks   Wound Therapy - Current Recommendations PT;Other (comment)   ABI, medication for pain   Dressing  silver hydrofiber to wounds themselves, alginate to LE wherer she is weeping followed by abpad, cotten and kerlix with netting to hole bandaging in place     Manual Therapy gentle decongestive techniques to decrease swelling                      PT Short Term Goals - 03/29/20 1146      PT SHORT TERM GOAL #1   Title PT to have only one wound on hre Rt LE and 3 wounds on her LT to decrease risk of infection.    Time 4    Period Weeks    Status On-going    Target Date  04/18/20      PT SHORT TERM GOAL #2   Title PT pain to have decreased to no greater than a 6/10 to allow pt to be able to sleep at least 3 hours at a time.    Time 4    Period Weeks    Status On-going             PT Long  Term Goals - 03/29/20 1146      PT LONG TERM GOAL #1   Title All wounds to be healed on both LE    Time 8    Period Weeks    Status On-going      PT LONG TERM GOAL #2   Title PT pain to be no greater than a 4/10 to allow pt to obtrain 5 hrs of sleep at one time.    Time 8    Period Weeks    Status On-going      PT LONG TERM GOAL #3   Title If ABI is normal pt to be wearing compression garment of 20-30; if slightly abnormal 15-20 .    Time 8    Period Weeks    Status On-going                 Plan - 04/11/20 1534    Clinical Impression Statement see above    Personal Factors and Comorbidities Age    Examination-Activity Limitations Dressing;Locomotion Level    Examination-Participation Restrictions Cleaning    Stability/Clinical Decision Making Evolving/Moderate complexity    Rehab Potential Good    PT Frequency 2x / week    PT Duration 8 weeks    PT Treatment/Interventions ADLs/Self Care Home Management;Patient/family education;Manual techniques;Other (comment)    PT Next Visit Plan measure wounds    PT Home Exercise Plan ankle pumps    Consulted and Agree with Plan of Care Patient           Patient will benefit from skilled therapeutic intervention in order to improve the following deficits and impairments:  Pain, Decreased skin integrity, Increased edema  Visit Diagnosis: Pain in right lower leg  Multiple open wounds of right lower leg  Multiple open wounds of left lower leg  Pain in left leg     Problem List Patient Active Problem List   Diagnosis Date Noted  . History of colon cancer 12/24/2017  . Colon cancer ([place]) 09/11/2013  . Small bowel obstruction ([place]) 08/25/2013  . Tachycardia 08/25/2013  . Lesion of colon  08/25/2013  . Anemia 08/25/2013  . SBO (small bowel obstruction) [facility]) 08/25/2013    Rayetta Humphrey, PT CLT [phone] 04/11/2020, 3:35 PM  [facility] [address] [place], [place], [address] Phone: [phone]   Fax:  [phone]  Name: [name] [name] MRN: [id number] Date of Birth: 07/02/1931

## 2020-04-17 ENCOUNTER — Encounter: Payer: Self-pay | Admitting: Vascular Surgery

## 2020-04-17 ENCOUNTER — Ambulatory Visit (INDEPENDENT_AMBULATORY_CARE_PROVIDER_SITE_OTHER): Payer: Medicare Other | Admitting: Vascular Surgery

## 2020-04-17 ENCOUNTER — Other Ambulatory Visit: Payer: Self-pay

## 2020-04-17 VITALS — BP 166/86 | HR 103 | Temp 97.9°F | Resp 14 | Ht 63.0 in | Wt 137.0 lb

## 2020-04-17 DIAGNOSIS — I83019 Varicose veins of right lower extremity with ulcer of unspecified site: Secondary | ICD-10-CM

## 2020-04-17 DIAGNOSIS — L97919 Non-pressure chronic ulcer of unspecified part of right lower leg with unspecified severity: Secondary | ICD-10-CM

## 2020-04-17 DIAGNOSIS — I6523 Occlusion and stenosis of bilateral carotid arteries: Secondary | ICD-10-CM

## 2020-04-17 DIAGNOSIS — L97929 Non-pressure chronic ulcer of unspecified part of left lower leg with unspecified severity: Secondary | ICD-10-CM | POA: Diagnosis not present

## 2020-04-17 DIAGNOSIS — I83029 Varicose veins of left lower extremity with ulcer of unspecified site: Secondary | ICD-10-CM | POA: Diagnosis not present

## 2020-04-17 MED ORDER — SILVER SULFADIAZINE 1 % EX CREA: 1.0000 "application " | TOPICAL_CREAM | Freq: Every day | CUTANEOUS | 0 refills | Status: DC

## 2020-04-17 NOTE — Progress Notes (Signed)
[facility] [place]  Patient name: [name] [name] MRN: [id number] DOB: 09-29-1931 Sex: female  REASON FOR CONSULT: Venous stasis ulceration bilaterally, rule out arterial insufficiency  HPI: [name] [name] is a 86 y.o. female, here today for evaluation of lower extremity wounds.  She is quite miserable.  She has severe burning and pain associated with the ulceration.  She reports that she has had to sleep in a recliner for 2 months due to the pain.  She is being seen at the wound care center through [facility].  She reports that they had recommended wrapping but she could not tolerate the wrap due to increased pain and remove these.  She is currently using meta honey at the wound center.  She reports this is causing a great deal of pain and burning with application and following application.  She has no history of arterial insufficiency.  Past Medical History:  Diagnosis Date  . Arthritis    hands  . Carotid artery occlusion   . Colon cancer ([place])   . Headache(784.0)    occassional headaches  . Hypertension   . Malignant neoplasm of colon ([place])   . Medical history non-contributory     Family History  Problem Relation Age of Onset  . Hypertension Mother   . Heart disease Mother   . Cancer Brother   . Colon cancer Neg Hx     SOCIAL HISTORY: Social History   Socioeconomic History  . Marital status: Married    Spouse name: Not on file  . Number of children: Not on file  . Years of education: Not on file  . Highest education level: Not on file  Occupational History  . Not on file  Tobacco Use  . Smoking status: Former Smoker    Packs/day: 0.50    Years: 15.00    Pack years: 7.50    Types: Cigarettes  . Smokeless tobacco: Never Used  Vaping Use  . Vaping Use: Never used  Substance and Sexual Activity  . Alcohol use: No  . Drug use: No  . Sexual activity: Not on file  Other Topics Concern  . Not on file  Social History Narrative  . Not  on file   Social Determinants of Health   Financial Resource Strain:   . Difficulty of Paying Living Expenses: Not on file  Food Insecurity:   . Worried About Charity fundraiser in the Last Year: Not on file  . Ran Out of Food in the Last Year: Not on file  Transportation Needs:   . Lack of Transportation (Medical): Not on file  . Lack of Transportation (Non-Medical): Not on file  Physical Activity:   . Days of Exercise per Week: Not on file  . Minutes of Exercise per Session: Not on file  Stress:   . Feeling of Stress : Not on file  Social Connections:   . Frequency of Communication with Friends and Family: Not on file  . Frequency of Social Gatherings with Friends and Family: Not on file  . Attends Religious Services: Not on file  . Active Member of Clubs or Organizations: Not on file  . Attends Archivist Meetings: Not on file  . Marital Status: Not on file  Intimate Partner Violence:   . Fear of Current or Ex-Partner: Not on file  . Emotionally Abused: Not on file  . Physically Abused: Not on file  . Sexually Abused: Not on file  No Known Allergies  Current Outpatient Medications  Medication Sig Dispense Refill  . acetaminophen (TYLENOL) 500 MG tablet Take 1,000 mg by mouth daily as needed for moderate pain or headache.    Marland Kitchen aspirin EC 81 MG tablet Take 1 tablet (81 mg total) by mouth daily.    Marland Kitchen atorvastatin (LIPITOR) 20 MG tablet Take 20 mg by mouth daily.    Marland Kitchen docusate sodium (COLACE) 100 MG capsule Take 200 mg by mouth as needed for mild constipation.     . gabapentin (NEURONTIN) 100 MG capsule Take by mouth.    . losartan (COZAAR) 100 MG tablet Take 100 mg by mouth at bedtime.    . metroNIDAZOLE (METROGEL) 0.75 % gel Apply 1 application topically daily as needed (rosacea).    . Polyvinyl Alcohol-Povidone (REFRESH OP) Place 1 drop into both eyes daily as needed (dry eyes).    . Wheat Dextrin (BENEFIBER DRINK MIX) PACK Take 4 g by mouth at bedtime.    .  silver sulfADIAZINE (SILVADENE) 1 % cream Apply 1 application topically daily. 50 g 0   No current facility-administered medications for this visit.    REVIEW OF SYSTEMS:  [X]  denotes positive finding, [ ]  denotes negative finding Cardiac  Comments:  Chest pain or chest pressure:    Shortness of breath upon exertion:    Short of breath when lying flat:    Irregular heart rhythm:        Vascular    Pain in calf, thigh, or hip brought on by ambulation: x   Pain in feet at night that wakes you up from your sleep:  x   Blood clot in your veins:    Leg swelling:  x       Pulmonary    Oxygen at home:    Productive cough:     Wheezing:         Neurologic    Sudden weakness in arms or legs:     Sudden numbness in arms or legs:     Sudden onset of difficulty speaking or slurred speech:    Temporary loss of vision in one eye:     Problems with dizziness:         Gastrointestinal    Blood in stool:     Vomited blood:         Genitourinary    Burning when urinating:     Blood in urine:        Psychiatric    Major depression:         Hematologic    Bleeding problems:    Problems with blood clotting too easily:        Skin    Rashes or ulcers:        Constitutional    Fever or chills:      PHYSICAL EXAM: Vitals:   04/17/20 1429  BP: (!) 166/86  Pulse: (!) 103  Resp: 14  Temp: 97.9 F (36.6 C)  TempSrc: Other (Comment)  SpO2: 95%  Weight: 137 lb (62.1 kg)  Height: 5\' 3"  (1.6 m)    GENERAL: The patient is a well-nourished female, in no acute distress. The vital signs are documented above. VASCULAR: She has 2+ radial and 2+ dorsalis pedis pulses bilaterally. PULMONARY: There is good air exchange ABDOMEN: Soft and non-tender  MUSCULOSKELETAL: There are no major deformities or cyanosis. NEUROLOGIC: No focal weakness or paresthesias are detected. SKIN: She has pitting edema from her knees distally bilaterally.  She  has extensive superficial excoriation with serous  fluid weeping diffusely out of both legs.  She also has multiple venous ulcers over both legs with some debris in the base of these. PSYCHIATRIC: The patient has a normal affect.  DATA:   Noninvasive studies from arterial flow from 03/28/2020 revealed normal ankle arm index bilaterally with biphasic flow at the dorsalis pedis and posterior tibial bilaterally  MEDICAL ISSUES:  I reassured the patient regarding her normal arterial physical exam and noninvasive studies.  I did explain the critical importance of elevation with her legs higher than her heart is much as possible and also the critical need of compression.  I explained that the fluid is causing her to have nonhealing of these ulcerations.  We have applied Silvadene to her legs today from her knees distally.  I have called in a prescription of this to her pharmacy in [place].  She does have an appointment with the wound center tomorrow.  I left a voicemail with the wound center suggesting switching to Silvadene and also that she is not willing to try Ace wrap compression again.  Also discussed Ms. Augenstein with Dr. Willey Blade.  I feel that she in all likelihood would benefit from diuretic therapy as well due to her extensive fluid retention from her knees distally.  She will see [place] again as needed   Rosetta Posner, MD [facility] [place] Office phone [phone]

## 2020-04-18 ENCOUNTER — Ambulatory Visit (HOSPITAL_COMMUNITY): Payer: Medicare Other | Admitting: Physical Therapy

## 2020-04-18 DIAGNOSIS — S81802A Unspecified open wound, left lower leg, initial encounter: Secondary | ICD-10-CM | POA: Diagnosis not present

## 2020-04-18 DIAGNOSIS — M79605 Pain in left leg: Secondary | ICD-10-CM | POA: Diagnosis not present

## 2020-04-18 DIAGNOSIS — M79661 Pain in right lower leg: Secondary | ICD-10-CM

## 2020-04-18 DIAGNOSIS — S81801A Unspecified open wound, right lower leg, initial encounter: Secondary | ICD-10-CM | POA: Diagnosis not present

## 2020-04-18 NOTE — Therapy (Signed)
[place], [place], [address] Phone: [phone]   Fax:  [phone]  Wound Care Therapy  Patient Details  Name: Kennesha Brewbaker MRN: [id number] Date of Birth: Jun 14, 1931 Referring Provider (PT): Asencion Noble   Encounter Date: 04/18/2020   PT End of Session - 04/18/20 1615    Visit Number 8    Number of Visits 16    Date for PT Re-Evaluation 05/20/20    Authorization Type medicare    Progress Note Due on Visit 10    PT Start Time 5638    PT Stop Time 1450    PT Time Calculation (min) 46 min    Activity Tolerance Patient limited by pain    Behavior During Therapy [facility] for tasks assessed/performed           Past Medical History:  Diagnosis Date  . Arthritis    hands  . Carotid artery occlusion   . Colon cancer ([place])   . Headache(784.0)    occassional headaches  . Hypertension   . Malignant neoplasm of colon ([place])   . Medical history non-contributory     Past Surgical History:  Procedure Laterality Date  . ABDOMINAL HYSTERECTOMY    . APPENDECTOMY    . COLONOSCOPY N/A 08/26/2013   Procedure: COLONOSCOPY;  Surgeon: Rogene Houston, MD;  [facility];  Service: Endoscopy;  Laterality: N/A;  . COLONOSCOPY N/A 11/24/2014   Procedure: COLONOSCOPY;  Surgeon: Rogene Houston, MD;  [facility];  Service: Endoscopy;  Laterality: N/A;  1200  . COLONOSCOPY N/A 03/19/2018   Procedure: COLONOSCOPY;  Surgeon: Rogene Houston, MD;  Location: [facility];  Service: Endoscopy;  Laterality: N/A;  830  . FRACTURE SURGERY     wrist  . INTRAOCULAR LENS IMPLANT, SECONDARY Bilateral 2007   bilateral  . PARTIAL COLECTOMY N/A 08/27/2013   Procedure: RIGHT HEMICOLECTOMY;  Surgeon: Jamesetta So, MD;  Location: [facility]S;  Service: General;  Laterality: N/A;  . POLYPECTOMY  03/19/2018   Procedure: POLYPECTOMY;  Surgeon: Rogene Houston, MD;  Location[facility];  Service: Endoscopy;;  colon  .  TONSILLECTOMY      There were no vitals filed for this visit.    Subjective Assessment - 04/18/20 1557    Subjective Pt states her legs hurt so bad.  STates she is willing to try the compression again.  Brought silvadene cream with her today.                     Wound Therapy - 04/18/20 1558    Subjective Pt states that she had a lot of drainage, her socks and pants got wet     Patient and Family Stated Goals To sleep better, have less pain and wounds to heal.     Date of Onset 11/07/19   approximate   Prior Treatments Pt has tried steroid cream, compression     Pain Scale 0-10    Pain Score 7     Pain Type Acute pain    Pain Location Leg    Pain Orientation Right;Left    Pain Descriptors / Indicators Burning    Pain Onset With Activity    Multiple Pain Sites Yes    Evaluation and Treatment Procedures Explained to Patient/Family Yes    Evaluation and Treatment Procedures agreed to    Wound Properties Date First Assessed: 03/21/20 Time First Assessed: 1501 Wound Type: Other (Comment)  Location: Leg Location Orientation: Right;Anterior Wound Description (Comments): 3 wounds, Lateral to medial   Dressing Type Gauze (Comment);Alginate   silvadene cream on wounds   Dressing Changed Changed    Dressing Status New drainage;Old drainage    Dressing Change Frequency PRN    Site / Wound Assessment Painful;Yellow;Pink    % Wound base Red or Granulating 10%    % Wound base Yellow/Fibrinous Exudate 90%    Peri-wound Assessment Edema;Induration    Tunneling (cm) wound measurements: 3 wounds lateral to medial:  2.8X2X0.2, 1.5X1.5X0.2, 0.8X0.6X0.2    Margins Epibole (rolled edges)    Drainage Amount Copious    Drainage Description Serous    Treatment Cleansed;Debridement (Selective)    Wound Properties Date First Assessed: 03/21/20 Time First Assessed: 1525 Wound Type: Other (Comment) Location: Leg Location Orientation: Left Wound Description (Comments): 5 anterior sup to inferior;  .8x.8; .8x.5;1.2x1.1,.7x.7,.3x.3 below is the largest  Present on Admission: Yes   Dressing Type Gauze (Comment)    Dressing Changed Changed    Dressing Status Old drainage    Site / Wound Assessment Painful;Yellow;Pink;Pale    % Wound base Red or Granulating 0%    % Wound base Yellow/Fibrinous Exudate 100%    Peri-wound Assessment Induration    Wound Length (cm) 7.6 cm   was 1.2   Wound Width (cm) 5.8 cm   was 1.1   Wound Depth (cm) 0.3 cm   was unknown   Wound Volume (cm^3) 13.22 cm^3    Wound Surface Area (cm^2) 44.08 cm^2    Tunneling (cm) all above measurements one large wound most medially with island in middle    Drainage Amount Copious    Drainage Description Serous    Treatment Cleansed;Debridement (Selective)    Wound Properties Date First Assessed: 03/21/20 Time First Assessed: 1535 Wound Type: Other (Comment) Location: Leg Location Orientation: Lateral;Left Wound Description (Comments): lateral aspect of LE    Dressing Type Gauze (Comment)    Dressing Changed Changed    Dressing Status Old drainage;New drainage    Dressing Change Frequency PRN    Site / Wound Assessment Yellow;Painful;Pale    % Wound base Red or Granulating 0%    % Wound base Yellow/Fibrinous Exudate 100%    Peri-wound Assessment Induration    Wound Length (cm) 2.6 cm   was 1.7   Wound Width (cm) 1.4 cm   was 1.5   Wound Depth (cm) 0.5 cm   was unknown   Wound Volume (cm^3) 1.82 cm^3    Wound Surface Area (cm^2) 3.64 cm^2    Drainage Amount Copious    Drainage Description Serous    Treatment Cleansed;Debridement (Selective)    Selective Debridement - Location wound beds of all wounds     Selective Debridement - Tools Used Scissors;Scalpel    Selective Debridement - Tissue Removed slough    Wound Therapy - Clinical Statement Wounds remeasured this session with noted increase in size with continued heavy drainage, limited granulation and adherent slough/eschar.   Measured Lt LE with 2 wounds and Rt LE  with 3 individual wounds moving lateral to medial aspect.   Per Dr Donnetta Hutching, trial of Silvadene and return to compression as tolerated (arteries/veins good per MD).  Cleansed LE well, debrided eschar per tolerance and dressed with Silvadene and alginate to absorb wounds.  3 ply ABD pads added as well as " foam to increase compression comfort.  Very light compression applied using the profore light kit.      Wound  Therapy - Functional Problem List diffiiculty with walking, unable to sleep      Factors Delaying/Impairing Wound Healing Vascular compromise    Hydrotherapy Plan Debridement;Dressing change;Patient/family education    Wound Therapy - Frequency 2X / week   8 weeks   Wound Therapy - Current Recommendations PT;Other (comment)   ABI, medication for pain   Dressing  silvadene on gauze to wounds, silver hydrofivber, ABD pads, kerlix, 1/2" foam and profore lite    Manual Therapy --                     PT Short Term Goals - 03/29/20 1146      PT SHORT TERM GOAL #1   Title PT to have only one wound on hre Rt LE and 3 wounds on her LT to decrease risk of infection.    Time 4    Period Weeks    Status On-going    Target Date 04/18/20      PT SHORT TERM GOAL #2   Title PT pain to have decreased to no greater than a 6/10 to allow pt to be able to sleep at least 3 hours at a time.    Time 4    Period Weeks    Status On-going             PT Long Term Goals - 03/29/20 1146      PT LONG TERM GOAL #1   Title All wounds to be healed on both LE    Time 8    Period Weeks    Status On-going      PT LONG TERM GOAL #2   Title PT pain to be no greater than a 4/10 to allow pt to obtrain 5 hrs of sleep at one time.    Time 8    Period Weeks    Status On-going      PT LONG TERM GOAL #3   Title If ABI is normal pt to be wearing compression garment of 20-30; if slightly abnormal 15-20 .    Time 8    Period Weeks    Status On-going                  Patient will  benefit from skilled therapeutic intervention in order to improve the following deficits and impairments:     Visit Diagnosis: Multiple open wounds of right lower leg  Pain in right lower leg  Multiple open wounds of left lower leg  Pain in left leg     Problem List Patient Active Problem List   Diagnosis Date Noted  . History of colon cancer 12/24/2017  . Colon cancer ([place]) 09/11/2013  . Small bowel obstruction ([place]) 08/25/2013  . Tachycardia 08/25/2013  . Lesion of colon 08/25/2013  . Anemia 08/25/2013  . SBO (small bowel obstruction) ([place]) 08/25/2013   Teena Irani, PTA/CLT [phone]  Teena Irani 04/18/2020, 4:16 PM  [facility] [address] [place], [place], [address] Phone: [phone]   Fax:  [phone]  Name: Zea Kostka MRN: [id number] Date of Birth: 08-27-1931

## 2020-04-20 ENCOUNTER — Ambulatory Visit (HOSPITAL_COMMUNITY): Payer: Medicare Other | Attending: Internal Medicine | Admitting: Physical Therapy

## 2020-04-20 ENCOUNTER — Other Ambulatory Visit: Payer: Self-pay

## 2020-04-20 DIAGNOSIS — S81802A Unspecified open wound, left lower leg, initial encounter: Secondary | ICD-10-CM

## 2020-04-20 DIAGNOSIS — M79605 Pain in left leg: Secondary | ICD-10-CM | POA: Diagnosis not present

## 2020-04-20 DIAGNOSIS — S81801A Unspecified open wound, right lower leg, initial encounter: Secondary | ICD-10-CM | POA: Diagnosis not present

## 2020-04-20 DIAGNOSIS — M79661 Pain in right lower leg: Secondary | ICD-10-CM | POA: Diagnosis not present

## 2020-04-20 NOTE — Therapy (Signed)
[place], [place], [address] Phone: [phone]   Fax:  [phone]  Wound Care Therapy  Patient Details  Name: [name] [name] MRN: [id number] Date of Birth: 09-04-1931 Referring Provider (PT): Asencion Noble   Encounter Date: 04/20/2020   PT End of Session - 04/20/20 1513    Visit Number 9    Number of Visits 16    Date for PT Re-Evaluation 05/20/20    Authorization Type medicare    Progress Note Due on Visit 10    PT Start Time 1400    PT Stop Time 1500    PT Time Calculation (min) 60 min    Activity Tolerance Patient limited by pain    Behavior During Therapy [facility] for tasks assessed/performed           Past Medical History:  Diagnosis Date  . Arthritis    hands  . Carotid artery occlusion   . Colon cancer ([place])   . Headache(784.0)    occassional headaches  . Hypertension   . Malignant neoplasm of colon ([place])   . Medical history non-contributory     Past Surgical History:  Procedure Laterality Date  . ABDOMINAL HYSTERECTOMY    . APPENDECTOMY    . COLONOSCOPY N/A 08/26/2013   Procedure: COLONOSCOPY;  Surgeon: Rogene Houston, MD;  [facility];  Service: Endoscopy;  Laterality: N/A;  . COLONOSCOPY N/A 11/24/2014   Procedure: COLONOSCOPY;  Surgeon: Rogene Houston, MD;  Location: [facility];  Service: Endoscopy;  Laterality: N/A;  1200  . COLONOSCOPY N/A 03/19/2018   Procedure: COLONOSCOPY;  Surgeon: Rogene Houston, MD;  Location: [facility];  Service: Endoscopy;  Laterality: N/A;  830  . FRACTURE SURGERY     wrist  . INTRAOCULAR LENS IMPLANT, SECONDARY Bilateral 2007   bilateral  . PARTIAL COLECTOMY N/A 08/27/2013   Procedure: RIGHT HEMICOLECTOMY;  Surgeon: Jamesetta So, MD;  Location: [facility] ORS;  Service: General;  Laterality: N/A;  . POLYPECTOMY  03/19/2018   Procedure: POLYPECTOMY;  Surgeon: Rogene Houston, MD;  Location: [facility];  Service: Endoscopy;;  colon  .  TONSILLECTOMY      There were no vitals filed for this visit.               Wound Therapy - 04/20/20 0001    Subjective Pt states that t was hard but she was able to keep her compression bandage on.      Patient and Family Stated Goals To sleep better, have less pain and wounds to heal.     Date of Onset 11/07/19    Prior Treatments Pt has tried steroid cream, compression     Pain Scale 0-10    Pain Score 8     Pain Type Acute pain    Pain Location Leg    Pain Orientation Right;Left    Pain Descriptors / Indicators Burning    Pain Onset With Activity    Multiple Pain Sites Yes    Evaluation and Treatment Procedures Explained to Patient/Family Yes    Evaluation and Treatment Procedures agreed to    Wound Properties Date First Assessed: 03/21/20 Time First Assessed: 1501 Wound Type: Other (Comment) Location: Leg Location Orientation: Right;Anterior Wound Description (Comments): 3 wounds, Lateral to medial   Dressing Type Compression wrap;Gauze (Comment);Silver hydrofiber    Dressing Changed Changed    Dressing Status New drainage;Old drainage    Dressing Change Frequency PRN  Site / Wound Assessment Painful;Yellow    % Wound base Red or Granulating 10%    % Wound base Yellow/Fibrinous Exudate 90%    Peri-wound Assessment Edema;Erythema (blanchable)    Margins Epibole (rolled edges)    Drainage Amount Moderate    Drainage Description Serous    Treatment Cleansed;Debridement (Selective)    Wound Properties Date First Assessed: 03/21/20 Time First Assessed: 1525 Wound Type: Other (Comment) Location: Leg Location Orientation: Left Wound Description (Comments): 5 anterior sup to inferior; .8x.8; .8x.5;1.2x1.1,.7x.7,.3x.3 below is the largest  Present on Admission: Yes   Dressing Type Compression wrap    Dressing Changed Changed    Dressing Status Old drainage    Site / Wound Assessment Painful;Yellow    % Wound base Red or Granulating 0%    % Wound base Yellow/Fibrinous  Exudate 100%    Peri-wound Assessment Edema;Erythema (blanchable)    Drainage Amount Moderate    Drainage Description Serous    Treatment Cleansed;Debridement (Selective)    Wound Properties Date First Assessed: 03/21/20 Time First Assessed: 1535 Wound Type: Other (Comment) Location: Leg Location Orientation: Lateral;Left Wound Description (Comments): lateral aspect of LE    Dressing Type Compression wrap    Dressing Changed Changed    Dressing Status New drainage;Old drainage    Dressing Change Frequency PRN    Site / Wound Assessment Painful;Yellow    % Wound base Red or Granulating 0%    % Wound base Yellow/Fibrinous Exudate 100%    Peri-wound Assessment Edema;Erythema (blanchable)    Drainage Amount Moderate    Drainage Description Serous    Treatment Cleansed;Debridement (Selective)    Selective Debridement - Location wound beds of what pt would tolerate     Selective Debridement - Tools Used Forceps;Scalpel;Scissors    Selective Debridement - Tissue Removed slough     Wound Therapy - Clinical Statement Pt kept compression bandaging on which resulted in decreased weeping and edema. Wounds continue to be covered with yellow slough and very painful to debridment making debridement not as effective as it might be.      Wound Therapy - Functional Problem List diffiiculty with walking, unable to sleep      Factors Delaying/Impairing Wound Healing Vascular compromise    Hydrotherapy Plan Debridement;Dressing change;Patient/family education    Wound Therapy - Frequency 2X / week   8 weeks   Wound Therapy - Current Recommendations PT;Other (comment)   ABI, medication for pain   Dressing  silvadene on 4x4 f/b alginate , ab pad and profore lite with 1/2 " foam                      PT Short Term Goals - 03/29/20 1146      PT SHORT TERM GOAL #1   Title PT to have only one wound on hre Rt LE and 3 wounds on her LT to decrease risk of infection.    Time 4    Period Weeks     Status On-going    Target Date 04/18/20      PT SHORT TERM GOAL #2   Title PT pain to have decreased to no greater than a 6/10 to allow pt to be able to sleep at least 3 hours at a time.    Time 4    Period Weeks    Status On-going             PT Long Term Goals - 03/29/20 1146  PT LONG TERM GOAL #1   Title All wounds to be healed on both LE    Time 8    Period Weeks    Status On-going      PT LONG TERM GOAL #2   Title PT pain to be no greater than a 4/10 to allow pt to obtrain 5 hrs of sleep at one time.    Time 8    Period Weeks    Status On-going      PT LONG TERM GOAL #3   Title If ABI is normal pt to be wearing compression garment of 20-30; if slightly abnormal 15-20 .    Time 8    Period Weeks    Status On-going                 Plan - 04/20/20 1513    Clinical Impression Statement as above    Personal Factors and Comorbidities Age    Examination-Activity Limitations Dressing;Locomotion Level    Examination-Participation Restrictions Cleaning    Stability/Clinical Decision Making Evolving/Moderate complexity    Rehab Potential Good    PT Frequency 2x / week    PT Duration 8 weeks    PT Treatment/Interventions ADLs/Self Care Home Management;Patient/family education;Manual techniques;Other (comment)    PT Next Visit Plan progress note to MD    PT Home Exercise Plan ankle pumps    Consulted and Agree with Plan of Care Patient           Patient will benefit from skilled therapeutic intervention in order to improve the following deficits and impairments:  Pain, Decreased skin integrity, Increased edema  Visit Diagnosis: Multiple open wounds of right lower leg  Pain in right lower leg  Multiple open wounds of left lower leg  Pain in left leg     Problem List Patient Active Problem List   Diagnosis Date Noted  . History of colon cancer 12/24/2017  . Colon cancer ([place]) 09/11/2013  . Small bowel obstruction ([place]) 08/25/2013  .  Tachycardia 08/25/2013  . Lesion of colon 08/25/2013  . Anemia 08/25/2013  . SBO (small bowel obstruction) [facility]) 08/25/2013    Rayetta Humphrey, PT CLT [phone] 04/20/2020, 3:15 PM  [facility] [address] [place], [place], [address] Phone: [phone]   Fax:  [phone]  Name: [name] [name] MRN: [id number] Date of Birth: 02-16-1932

## 2020-04-25 ENCOUNTER — Other Ambulatory Visit: Payer: Self-pay

## 2020-04-25 ENCOUNTER — Ambulatory Visit (HOSPITAL_COMMUNITY): Payer: Medicare Other | Admitting: Physical Therapy

## 2020-04-25 DIAGNOSIS — L03119 Cellulitis of unspecified part of limb: Secondary | ICD-10-CM | POA: Diagnosis not present

## 2020-04-25 DIAGNOSIS — Z20822 Contact with and (suspected) exposure to covid-19: Secondary | ICD-10-CM | POA: Diagnosis not present

## 2020-04-25 DIAGNOSIS — L97919 Non-pressure chronic ulcer of unspecified part of right lower leg with unspecified severity: Secondary | ICD-10-CM | POA: Diagnosis not present

## 2020-04-25 DIAGNOSIS — R531 Weakness: Secondary | ICD-10-CM | POA: Diagnosis not present

## 2020-04-25 DIAGNOSIS — L97929 Non-pressure chronic ulcer of unspecified part of left lower leg with unspecified severity: Secondary | ICD-10-CM | POA: Diagnosis not present

## 2020-04-25 DIAGNOSIS — I10 Essential (primary) hypertension: Secondary | ICD-10-CM | POA: Diagnosis not present

## 2020-04-25 DIAGNOSIS — R55 Syncope and collapse: Secondary | ICD-10-CM | POA: Diagnosis not present

## 2020-04-25 DIAGNOSIS — M79605 Pain in left leg: Secondary | ICD-10-CM

## 2020-04-25 DIAGNOSIS — N179 Acute kidney failure, unspecified: Secondary | ICD-10-CM | POA: Diagnosis not present

## 2020-04-25 DIAGNOSIS — L03116 Cellulitis of left lower limb: Secondary | ICD-10-CM | POA: Diagnosis not present

## 2020-04-25 DIAGNOSIS — S81802A Unspecified open wound, left lower leg, initial encounter: Secondary | ICD-10-CM

## 2020-04-25 DIAGNOSIS — L03115 Cellulitis of right lower limb: Secondary | ICD-10-CM | POA: Diagnosis not present

## 2020-04-25 DIAGNOSIS — S81801A Unspecified open wound, right lower leg, initial encounter: Secondary | ICD-10-CM

## 2020-04-25 DIAGNOSIS — M79661 Pain in right lower leg: Secondary | ICD-10-CM

## 2020-04-25 DIAGNOSIS — Z66 Do not resuscitate: Secondary | ICD-10-CM | POA: Diagnosis not present

## 2020-04-25 NOTE — Therapy (Signed)
[place], [place], [address] Phone: [phone]   Fax:  [phone]  Wound Care Therapy Progress Note Reporting Period 03/21/2020  To  04/25/2020  See note below for Objective Data and Assessment of Progress/Goals.      Patient Details  Name: [name] [name] MRN: [id number] Date of Birth: 03/30/1932 Referring Provider (PT): Asencion Noble   Encounter Date: 04/25/2020   PT End of Session - 04/25/20 1723    Visit Number 10    Number of Visits 16    Date for PT Re-Evaluation 05/20/20    Authorization Type medicare    Progress Note Due on Visit 20    PT Start Time 1408    PT Stop Time 1516    PT Time Calculation (min) 68 min    Activity Tolerance Patient limited by pain    Behavior During Therapy [facility] for tasks assessed/performed           Past Medical History:  Diagnosis Date  . Arthritis    hands  . Carotid artery occlusion   . Colon cancer ([place])   . Headache(784.0)    occassional headaches  . Hypertension   . Malignant neoplasm of colon ([place])   . Medical history non-contributory     Past Surgical History:  Procedure Laterality Date  . ABDOMINAL HYSTERECTOMY    . APPENDECTOMY    . COLONOSCOPY N/A 08/26/2013   Procedure: COLONOSCOPY;  Surgeon: Rogene Houston, MD;  [facility];  Service: Endoscopy;  Laterality: N/A;  . COLONOSCOPY N/A 11/24/2014   Procedure: COLONOSCOPY;  Surgeon: Rogene Houston, MD;  Location: [facility];  Service: Endoscopy;  Laterality: N/A;  1200  . COLONOSCOPY N/A 03/19/2018   Procedure: COLONOSCOPY;  Surgeon: Rogene Houston, MD;  [facility];  Service: Endoscopy;  Laterality: N/A;  830  . FRACTURE SURGERY     wrist  . INTRAOCULAR LENS IMPLANT, SECONDARY Bilateral 2007   bilateral  . PARTIAL COLECTOMY N/A 08/27/2013   Procedure: RIGHT HEMICOLECTOMY;  Surgeon: Jamesetta So, MD;  Location: [facility] ORS;  Service: General;  Laterality: N/A;  . POLYPECTOMY   03/19/2018   Procedure: POLYPECTOMY;  Surgeon: Rogene Houston, MD;  Location[facility];  Service: Endoscopy;;  colon  . TONSILLECTOMY      There were no vitals filed for this visit.               Wound Therapy - 04/25/20 1531    Subjective Pt states that t was hard but she was able to keep her compression bandage on.      Patient and Family Stated Goals To sleep better, have less pain and wounds to heal.     Date of Onset 11/07/19    Prior Treatments Pt has tried steroid cream, compression     Pain Scale 0-10    Pain Score 5     Pain Type Acute pain    Pain Location Leg    Pain Orientation Right;Left    Pain Descriptors / Indicators Burning    Pain Onset With Activity    Multiple Pain Sites Yes    Evaluation and Treatment Procedures Explained to Patient/Family Yes    Evaluation and Treatment Procedures agreed to    Wound Properties Date First Assessed: 03/21/20 Time First Assessed: 1501 Wound Type: Other (Comment) Location: Leg Location Orientation: Right;Anterior Wound Description (Comments): 1/5 most superior lateral wound   Dressing Type Compression  wrap    Dressing Changed Changed    Dressing Status Old drainage    Dressing Change Frequency PRN    Site / Wound Assessment Painful;Yellow;Pink    % Wound base Red or Granulating 10%  Was not present    % Wound base Yellow/Fibrinous Exudate 90%  Was 100%   Peri-wound Assessment Erythema (blanchable)    Wound Length (cm) 1.5 cm was    Wound Width (cm) 0.8 cm was    Wound Depth (cm) 0.4 cm     Wound Volume (cm^3) 0.48 cm^3    Wound Surface Area (cm^2) 1.2 cm^2    Margins Epibole (rolled edges)    Drainage Amount Minimal    Drainage Description Serous    Treatment Cleansed;Debridement (Selective)    Wound Properties Date First Assessed: 04/25/20 Time First Assessed: 1535 Wound Type: Other (Comment) Location: Leg Location Orientation: Right Wound Description (Comments): 2/5 Rt most lateral wound (in row across  anteriorly) Present on Admission: Yes   Dressing Type Silver dressings    Dressing Changed Changed    Dressing Status Old drainage    Dressing Change Frequency PRN    Site / Wound Assessment Yellow;Pink    % Wound base Red or Granulating 5%  Was 0%   % Wound base Yellow/Fibrinous Exudate 95% was 100%   Peri-wound Assessment Erythema (blanchable)    Wound Length (cm) 2.4 cm  Was 3.0   Wound Width (cm) 2 cm was .8   Wound Depth (cm) 0.2 cm was unknown   Wound Volume (cm^3) 0.96 cm^3    Wound Surface Area (cm^2) 4.8 cm^2    Margins Epibole (rolled edges)    Drainage Amount Minimal    Drainage Description Serous    Treatment Cleansed;Debridement (Selective)    Wound Properties Date First Assessed: 04/25/20 Time First Assessed: 1543 Wound Type: Other (Comment) Location: Leg Location Orientation: Right Wound Description (Comments): 3/5 Rt LE from lateral to medial (middle wound) Present on Admission: Yes   Dressing Type Compression wrap    Dressing Changed Changed    Dressing Status Old drainage    Dressing Change Frequency PRN    Site / Wound Assessment Pink;Yellow    % Wound base Red or Granulating 10%    % Wound base Yellow/Fibrinous Exudate 90%    Peri-wound Assessment Erythema (blanchable)    Wound Length (cm) 2.4 cm    Wound Width (cm) 1.5 cm    Wound Depth (cm) 0.2 cm    Wound Volume (cm^3) 0.72 cm^3    Wound Surface Area (cm^2) 3.6 cm^2    Margins Epibole (rolled edges)    Drainage Amount Minimal    Drainage Description Serous    Treatment Cleansed;Debridement (Selective)    Wound Properties Date First Assessed: 04/25/20 Time First Assessed: 1642 Wound Type: Other (Comment) Location: Leg Location Orientation: Right Wound Description (Comments): Rt LE 4/5 lateral to medial (smallest most medial one)   Dressing Type Compression wrap    Dressing Changed Changed    Dressing Status Old drainage    Dressing Change Frequency PRN    Site / Wound Assessment Painful;Pale    % Wound  base Red or Granulating 0%    % Wound base Yellow/Fibrinous Exudate 100%    Wound Length (cm) 0.7 cm    Wound Width (cm) 0.6 cm    Wound Depth (cm) 0.2 cm    Wound Volume (cm^3) 0.08 cm^3    Wound Surface Area (cm^2) 0.42 cm^2    Margins  Epibole (rolled edges)    Closure None    Drainage Amount Minimal    Drainage Description Serous    Treatment Cleansed;Debridement (Selective)    Wound Properties Date First Assessed: 04/25/20 Time First Assessed: 1645 Wound Type: Other (Comment) Location: Leg Location Orientation: Right Wound Description (Comments): Rt LE 5/5 most inferior medial one beneath row of 3 wounds Present on Admission: Yes   Dressing Type Compression wrap    Dressing Changed Changed    Dressing Status Old drainage    Dressing Change Frequency PRN    % Wound base Red or Granulating 0%    % Wound base Yellow/Fibrinous Exudate 100%    Wound Length (cm) 1 cm    Wound Width (cm) 1.5 cm    Wound Depth (cm) 0.2 cm    Wound Volume (cm^3) 0.3 cm^3    Wound Surface Area (cm^2) 1.5 cm^2    Margins Epibole (rolled edges)    Drainage Amount Minimal    Drainage Description Serous    Treatment Cleansed;Debridement (Selective)    Wound Properties Date First Assessed: 03/21/20 Time First Assessed: 1525 Wound Type: Other (Comment) Location: Leg Location Orientation: Left Wound Description (Comments): 5 anterior sup to inferior; .8x.8; .8x.5;1.2x1.1,.7x.7,.3x.3 below is the largest  Present on Admission: Yes   Dressing Type Compression wrap    Dressing Changed Changed    Dressing Status Old drainage    Site / Wound Assessment Painful;Yellow    % Wound base Red or Granulating 80%   20 was 0%    % Wound base Yellow/Fibrinous Exudate 100%    Peri-wound Assessment Erythema (blanchable)    Wound Length (cm) 7.6 cm  Has increased # of wounds    Wound Width (cm) 5.8 cm    Wound Depth (cm) 0.3 cm    Wound Volume (cm^3) 13.22 cm^3    Wound Surface Area (cm^2) 44.08 cm^2    Tunneling (cm) all  above a cluster of wounds with several islands between    Drainage Amount Minimal    Drainage Description Serous    Treatment Cleansed;Debridement (Selective)    Wound Properties Date First Assessed: 03/21/20 Time First Assessed: 1535 Wound Type: Other (Comment) Location: Leg Location Orientation: Lateral;Left Wound Description (Comments): lateral aspect of LE    Dressing Type Compression wrap    Dressing Changed Changed    Dressing Status Old drainage    Dressing Change Frequency PRN    Site / Wound Assessment Yellow    % Wound base Red or Granulating 5%    % Wound base Yellow/Fibrinous Exudate 95%    Peri-wound Assessment Erythema (blanchable)    Wound Length (cm) 2.6 cm    Wound Width (cm) 1.4 cm    Wound Depth (cm) 0.5 cm    Wound Volume (cm^3) 1.82 cm^3    Wound Surface Area (cm^2) 3.64 cm^2    Drainage Amount Minimal    Drainage Description Serous    Treatment Cleansed;Debridement (Selective)    Selective Debridement - Location wound beds of what pt would tolerate     Selective Debridement - Tools Used Forceps;Scalpel;Scissors    Selective Debridement - Tissue Removed slough     Wound Therapy - Clinical Statement Dressings remain intact but with noted drainage through bandages.  Wounds are slowly improving as well as the drainage and edema.  Both LE's contain clusters and scattered areas of necrosis with inability to measure all scattered areas.  Did break measurements down in Rt LE to include most superior lateral  wound (that is also the deepest) and the chain of 3 clustered together anteriorly.  Lt LE measured large cluster anteriorly that now contains more granulated islands and most lateral wound that is also the deepest.   Dressings including foam  and shoes were saturated today.  LE's however done little weeping while therapist was cleaning and debriding wounds, however. Pt with improved tolerance to debridement today.  Cleansed LE's well and moisturized prior to reapplying  dressings.  Used Silvadene on 2X2 to necrotic areas followed by alginate, ABD pads and profore lite system.  Did not utilize foam this session to see if LE's do well without it.  Pt did report comfort following application of compression wrap.   PT has been referred to a vascular MD who states pt condition is venous and approves of compression.  Weeping continues but has decreased.   Therapist expects improvement to continue now that pt is tolerating compression.    Wound Therapy - Functional Problem List diffiiculty with walking, unable to sleep      Factors Delaying/Impairing Wound Healing Vascular compromise    Hydrotherapy Plan Debridement;Dressing change;Patient/family education    Wound Therapy - Frequency 2X / week   8 weeks   Wound Therapy - Current Recommendations PT;Other (comment)   ABI, medication for pain   Wound Plan continue with silvadene per MD until runs out then transition to silver hydrofiber (DO NOT USE HONEY) and continue with light compression.    Dressing  moisturized LE's well with lotion and vaseline.  silvadene on 4x4 f/b alginate , ab pad and profore lite                     PT Short Term Goals - 04/25/20 1723      PT SHORT TERM GOAL #1   Title PT to have only one wound on hre Rt LE and 3 wounds on her LT to decrease risk of infection.    Time 4    Period Weeks    Status On-going    Target Date 04/18/20      PT SHORT TERM GOAL #2   Title PT pain to have decreased to no greater than a 6/10 to allow pt to be able to sleep at least 3 hours at a time.    Time 4    Period Weeks    Status On-going             PT Long Term Goals - 04/25/20 1723      PT LONG TERM GOAL #1   Title All wounds to be healed on both LE    Time 8    Period Weeks    Status On-going      PT LONG TERM GOAL #2   Title PT pain to be no greater than a 4/10 to allow pt to obtrain 5 hrs of sleep at one time.    Time 8    Period Weeks    Status On-going      PT LONG TERM  GOAL #3   Title If ABI is normal pt to be wearing compression garment of 20-30; if slightly abnormal 15-20 .    Time 8    Period Weeks    Status On-going                  Patient will benefit from skilled therapeutic intervention in order to improve the following deficits and impairments:     Visit Diagnosis: Pain in  right lower leg  Multiple open wounds of right lower leg  Multiple open wounds of left lower leg  Pain in left leg     Problem List Patient Active Problem List   Diagnosis Date Noted  . History of colon cancer 12/24/2017  . Colon cancer ([place]) 09/11/2013  . Small bowel obstruction ([place]) 08/25/2013  . Tachycardia 08/25/2013  . Lesion of colon 08/25/2013  . Anemia 08/25/2013  . SBO (small bowel obstruction) ([place]) 08/25/2013   Teena Irani, PTA/CLT [place], PT CLT [phone] 04/25/2020, 5:24 PM  [facility] [address] [place], [place], [address] Phone: [phone]   Fax:  [phone]  Name: [name] [name] MRN: [id number] Date of Birth: 07/27/1931

## 2020-04-25 NOTE — Patient Instructions (Signed)
Knee Roll    Lying on back, with knees bent and feet flat on floor, arms outstretched to sides, slowly roll both knees to side, hold 5 seconds. Back to starting position, hold 5 seconds. Then to opposite side, hold 5 seconds. Return to starting position. Keep shoulders and arms in contact with floor.   Piriformis Stretch - Supine    Pull uninvolved knee across body toward opposite shoulder. Hold slight stretch for _20__ seconds. Repeat with involved leg. Repeat _3__ times. Do _2__ times per day.  Knee-to-Chest Stretch: Unilateral    With hand behind right knee, pull knee in to chest until a comfortable stretch is felt in lower back and buttocks. Keep back relaxed. Hold _20___ seconds.Repeat _3___ times per set.

## 2020-04-27 ENCOUNTER — Inpatient Hospital Stay (HOSPITAL_COMMUNITY)
Admission: EM | Admit: 2020-04-27 | Discharge: 2020-05-06 | DRG: 683 | Disposition: A | Discharge status: Home / Self Care | Payer: Medicare Other | Attending: Family Medicine | Admitting: Family Medicine

## 2020-04-27 ENCOUNTER — Emergency Department (HOSPITAL_COMMUNITY): Payer: Medicare Other

## 2020-04-27 ENCOUNTER — Encounter (HOSPITAL_COMMUNITY): Payer: Self-pay | Admitting: Emergency Medicine

## 2020-04-27 ENCOUNTER — Other Ambulatory Visit: Payer: Self-pay

## 2020-04-27 ENCOUNTER — Telehealth (HOSPITAL_COMMUNITY): Payer: Self-pay

## 2020-04-27 ENCOUNTER — Ambulatory Visit (HOSPITAL_COMMUNITY): Payer: Medicare Other

## 2020-04-27 DIAGNOSIS — M7989 Other specified soft tissue disorders: Secondary | ICD-10-CM | POA: Diagnosis not present

## 2020-04-27 DIAGNOSIS — I83019 Varicose veins of right lower extremity with ulcer of unspecified site: Secondary | ICD-10-CM | POA: Diagnosis present

## 2020-04-27 DIAGNOSIS — S0990XA Unspecified injury of head, initial encounter: Secondary | ICD-10-CM | POA: Diagnosis not present

## 2020-04-27 DIAGNOSIS — N1330 Unspecified hydronephrosis: Secondary | ICD-10-CM | POA: Diagnosis present

## 2020-04-27 DIAGNOSIS — R55 Syncope and collapse: Secondary | ICD-10-CM

## 2020-04-27 DIAGNOSIS — Z8249 Family history of ischemic heart disease and other diseases of the circulatory system: Secondary | ICD-10-CM | POA: Diagnosis not present

## 2020-04-27 DIAGNOSIS — L03119 Cellulitis of unspecified part of limb: Secondary | ICD-10-CM | POA: Diagnosis present

## 2020-04-27 DIAGNOSIS — I83029 Varicose veins of left lower extremity with ulcer of unspecified site: Secondary | ICD-10-CM | POA: Diagnosis present

## 2020-04-27 DIAGNOSIS — I878 Other specified disorders of veins: Secondary | ICD-10-CM | POA: Diagnosis present

## 2020-04-27 DIAGNOSIS — Z85038 Personal history of other malignant neoplasm of large intestine: Secondary | ICD-10-CM

## 2020-04-27 DIAGNOSIS — L97929 Non-pressure chronic ulcer of unspecified part of left lower leg with unspecified severity: Secondary | ICD-10-CM | POA: Diagnosis present

## 2020-04-27 DIAGNOSIS — E440 Moderate protein-calorie malnutrition: Secondary | ICD-10-CM | POA: Diagnosis present

## 2020-04-27 DIAGNOSIS — E872 Acidosis: Secondary | ICD-10-CM | POA: Diagnosis not present

## 2020-04-27 DIAGNOSIS — Z66 Do not resuscitate: Secondary | ICD-10-CM | POA: Diagnosis present

## 2020-04-27 DIAGNOSIS — F419 Anxiety disorder, unspecified: Secondary | ICD-10-CM | POA: Diagnosis present

## 2020-04-27 DIAGNOSIS — E876 Hypokalemia: Secondary | ICD-10-CM | POA: Diagnosis not present

## 2020-04-27 DIAGNOSIS — Z90710 Acquired absence of both cervix and uterus: Secondary | ICD-10-CM | POA: Diagnosis not present

## 2020-04-27 DIAGNOSIS — L03116 Cellulitis of left lower limb: Secondary | ICD-10-CM | POA: Diagnosis not present

## 2020-04-27 DIAGNOSIS — Z9049 Acquired absence of other specified parts of digestive tract: Secondary | ICD-10-CM | POA: Diagnosis not present

## 2020-04-27 DIAGNOSIS — I6523 Occlusion and stenosis of bilateral carotid arteries: Secondary | ICD-10-CM | POA: Diagnosis present

## 2020-04-27 DIAGNOSIS — Z87891 Personal history of nicotine dependence: Secondary | ICD-10-CM | POA: Diagnosis not present

## 2020-04-27 DIAGNOSIS — L0390 Cellulitis, unspecified: Secondary | ICD-10-CM | POA: Diagnosis present

## 2020-04-27 DIAGNOSIS — Z79899 Other long term (current) drug therapy: Secondary | ICD-10-CM

## 2020-04-27 DIAGNOSIS — Z20822 Contact with and (suspected) exposure to covid-19: Secondary | ICD-10-CM | POA: Diagnosis present

## 2020-04-27 DIAGNOSIS — I951 Orthostatic hypotension: Secondary | ICD-10-CM | POA: Diagnosis present

## 2020-04-27 DIAGNOSIS — I471 Supraventricular tachycardia: Secondary | ICD-10-CM | POA: Diagnosis present

## 2020-04-27 DIAGNOSIS — I361 Nonrheumatic tricuspid (valve) insufficiency: Secondary | ICD-10-CM | POA: Diagnosis not present

## 2020-04-27 DIAGNOSIS — R0902 Hypoxemia: Secondary | ICD-10-CM | POA: Diagnosis present

## 2020-04-27 DIAGNOSIS — R600 Localized edema: Secondary | ICD-10-CM | POA: Diagnosis present

## 2020-04-27 DIAGNOSIS — W07XXXA Fall from chair, initial encounter: Secondary | ICD-10-CM | POA: Diagnosis present

## 2020-04-27 DIAGNOSIS — E860 Dehydration: Secondary | ICD-10-CM | POA: Diagnosis present

## 2020-04-27 DIAGNOSIS — L97919 Non-pressure chronic ulcer of unspecified part of right lower leg with unspecified severity: Secondary | ICD-10-CM | POA: Diagnosis present

## 2020-04-27 DIAGNOSIS — I4891 Unspecified atrial fibrillation: Secondary | ICD-10-CM | POA: Diagnosis not present

## 2020-04-27 DIAGNOSIS — I872 Venous insufficiency (chronic) (peripheral): Secondary | ICD-10-CM | POA: Diagnosis present

## 2020-04-27 DIAGNOSIS — Z809 Family history of malignant neoplasm, unspecified: Secondary | ICD-10-CM | POA: Diagnosis not present

## 2020-04-27 DIAGNOSIS — D649 Anemia, unspecified: Secondary | ICD-10-CM | POA: Diagnosis present

## 2020-04-27 DIAGNOSIS — I10 Essential (primary) hypertension: Secondary | ICD-10-CM | POA: Diagnosis present

## 2020-04-27 DIAGNOSIS — R531 Weakness: Secondary | ICD-10-CM | POA: Diagnosis not present

## 2020-04-27 DIAGNOSIS — I480 Paroxysmal atrial fibrillation: Secondary | ICD-10-CM | POA: Diagnosis present

## 2020-04-27 DIAGNOSIS — N179 Acute kidney failure, unspecified: Principal | ICD-10-CM | POA: Diagnosis present

## 2020-04-27 DIAGNOSIS — J90 Pleural effusion, not elsewhere classified: Secondary | ICD-10-CM | POA: Diagnosis not present

## 2020-04-27 DIAGNOSIS — Z7982 Long term (current) use of aspirin: Secondary | ICD-10-CM

## 2020-04-27 DIAGNOSIS — L03115 Cellulitis of right lower limb: Secondary | ICD-10-CM | POA: Diagnosis not present

## 2020-04-27 DIAGNOSIS — R269 Unspecified abnormalities of gait and mobility: Secondary | ICD-10-CM | POA: Diagnosis present

## 2020-04-27 DIAGNOSIS — J9811 Atelectasis: Secondary | ICD-10-CM | POA: Diagnosis not present

## 2020-04-27 DIAGNOSIS — H9190 Unspecified hearing loss, unspecified ear: Secondary | ICD-10-CM | POA: Diagnosis present

## 2020-04-27 DIAGNOSIS — I517 Cardiomegaly: Secondary | ICD-10-CM | POA: Diagnosis not present

## 2020-04-27 LAB — TROPONIN I (HIGH SENSITIVITY): Troponin I (High Sensitivity): 13 ng/L (ref <18)

## 2020-04-27 LAB — CBC
HCT: 33.6 % — ABNORMAL LOW (ref 36.0–46.0)
Hemoglobin: 10.5 g/dL — ABNORMAL LOW (ref 12.0–15.0)
MCH: 30 pg (ref 26.0–34.0)
MCHC: 31.3 g/dL (ref 30.0–36.0)
MCV: 96 fL (ref 80.0–100.0)
Platelets: 364 10*3/uL (ref 150–400)
RBC: 3.5 MIL/uL — ABNORMAL LOW (ref 3.87–5.11)
RDW: 17.2 % — ABNORMAL HIGH (ref 11.5–15.5)
WBC: 17.6 10*3/uL — ABNORMAL HIGH (ref 4.0–10.5)
nRBC: 0.2 % (ref 0.0–0.2)

## 2020-04-27 LAB — HEPATIC FUNCTION PANEL
ALT: 16 U/L (ref 0–44)
AST: 21 U/L (ref 15–41)
Albumin: 2.2 g/dL — ABNORMAL LOW (ref 3.5–5.0)
Alkaline Phosphatase: 75 U/L (ref 38–126)
Bilirubin, Direct: <0.1 mg/dL (ref 0.0–0.2)
Total Bilirubin: 0.5 mg/dL (ref 0.3–1.2)
Total Protein: 6.8 g/dL (ref 6.5–8.1)

## 2020-04-27 LAB — RESP PANEL BY RT-PCR (FLU A&B, COVID) ARPGX2
Influenza A by PCR: NEGATIVE
Influenza B by PCR: NEGATIVE
SARS Coronavirus 2 by RT PCR: NEGATIVE

## 2020-04-27 LAB — CBG MONITORING, ED
Glucose-Capillary: 66 mg/dL — ABNORMAL LOW (ref 70–99)
Glucose-Capillary: 91 mg/dL (ref 70–99)

## 2020-04-27 LAB — BASIC METABOLIC PANEL
Anion gap: 14 (ref 5–15)
BUN: 90 mg/dL — ABNORMAL HIGH (ref 8–23)
CO2: 17 mmol/L — ABNORMAL LOW (ref 22–32)
Calcium: 7.8 mg/dL — ABNORMAL LOW (ref 8.9–10.3)
Chloride: 104 mmol/L (ref 98–111)
Creatinine, Ser: 3.12 mg/dL — ABNORMAL HIGH (ref 0.44–1.00)
GFR, Estimated: 14 mL/min — ABNORMAL LOW (ref >60)
Glucose, Bld: 114 mg/dL — ABNORMAL HIGH (ref 70–99)
Potassium: 4.2 mmol/L (ref 3.5–5.1)
Sodium: 135 mmol/L (ref 135–145)

## 2020-04-27 MED ORDER — VANCOMYCIN HCL 1250 MG/250ML IV SOLN
1250.0000 mg | Freq: Once | INTRAVENOUS | Status: AC
  Administered 2020-04-27: 1250 mg via INTRAVENOUS
  Filled 2020-04-27: qty 250

## 2020-04-27 MED ORDER — HYDROMORPHONE HCL 1 MG/ML IJ SOLN
0.5000 mg | Freq: Once | INTRAMUSCULAR | Status: AC
  Administered 2020-04-27: 0.5 mg via INTRAVENOUS
  Filled 2020-04-27: qty 1

## 2020-04-27 MED ORDER — LACTATED RINGERS IV BOLUS
500.0000 mL | Freq: Once | INTRAVENOUS | Status: AC
  Administered 2020-04-27: 500 mL via INTRAVENOUS

## 2020-04-27 MED ORDER — VANCOMYCIN HCL 750 MG/150ML IV SOLN
750.0000 mg | INTRAVENOUS | Status: DC
  Administered 2020-04-29 – 2020-05-03 (×3): 750 mg via INTRAVENOUS
  Filled 2020-04-27 (×4): qty 150

## 2020-04-27 NOTE — Progress Notes (Signed)
Pharmacy Antibiotic Note  [name] [name] is a 86 y.o. female admitted on 04/27/2020 with cellulitis.  Pharmacy has been consulted for Vancomycin dosing.   AKI, creatinine 3.12.    Venous statis ulcers, noted with erythema.  Plan:  Vancomycin 1250 mg IV x 1, then Vancomycin 750 mg IV q48h.  Target Vanc troughs 10-15 mcg/ml  Will follow renal function and adjust regimen as indicated.  Follow clinical progress and antibiotic plans.  Height: 5\' 3"  (160 cm) Weight: 61.2 kg (135 lb) IBW/kg (Calculated) : 52.4  Temp (24hrs), Avg:97.6 F (36.4 C), Min:97.5 F (36.4 C), Max:97.7 F (36.5 C)  Recent Labs  Lab 04/27/20 1517  WBC 17.6*  CREATININE 3.12*    Estimated Creatinine Clearance: 10.7 mL/min (A) (by C-G formula based on SCr of 3.12 mg/dL (H)).    No Known Allergies  Antimicrobials this admission:  Vancomycin 12/9 >>  Dose adjustments this admission:  n/a  Microbiology results:  12/9 COVID and flu: negative  Thank you for allowing pharmacy to be a part of this patient's care.  Arty Baumgartner, [place] Phone: [phone] 04/27/2020 9:10 PM

## 2020-04-27 NOTE — ED Triage Notes (Signed)
Pt c/o loc. Pt husband states pt was in the kitchen and she passed out. Pt denies hitting her head.

## 2020-04-27 NOTE — Telephone Encounter (Signed)
Pt's husband called to cancel wound care session today due to her passing out regarding proper care at home.  He reports BLE are wheaping through dressings and shoes.  Pt instructed to remove profore if gets wet, cleanse LE and reapply dressings.  Strongly encouraged to seek medical assistance if continues to feel bad or unable to replace dressings.    Ihor Austin, LPTA/CLT; Delana Meyer [phone]

## 2020-04-27 NOTE — ED Provider Notes (Signed)
[facility] EMERGENCY DEPARTMENT Provider Note   CSN: [id number] Arrival date & time: 04/27/20  1442     History Chief Complaint  Patient presents with  . Loss of Consciousness    [name] [name] is a 86 y.o. female.  HPI Patient brought in after syncopal episode.  Reportedly was in the kitchen and felt very weak and lightheaded and passed out.  Reportedly no injury the fall.  States she had decreased oral intake.  States she is just not had an appetite for the last few days.  Has chronic edema and chronic wounds on her leg lower legs.  Currently has dressings on.  Reviewing notes it appears a week and a half ago she saw vascular surgery and after discussion with primary care she was started on diuretics.  No chest pain.  No blood in the stool.    Past Medical History:  Diagnosis Date  . Arthritis    hands  . Carotid artery occlusion   . Colon cancer ([place])   . Headache(784.0)    occassional headaches  . Hypertension   . Malignant neoplasm of colon ([place])   . Medical history non-contributory     Patient Active Problem List   Diagnosis Date Noted  . History of colon cancer 12/24/2017  . Colon cancer ([place]) 09/11/2013  . Small bowel obstruction ([place]) 08/25/2013  . Tachycardia 08/25/2013  . Lesion of colon 08/25/2013  . Anemia 08/25/2013  . SBO (small bowel obstruction) ([place]) 08/25/2013    Past Surgical History:  Procedure Laterality Date  . ABDOMINAL HYSTERECTOMY    . APPENDECTOMY    . COLONOSCOPY N/A 08/26/2013   Procedure: COLONOSCOPY;  Surgeon: Rogene Houston, MD;  Location: [facility] ENDO SUITE;  Service: Endoscopy;  Laterality: N/A;  . COLONOSCOPY N/A 11/24/2014   Procedure: COLONOSCOPY;  Surgeon: Rogene Houston, MD;  Location: [facility] ENDO [facility];  Service: Endoscopy;  Laterality: N/A;  1200  . COLONOSCOPY N/A 03/19/2018   Procedure: COLONOSCOPY;  Surgeon: Rogene Houston, MD;  Location: [facility] ENDO SUITE;  Service: Endoscopy;  Laterality: N/A;  830  . FRACTURE SURGERY     wrist  .  INTRAOCULAR LENS IMPLANT, SECONDARY Bilateral 2007   bilateral  . PARTIAL COLECTOMY N/A 08/27/2013   Procedure: RIGHT HEMICOLECTOMY;  Surgeon: Jamesetta So, MD;  Location: [facility] ORS;  Service: General;  Laterality: N/A;  . POLYPECTOMY  03/19/2018   Procedure: POLYPECTOMY;  Surgeon: Rogene Houston, MD;  Location: [facility];  Service: Endoscopy;;  colon  . TONSILLECTOMY       OB History   No obstetric history on file.     Family History  Problem Relation Age of Onset  . Hypertension Mother   . Heart disease Mother   . Cancer Brother   . Colon cancer Neg Hx     Social History   Tobacco Use  . Smoking status: Former Smoker    Packs/day: 0.50    Years: 15.00    Pack years: 7.50    Types: Cigarettes  . Smokeless tobacco: Never Used  Vaping Use  . Vaping Use: Never used  Substance Use Topics  . Alcohol use: No  . Drug use: No    Home Medications Prior to Admission medications   Medication Sig Start Date End Date Taking? Authorizing Provider  acetaminophen (TYLENOL) 500 MG tablet Take 1,000 mg by mouth daily as needed for moderate pain or headache.    [provider]  aspirin EC 81 MG tablet Take  1 tablet (81 mg total) by mouth daily. 03/20/18   Rehman, Mechele Dawley, MD  atorvastatin (LIPITOR) 20 MG tablet Take 20 mg by mouth daily.    [provider]  docusate sodium (COLACE) 100 MG capsule Take 200 mg by mouth as needed for mild constipation.     [provider]  gabapentin (NEURONTIN) 100 MG capsule Take by mouth. 03/30/20   [provider]  losartan (COZAAR) 100 MG tablet Take 100 mg by mouth at bedtime. 01/19/20   [provider]  metroNIDAZOLE (METROGEL) 0.75 % gel Apply 1 application topically daily as needed (rosacea).    [provider]  Polyvinyl Alcohol-Povidone (REFRESH OP) Place 1 drop into both eyes daily as needed (dry eyes).    [provider]  silver sulfADIAZINE (SILVADENE) 1 % cream Apply 1  application topically daily. 04/17/20   Rosetta Posner, MD  Wheat Dextrin (BENEFIBER DRINK MIX) PACK Take 4 g by mouth at bedtime. 03/19/18   Rogene Houston, MD    Allergies    Patient has no known allergies.  Review of Systems   Review of Systems  Constitutional: Positive for appetite change and fatigue.  HENT: Negative for congestion.   Respiratory: Negative for shortness of breath.   Cardiovascular: Positive for leg swelling.  Gastrointestinal: Negative for abdominal pain.  Genitourinary: Negative for flank pain.  Musculoskeletal: Negative for back pain.  Skin: Positive for wound.  Neurological: Positive for syncope and light-headedness.  Psychiatric/Behavioral: Negative for confusion.    Physical Exam Updated Vital Signs BP 98/81   Pulse (!) 106   Temp 97.6 F (36.4 C) (Axillary)   Resp 15   Ht 5\' 3"  (1.6 m)   Wt 61.2 kg   SpO2 100%   BMI 23.91 kg/m   Physical Exam Vitals and nursing note reviewed.  HENT:     Head: Normocephalic and atraumatic.  Eyes:     Pupils: Pupils are equal, round, and reactive to light.  Cardiovascular:     Rate and Rhythm: Regular rhythm.  Pulmonary:     Breath sounds: No wheezing or rhonchi.  Chest:     Chest wall: No tenderness.  Abdominal:     Comments: Mild distention with no rebound or guarding.  Musculoskeletal:        General: Swelling present.     Cervical back: Neck supple.     Comments: Patient with pressure dressings on bilateral lower extremities.  Skin:    General: Skin is warm.     Capillary Refill: Capillary refill takes less than 2 seconds.  Neurological:     Mental Status: She is alert and oriented to person, place, and time.  Psychiatric:        Mood and Affect: Mood normal.   Bilateral lower extremities if now her dressing removed.  There are multiple ulcers and surrounding erythema for the mid lower legs bilaterally.  ED Results / Procedures / Treatments   Labs (all labs ordered are listed, but only  abnormal results are displayed) Labs Reviewed  BASIC METABOLIC PANEL - Abnormal; Notable for the following components:      Result Value   CO2 17 (*)    Glucose, Bld 114 (*)    BUN 90 (*)    Creatinine, Ser 3.12 (*)    Calcium 7.8 (*)    GFR, Estimated 14 (*)    All other components within normal limits  CBC - Abnormal; Notable for the following components:   WBC 17.6 (*)  RBC 3.50 (*)    Hemoglobin 10.5 (*)    HCT 33.6 (*)    RDW 17.2 (*)    All other components within normal limits  HEPATIC FUNCTION PANEL - Abnormal; Notable for the following components:   Albumin 2.2 (*)    All other components within normal limits  CBG MONITORING, ED - Abnormal; Notable for the following components:   Glucose-Capillary 66 (*)    All other components within normal limits  RESP PANEL BY RT-PCR (FLU A&B, COVID) [id number]  URINALYSIS, ROUTINE W REFLEX MICROSCOPIC  CBG MONITORING, ED  TROPONIN I (HIGH SENSITIVITY)    EKG EKG Interpretation  Date/Time:  Thursday April 27 2020 15:17:32 EST Ventricular Rate:  93 PR Interval:  140 QRS Duration: 74 QT Interval:  380 QTC Calculation: 472 R Axis:   38 Text Interpretation: Normal sinus rhythm with sinus arrhythmia Nonspecific ST and T wave abnormality Abnormal ECG Confirmed by Davonna Belling [phone]) on 04/27/2020 4:22:53 PM   Radiology DG Chest Portable 1 View  Result Date: 04/27/2020 CLINICAL DATA:  weakness EXAM: PORTABLE CHEST 1 VIEW COMPARISON:  08/26/2013. FINDINGS: No focal consolidation. No pneumothorax or pleural effusion. Stable cardiomediastinal silhouette. Multilevel spondylosis. IMPRESSION: No focal airspace disease. Electronically Signed   By: Primitivo Gauze M.D.   On: 04/27/2020 17:33    Procedures Procedures (including critical care time)  Medications Ordered in ED Medications  HYDROmorphone (DILAUDID) injection 0.5 mg (has no administration in time range)  lactated ringers bolus 500 mL (500 mLs Intravenous New  Bag/Given 04/27/20 1729)    ED Course  I have reviewed the triage vital signs and the nursing notes.  Pertinent labs & imaging results that were available during my care of the patient were reviewed by me and considered in my medical decision making (see chart for details).    MDM Rules/Calculators/A&P                          Patient presented with a syncopal episode.  Decreased oral intake.  Initial hypotension.  Improved with some IV fluids.  I think likely secondary to new diuretic use for peripheral edema.  States her legs are less swollen than they were. Also found to have elevated white count.  I think with the erythema on both lower legs cellulitis has to be considered.  Do not think however this is a severe sepsis from infection Will admit to hospitalist.  I have reviewed lab work and imaging. Final Clinical Impression(s) / ED Diagnoses Final diagnoses:  AKI (acute kidney injury) ([place])  Cellulitis of lower extremity, unspecified laterality    Rx / DC Orders ED Discharge Orders    None       Davonna Belling, MD 04/27/20 1943

## 2020-04-27 NOTE — H&P (Signed)
TRH H&P    Patient Demographics:    [name] [name], is a 86 y.o. female  MRN: [id number]  DOB - 04-May-1932  Admit Date - 04/27/2020  Referring MD/NP/PA: Alvino Chapel  Outpatient Primary MD for the patient is Asencion Noble, MD  Patient coming from: Home  Chief complaint- Syncope   HPI:    [name] [name]  is a 86 y.o. female, of hypertension, carotid artery occlusion, hypertension, venous stasis changes of lower extremities, presents to the ED with a chief complaint of syncope. And a lot of pain and only intermittently answers questions secondary to the pain asking "please help me God," so husband at bedside provides most of the history. He reports that patient had got up from the recliner chair walk to the kitchen to make her self a cup of coffee, before she was able to get a coffee cup she felt herself falling backwards and could not stop it. This is what she told him about the event. It was unwitnessed. Patient reports that she did not have chest pain, palpitations, shortness of breath before the event. She is not sure if she hit her head. Neither of them are sure if she lost consciousness as he was in the back bedroom and did not hear her fall, only heard her calling for him after she was alert. He reports that she was immediately alert and oriented. She was not able to get up on her own, so he got her up and took her to the recliner chair. After some rest, patient was able to get up and walk around. She even did some household chores and went up and down stairs. It was time for them to go to their appointment at the independent wound care when he noted that she was not talking right, and not looking at him right. He suspected that she had had a stroke. He reports that he does not hear very well, so he cannot describe how she was not talking right. He said it was not confusion, and he does not think she was slurring her speech, but  that something was wrong with her voice. He also cannot further explain how she was not looking at him right, he just thought it was not normal. So they called Dr. Ria Comment office who recommended he come into the ER. The time of my exam he reports that patient is talking correctly, and looking at him normally.  Patient has been battling lower extremity venous stasis ulcers for 2 months. They report that she has to sleep in a recliner chair because elevating her legs causes pain that she cannot stand. Both wound care and vascular surgery outpatient have advised them to elevate her legs and wrap them. She did have her legs wrapped at wound care 1 time, and his refused wraps since because it was too painful. They are using Medihoney at wound care, but this caused burning with every application. When she saw vascular surgery outpatient they recommended changing to Silvadene this information on to the wound care center ABIs were done in  the outpatient setting that showed normal ankle/arm index bilateral with biphasic flow at dorsalis pedis and posterior tibial bilaterally. Vascular surgery had recommended patient be diuresed, as peripheral edema was slowing her healing of her ulcers. Patient was started on hydrochlorothiazide in November. She reports over the last 2 weeks she has had no appetite. She drinks less than a glass of water, and less than a glass of tea per day. She has not been eating because she does not feel hungry. Patient denies any nausea vomiting abdominal pain diarrhea. She denies any fevers, any aches and pains other than her lower extremity wounds. Patient denies any pain or burning with urination, cough or shortness of breath.  Patient is on vaccinated for Covid and reports that she will not get vaccinated. She does not smoke, does not drink alcohol, does not use illicit drugs. Patient is DNR, and husband is distressed by this CODE STATUS. Patient confirms DNR status even after her husband  expressed his concerns.  In the ED Temperature 97.5, heart rate 81-110, respiratory rate 14-17, blood pressure 82/61 -136/55 White blood cell count 17.6, hemoglobin 10.5, platelet count 364 Chemistry panel reveals a low bicarb at 17, elevated creatinine at 3.12 Chest x-ray shows no focal airspace disease Patient was given 500 LR bolus and Dilaudid Admission was requested for work-up of syncope, and cellulitis    Review of systems:    In addition to the HPI above,  No Fever-chills, No Headache, No changes with Vision or hearing, No problems swallowing food or Liquids, No Chest pain, Cough or Shortness of Breath, No Abdominal pain, No Nausea or Vomiting, bowel movements are regular, admits to decreased appetite No Blood in stool or Urine, No dysuria, Admits to venous stasis ulcers for 2 months No new joints pains-aches,  No new weakness, tingling, numbness in any extremity, husband reports nonspecific change in speech No recent weight gain or loss, No polyuria, polydypsia or polyphagia, No significant Mental Stressors.  All other systems reviewed and are negative.    Past History of the following :    Past Medical History:  Diagnosis Date  . Arthritis    hands  . Carotid artery occlusion   . Colon cancer ([place])   . Headache(784.0)    occassional headaches  . Hypertension   . Malignant neoplasm of colon ([place])   . Medical history non-contributory       Past Surgical History:  Procedure Laterality Date  . ABDOMINAL HYSTERECTOMY    . APPENDECTOMY    . COLONOSCOPY N/A 08/26/2013   Procedure: COLONOSCOPY;  Surgeon: Rogene Houston, MD;  [facility];  Service: Endoscopy;  Laterality: N/A;  . COLONOSCOPY N/A 11/24/2014   Procedure: COLONOSCOPY;  Surgeon: Rogene Houston, MD;  [facility];  Service: Endoscopy;  Laterality: N/A;  1200  . COLONOSCOPY N/A 03/19/2018   Procedure: COLONOSCOPY;  Surgeon: Rogene Houston, MD;  Location[facility];   Service: Endoscopy;  Laterality: N/A;  830  . FRACTURE SURGERY     wrist  . INTRAOCULAR LENS IMPLANT, SECONDARY Bilateral 2007   bilateral  . PARTIAL COLECTOMY N/A 08/27/2013   Procedure: RIGHT HEMICOLECTOMY;  Surgeon: Jamesetta So, MD;  Location: [facility];  Service: General;  Laterality: N/A;  . POLYPECTOMY  03/19/2018   Procedure: POLYPECTOMY;  Surgeon: Rogene Houston, MD;  [facility];  Service: Endoscopy;;  colon  . TONSILLECTOMY        Social History:  Social History   Tobacco Use  . Smoking status: Former Smoker    Packs/day: 0.50    Years: 15.00    Pack years: 7.50    Types: Cigarettes  . Smokeless tobacco: Never Used  Substance Use Topics  . Alcohol use: No       Family History :     Family History  Problem Relation Age of Onset  . Hypertension Mother   . Heart disease Mother   . Cancer Brother   . Colon cancer Neg Hx       Home Medications:   Prior to Admission medications   Medication Sig Start Date End Date Taking? Authorizing Provider  acetaminophen (TYLENOL) 500 MG tablet Take 1,000 mg by mouth daily as needed for moderate pain or headache.   Yes [provider]  aspirin EC 81 MG tablet Take 1 tablet (81 mg total) by mouth daily. 03/20/18  Yes Rehman, Mechele Dawley, MD  atorvastatin (LIPITOR) 20 MG tablet Take 20 mg by mouth daily.   Yes [provider]  docusate sodium (COLACE) 100 MG capsule Take 200 mg by mouth as needed for mild constipation.    Yes [provider]  gabapentin (NEURONTIN) 100 MG capsule Take 100 mg by mouth 2 (two) times daily. 03/30/20  Yes [provider]  hydrochlorothiazide (HYDRODIURIL) 25 MG tablet Take 25 mg by mouth daily. 04/18/20  Yes [provider]  losartan (COZAAR) 100 MG tablet Take 100 mg by mouth at bedtime. 01/19/20  Yes [provider]  Polyvinyl Alcohol-Povidone (REFRESH OP) Place 1 drop into both eyes daily as needed (dry eyes).   Yes [provider]  silver sulfADIAZINE (SILVADENE) 1 % cream Apply 1 application topically daily. 04/17/20  Yes Early, Arvilla Meres, MD  Wheat Dextrin (BENEFIBER DRINK MIX) PACK Take 4 g by mouth at bedtime. 03/19/18  Yes Rehman, Mechele Dawley, MD     Allergies:    No Known Allergies   Physical Exam:   Vitals  Blood pressure (!) 121/94, pulse (!) 113, temperature 97.6 F (36.4 C), temperature source Axillary, resp. rate 18, height 5\' 3"  (1.6 m), weight 61.2 kg, SpO2 100 %.  1.  General: Patient lying supine in bed, repositioning frequently, visibly in pain  2. Psychiatric:  Behavior normal for situation, cooperative with exam  3. Neurologic: Alert and oriented x3, moves all 4 extremities voluntarily, speech and language normal, at baseline  4. HEENMT:  Head is atraumatic, normocephalic, pupils reactive to light, neck is slightly cachectic, trachea is midline, mucous membranes are mildly dry  5. Respiratory : Lungs are clear to auscultation bilaterally without wheeze, rhonchi, rales Cyanosis present in left fingertips, left toes -patient reports this is chronic, likely Raynaud's   6. Cardiovascular : Heart rate is tachycardic at 110, rhythm is regular, no murmurs rubs or gallops, venous stasis changes in both lower extremities  7. Gastrointestinal:  Abdomen is soft, nondistended, nontender to palpation  8. Skin:  Erythema, ulcers with granulation tissue, serous drainage of both lower extremities No other rashes or acute lesions on limited exam  9.Musculoskeletal:  Lower extremities with venous stasis changes as described above, no acute deformity, no calf tenderness    Data Review:    CBC Recent Labs  Lab 04/27/20 1517  WBC 17.6*  HGB 10.5*  HCT 33.6*  PLT 364  MCV 96.0  MCH 30.0  MCHC 31.3  RDW 17.2*   ------------------------------------------------------------------------------------------------------------------  Results for orders placed or performed during the  hospital encounter of 04/27/20 (from the past 48 hour(s))  CBG monitoring, ED     Status: Abnormal   Collection Time: 04/27/20  3:12 PM  Result Value Ref Range   Glucose-Capillary 66 (L) 70 - 99 mg/dL    Comment: Glucose reference range applies only to samples taken after fasting for at least 8 hours.  Basic metabolic panel     Status: Abnormal   Collection Time: 04/27/20  3:17 PM  Result Value Ref Range   Sodium 135 135 - 145 mmol/L   Potassium 4.2 3.5 - 5.1 mmol/L   Chloride 104 98 - 111 mmol/L   CO2 17 (L) 22 - 32 mmol/L   Glucose, Bld 114 (H) 70 - 99 mg/dL    Comment: Glucose reference range applies only to samples taken after fasting for at least 8 hours.   BUN 90 (H) 8 - 23 mg/dL   Creatinine, Ser 3.12 (H) 0.44 - 1.00 mg/dL   Calcium 7.8 (L) 8.9 - 10.3 mg/dL   GFR, Estimated 14 (L) >60 mL/min    Comment: (NOTE) Calculated using the CKD-EPI Creatinine Equation (2021)    Anion gap 14 5 - 15    Comment: Performed at [facility], [address], [place], [place] [address]  CBC     Status: Abnormal   Collection Time: 04/27/20  3:17 PM  Result Value Ref Range   WBC 17.6 (H) 4.0 - 10.5 K/uL   RBC 3.50 (L) 3.87 - 5.11 MIL/uL   Hemoglobin 10.5 (L) 12.0 - 15.0 g/dL   HCT 33.6 (L) 36.0 - 46.0 %   MCV 96.0 80.0 - 100.0 fL   MCH 30.0 26.0 - 34.0 pg   MCHC 31.3 30.0 - 36.0 g/dL   RDW 17.2 (H) 11.5 - 15.5 %   Platelets 364 150 - 400 K/uL   nRBC 0.2 0.0 - 0.2 %    Comment: Performed at [facility], [address] [place], [place] [address]  Troponin I (High Sensitivity)     Status: None   Collection Time: 04/27/20  5:30 PM  Result Value Ref Range   Troponin I (High Sensitivity) 13 <18 ng/L    Comment: (NOTE) Elevated high sensitivity troponin I (hsTnI) values and significant  changes across serial measurements may suggest ACS but many other  chronic and acute conditions are known to elevate hsTnI results.  Refer to the "Links" section for chest pain algorithms and additional   guidance. Performed at [facility], [address], [place], [place] [address]   Hepatic function panel     Status: Abnormal   Collection Time: 04/27/20  5:30 PM  Result Value Ref Range   Total Protein 6.8 6.5 - 8.1 g/dL   Albumin 2.2 (L) 3.5 - 5.0 g/dL   AST 21 15 - 41 U/L   ALT 16 0 - 44 U/L   Alkaline Phosphatase 75 38 - 126 U/L   Total Bilirubin 0.5 0.3 - 1.2 mg/dL   Bilirubin, Direct <0.1 0.0 - 0.2 mg/dL   Indirect Bilirubin NOT CALCULATED 0.3 - 0.9 mg/dL    Comment: Performed at [facility], [address] [place], [place] [address]  CBG monitoring, ED     Status: None   Collection Time: 04/27/20  5:32 PM  Result Value Ref Range   Glucose-Capillary 91 70 - 99 mg/dL    Comment: Glucose reference range applies only to samples taken after fasting for at least 8 hours.  Resp Panel by RT-PCR (Flu A&B,  Covid) Nasopharyngeal Swab     Status: None   Collection Time: 04/27/20  7:00 PM   Specimen: Nasopharyngeal Swab; Nasopharyngeal(NP) swabs in vial transport medium  Result Value Ref Range   SARS Coronavirus 2 by RT PCR NEGATIVE NEGATIVE    Comment: (NOTE) SARS-CoV-2 target nucleic acids are NOT DETECTED.  The SARS-CoV-2 RNA is generally detectable in upper respiratory specimens during the acute phase of infection. The lowest concentration of SARS-CoV-2 viral copies this assay can detect is 138 copies/mL. A negative result does not preclude SARS-Cov-2 infection and should not be used as the sole basis for treatment or other patient management decisions. A negative result may occur with  improper specimen collection/handling, submission of specimen other than nasopharyngeal swab, presence of viral mutation(s) within the areas targeted by this assay, and inadequate number of viral copies(<138 copies/mL). A negative result must be combined with clinical observations, patient history, and epidemiological information. The expected result is Negative.  Fact Sheet for Patients:   EntrepreneurPulse.com.au  Fact Sheet for Healthcare Providers:  IncredibleEmployment.be  This test is no t yet approved or cleared by the [place] FDA and  has been authorized for detection and/or diagnosis of SARS-CoV-2 by FDA under an Emergency Use Authorization (EUA). This EUA will remain  in effect (meaning this test can be used) for the duration of the COVID-19 declaration under Section 564(b)(1) of the Act, 21 U.S.C.section 360bbb-3(b)(1), unless the authorization is terminated  or revoked sooner.       Influenza A by PCR NEGATIVE NEGATIVE   Influenza B by PCR NEGATIVE NEGATIVE    Comment: (NOTE) The Xpert Xpress SARS-CoV-2/FLU/RSV plus assay is intended as an aid in the diagnosis of influenza from Nasopharyngeal swab specimens and should not be used as a sole basis for treatment. Nasal washings and aspirates are unacceptable for Xpert Xpress SARS-CoV-2/FLU/RSV testing.  Fact Sheet for Patients: EntrepreneurPulse.com.au  Fact Sheet for Healthcare Providers: IncredibleEmployment.be  This test is not yet approved or cleared by the [place] FDA and has been authorized for detection and/or diagnosis of SARS-CoV-2 by FDA under an Emergency Use Authorization (EUA). This EUA will remain in effect (meaning this test can be used) for the duration of the COVID-19 declaration under Section 564(b)(1) of the Act, 21 U.S.C. section 360bbb-3(b)(1), unless the authorization is terminated or revoked.  Performed at [facility], [address] [place], [place] [address]     Chemistries  Recent Labs  Lab 04/27/20 1517 04/27/20 1730  NA 135  --   K 4.2  --   CL 104  --   CO2 17*  --   GLUCOSE 114*  --   BUN 90*  --   CREATININE 3.12*  --   CALCIUM 7.8*  --   AST  --  21  ALT  --  16  ALKPHOS  --  75  BILITOT  --  0.5    ------------------------------------------------------------------------------------------------------------------  ------------------------------------------------------------------------------------------------------------------ GFR: Estimated Creatinine Clearance: 10.7 mL/min (A) (by C-G formula based on SCr of 3.12 mg/dL (H)). Liver Function Tests: Recent Labs  Lab 04/27/20 1730  AST 21  ALT 16  ALKPHOS 75  BILITOT 0.5  PROT 6.8  ALBUMIN 2.2*   No results for input(s): LIPASE, AMYLASE in the last 168 hours. No results for input(s): AMMONIA in the last 168 hours. Coagulation Profile: No results for input(s): INR, PROTIME in the last 168 hours. Cardiac Enzymes: No results for input(s): CKTOTAL, CKMB, CKMBINDEX, TROPONINI in the last 168 hours. BNP (  last 3 results) No results for input(s): PROBNP in the last 8760 hours. HbA1C: No results for input(s): HGBA1C in the last 72 hours. CBG: Recent Labs  Lab 04/27/20 1512 04/27/20 1732  GLUCAP 66* 91   Lipid Profile: No results for input(s): CHOL, HDL, LDLCALC, TRIG, CHOLHDL, LDLDIRECT in the last 72 hours. Thyroid Function Tests: No results for input(s): TSH, T4TOTAL, FREET4, T3FREE, THYROIDAB in the last 72 hours. Anemia Panel: No results for input(s): VITAMINB12, FOLATE, FERRITIN, TIBC, IRON, RETICCTPCT in the last 72 hours.  --------------------------------------------------------------------------------------------------------------- Urine analysis:    Component Value Date/Time   COLORURINE YELLOW 08/25/2013 1212   APPEARANCEUR HAZY (A) 08/25/2013 1212   LABSPEC >1.030 (H) 08/25/2013 1212   PHURINE 6.0 08/25/2013 1212   GLUCOSEU NEGATIVE 08/25/2013 1212   HGBUR NEGATIVE 08/25/2013 1212   BILIRUBINUR SMALL (A) 08/25/2013 1212   KETONESUR TRACE (A) 08/25/2013 1212   PROTEINUR 30 (A) 08/25/2013 1212   UROBILINOGEN 1.0 08/25/2013 1212   NITRITE POSITIVE (A) 08/25/2013 1212   LEUKOCYTESUR NEGATIVE 08/25/2013 1212       Imaging Results:    DG Chest Portable 1 View  Result Date: 04/27/2020 CLINICAL DATA:  weakness EXAM: PORTABLE CHEST 1 VIEW COMPARISON:  08/26/2013. FINDINGS: No focal consolidation. No pneumothorax or pleural effusion. Stable cardiomediastinal silhouette. Multilevel spondylosis. IMPRESSION: No focal airspace disease. Electronically Signed   By: Primitivo Gauze M.D.   On: 04/27/2020 17:33    My personal review of EKG: Rhythm NSR, Rate 93 /min, QTc 472,no Acute ST changes   Assessment & Plan:    Principal Problem:   AKI (acute kidney injury) ([place]) Active Problems:   Syncope and collapse   Dehydration   Moderate protein-calorie malnutrition ([facility])   Cellulitis   HTN (hypertension)   1. AKI 1. Creatinine 2015 was 0.81, today 3.12 2. Started on hydrochlorothiazide in November -over diuresed? 3. Hold hydrochlorothiazide, hold ARB 4. 500 mL bolus in ED, continue gentle hydration as patient has issues with peripheral edema, and low albumin, we do not want to worsen her lower extremity edema with too much fluid 5. Daily use of multiple Advil and BC powders -advised on the importance of decreasing NSAIDs 6. Trend with CMP in a.m. 2. Syncope and collapse 1. Unwitnessed 2. Patient is not sure if she lost consciousness 3. Patient is not sure if she hit her head 4. CT head pending 5. Echo in the a.m. 6. Last carotid Doppler was in 2018 and showed 70% stenosis, repeat carotid Doppler 7. Monitor on telemetry 8. Most likely contributor is orthostasis and volume down status -check orthostatic vitals every shift 3. Dehydration 1. Patient presented hypotensive with a blood pressure of 82/61 2. Patient is tachycardic with a heart rate of 110 3. AKI with a creatinine of 3.12 4. All secondary to poor p.o. intake over the last 2 weeks 5. See plan above 4. Protein calorie malnutrition 1. Albumin 2.2 2. Reported decrease of p.o. intake and appetite over the last 2 weeks 3. Discussed  importance of nutrient intake to help wound healing 4. Ensure between meals 5. Heart healthy diet 6. Dietitian consulted -per lower extremity wound order set 5. Bilateral lower extremity wounds 1. Patient recently saw vascular surgery in the office in November 2. They recommended changing Medihoney which she was getting at the wound care center and any pen to Silvadene 3. Continue Silvadene 4. With vascular surgery and the wound care center have recommended compression -patient has refused compression in the outpatient setting 5. Consider  Unna boot? 6. Patient reports severe burning from wounds -pain scale ordered 6. Decreased bicarb 1. Secondary to AKI 2. Continue treatments as above and monitor in the a.m. with CMP 7. Cellulitis 1. Venous stasis definitely contributing 2. With erythema, painful wounds, white blood cell count 17.6 -suspect infection 3. Vancomycin started 4. Trend leukocytosis in the a.m. 8. Hypertension 1. Laying hydrochlorothiazide and losartan due to AKI 2. Hydralazine as needed   DVT Prophylaxis-Heparin -no SCDs secondary to patient's wounds and pain in the lower extremities  AM Labs Ordered, also please review Full Orders  Family Communication: Admission, patients condition and plan of care including tests being ordered have been discussed with the patient and husband of 41 years who indicate understanding and agree with the plan and Code Status.  Code Status: DNR  Admission status: Inpatient :The appropriate admission status for this patient is INPATIENT. Inpatient status is judged to be reasonable and necessary in order to provide the required intensity of service to ensure the patient's safety. The patient's presenting symptoms, physical exam findings, and initial radiographic and laboratory data in the context of their chronic comorbidities is felt to place them at high risk for further clinical deterioration. Furthermore, it is not anticipated that the  patient will be medically stable for discharge from the hospital within 2 midnights of admission. The following factors support the admission status of inpatient.     The patient's presenting symptoms include syncope. The worrisome physical exam findings include tachycardia with a heart rate of 110, weeping and erythema of lower extremities bilaterally The initial radiographic and laboratory data are worrisome because of white blood cell count of 17.6, AKI with a creatinine of 0.97, metabolic acidosis with a bicarb of 17 The chronic co-morbidities include hypertension, carotid artery occlusion, venous stasis lower extremities       * I certify that at the point of admission it is my clinical judgment that the patient will require inpatient hospital care spanning beyond 2 midnights from the point of admission due to high intensity of service, high risk for further deterioration and high frequency of surveillance required.*  Time spent in minutes : [place]

## 2020-04-27 NOTE — ED Notes (Signed)
Attempted to call report x 1  

## 2020-04-27 NOTE — ED Notes (Signed)
Pt resting in bed, purewick in place to obtain urine sample. Husband at bedside feeding pt, dinner okay per Dr. Alvino Chapel. Pt made aware of pending admission, covid swab obtained and walked to lab. Pt on monitoring. Pt and husband have concerns for BLE wounds currently weeping and covered with compression wraps. Dr. Alvino Chapel made aware of pt concerns, aware that VSS, IVF have been completed and that they want to speak with him. BLE removed for Dr to assess, Dr. Alvino Chapel at bedside.

## 2020-04-28 ENCOUNTER — Inpatient Hospital Stay (HOSPITAL_COMMUNITY): Payer: Medicare Other

## 2020-04-28 DIAGNOSIS — I361 Nonrheumatic tricuspid (valve) insufficiency: Secondary | ICD-10-CM

## 2020-04-28 DIAGNOSIS — R55 Syncope and collapse: Secondary | ICD-10-CM

## 2020-04-28 LAB — COMPREHENSIVE METABOLIC PANEL
ALT: 21 U/L (ref 0–44)
AST: 39 U/L (ref 15–41)
Albumin: 2.1 g/dL — ABNORMAL LOW (ref 3.5–5.0)
Alkaline Phosphatase: 74 U/L (ref 38–126)
Anion gap: 13 (ref 5–15)
BUN: 86 mg/dL — ABNORMAL HIGH (ref 8–23)
CO2: 19 mmol/L — ABNORMAL LOW (ref 22–32)
Calcium: 7.8 mg/dL — ABNORMAL LOW (ref 8.9–10.3)
Chloride: 104 mmol/L (ref 98–111)
Creatinine, Ser: 2.34 mg/dL — ABNORMAL HIGH (ref 0.44–1.00)
GFR, Estimated: 20 mL/min — ABNORMAL LOW (ref >60)
Glucose, Bld: 135 mg/dL — ABNORMAL HIGH (ref 70–99)
Potassium: 4.1 mmol/L (ref 3.5–5.1)
Sodium: 136 mmol/L (ref 135–145)
Total Bilirubin: 0.5 mg/dL (ref 0.3–1.2)
Total Protein: 6.5 g/dL (ref 6.5–8.1)

## 2020-04-28 LAB — CBC WITH DIFFERENTIAL/PLATELET
Abs Immature Granulocytes: 0.7 10*3/uL — ABNORMAL HIGH (ref 0.00–0.07)
Basophils Absolute: 0.2 10*3/uL — ABNORMAL HIGH (ref 0.0–0.1)
Basophils Relative: 1 %
Eosinophils Absolute: 0 10*3/uL (ref 0.0–0.5)
Eosinophils Relative: 0 %
HCT: 33.7 % — ABNORMAL LOW (ref 36.0–46.0)
Hemoglobin: 10.7 g/dL — ABNORMAL LOW (ref 12.0–15.0)
Immature Granulocytes: 3 %
Lymphocytes Relative: 7 %
Lymphs Abs: 1.5 10*3/uL (ref 0.7–4.0)
MCH: 30.5 pg (ref 26.0–34.0)
MCHC: 31.8 g/dL (ref 30.0–36.0)
MCV: 96 fL (ref 80.0–100.0)
Monocytes Absolute: 1.7 10*3/uL — ABNORMAL HIGH (ref 0.1–1.0)
Monocytes Relative: 8 %
Neutro Abs: 17.4 10*3/uL — ABNORMAL HIGH (ref 1.7–7.7)
Neutrophils Relative %: 81 %
Platelets: 403 10*3/uL — ABNORMAL HIGH (ref 150–400)
RBC: 3.51 MIL/uL — ABNORMAL LOW (ref 3.87–5.11)
RDW: 17 % — ABNORMAL HIGH (ref 11.5–15.5)
WBC: 21.5 10*3/uL — ABNORMAL HIGH (ref 4.0–10.5)
nRBC: 0 % (ref 0.0–0.2)

## 2020-04-28 LAB — C-REACTIVE PROTEIN: CRP: 9.9 mg/dL — ABNORMAL HIGH (ref <1.0)

## 2020-04-28 LAB — ECHOCARDIOGRAM COMPLETE
AR max vel: 2.52 cm2
AV Area VTI: 2.76 cm2
AV Area mean vel: 2.31 cm2
AV Mean grad: 7.9 mmHg
AV Peak grad: 17.2 mmHg
Ao pk vel: 2.07 m/s
Area-P 1/2: 2.08 cm2
Height: 63 in
S' Lateral: 1.93 cm
Weight: 2160 oz

## 2020-04-28 LAB — SEDIMENTATION RATE: Sed Rate: 120 mm/hr — ABNORMAL HIGH (ref 0–22)

## 2020-04-28 LAB — MAGNESIUM: Magnesium: 2.6 mg/dL — ABNORMAL HIGH (ref 1.7–2.4)

## 2020-04-28 LAB — PREALBUMIN: Prealbumin: 16.3 mg/dL — ABNORMAL LOW (ref 18–38)

## 2020-04-28 MED ORDER — SILVER SULFADIAZINE 1 % EX CREA
1.0000 "application " | TOPICAL_CREAM | Freq: Every day | CUTANEOUS | Status: DC
  Administered 2020-04-28 – 2020-05-06 (×8): 1 via TOPICAL
  Filled 2020-04-28 (×2): qty 85
  Filled 2020-04-28: qty 50

## 2020-04-28 MED ORDER — MORPHINE SULFATE (PF) 2 MG/ML IV SOLN
2.0000 mg | INTRAVENOUS | Status: DC | PRN
  Administered 2020-04-30: 2 mg via INTRAVENOUS
  Filled 2020-04-28: qty 1

## 2020-04-28 MED ORDER — ONDANSETRON HCL 4 MG/2ML IJ SOLN: 4.0000 mg | Freq: Four times a day (QID) | INTRAMUSCULAR | Status: DC | PRN

## 2020-04-28 MED ORDER — JUVEN PO PACK
1.0000 | PACK | Freq: Two times a day (BID) | ORAL | Status: DC
  Administered 2020-04-28 – 2020-05-05 (×13): 1 via ORAL
  Filled 2020-04-28 (×12): qty 1

## 2020-04-28 MED ORDER — ATORVASTATIN CALCIUM 20 MG PO TABS
20.0000 mg | ORAL_TABLET | Freq: Every day | ORAL | Status: DC
  Administered 2020-04-28 – 2020-05-06 (×8): 20 mg via ORAL
  Filled 2020-04-28 (×10): qty 1

## 2020-04-28 MED ORDER — ACETAMINOPHEN 325 MG PO TABS
650.0000 mg | ORAL_TABLET | Freq: Four times a day (QID) | ORAL | Status: DC | PRN
  Administered 2020-04-28 – 2020-05-06 (×9): 650 mg via ORAL
  Filled 2020-04-28 (×10): qty 2

## 2020-04-28 MED ORDER — ADULT MULTIVITAMIN W/MINERALS CH
1.0000 | ORAL_TABLET | Freq: Every day | ORAL | Status: DC
  Administered 2020-04-28 – 2020-05-06 (×8): 1 via ORAL
  Filled 2020-04-28 (×10): qty 1

## 2020-04-28 MED ORDER — ENSURE ENLIVE PO LIQD
237.0000 mL | Freq: Two times a day (BID) | ORAL | Status: DC
  Administered 2020-04-28 – 2020-05-05 (×8): 237 mL via ORAL

## 2020-04-28 MED ORDER — COLLAGENASE 250 UNIT/GM EX OINT
TOPICAL_OINTMENT | Freq: Every day | CUTANEOUS | Status: DC
  Administered 2020-05-06: 1 via TOPICAL
  Filled 2020-04-28 (×2): qty 30

## 2020-04-28 MED ORDER — HYDRALAZINE HCL 20 MG/ML IJ SOLN: 10.0000 mg | Freq: Four times a day (QID) | INTRAMUSCULAR | Status: DC | PRN

## 2020-04-28 MED ORDER — HEPARIN SODIUM (PORCINE) 5000 UNIT/ML IJ SOLN
5000.0000 [IU] | Freq: Three times a day (TID) | INTRAMUSCULAR | Status: DC
  Administered 2020-04-28 – 2020-05-06 (×25): 5000 [IU] via SUBCUTANEOUS
  Filled 2020-04-28 (×25): qty 1

## 2020-04-28 MED ORDER — GABAPENTIN 300 MG PO CAPS
300.0000 mg | ORAL_CAPSULE | Freq: Two times a day (BID) | ORAL | Status: DC
  Administered 2020-04-28 – 2020-05-06 (×16): 300 mg via ORAL
  Filled 2020-04-28 (×17): qty 1

## 2020-04-28 MED ORDER — ONDANSETRON HCL 4 MG PO TABS: 4.0000 mg | ORAL_TABLET | Freq: Four times a day (QID) | ORAL | Status: DC | PRN

## 2020-04-28 MED ORDER — POLYETHYLENE GLYCOL 3350 17 G PO PACK: 17.0000 g | PACK | Freq: Every day | ORAL | Status: DC | PRN

## 2020-04-28 MED ORDER — ACETAMINOPHEN 650 MG RE SUPP
650.0000 mg | Freq: Four times a day (QID) | RECTAL | Status: DC | PRN
  Administered 2020-04-28: 23:00:00 650 mg via RECTAL
  Filled 2020-04-28: qty 1

## 2020-04-28 MED ORDER — OXYCODONE HCL 5 MG PO TABS
5.0000 mg | ORAL_TABLET | ORAL | Status: DC | PRN
  Administered 2020-04-28 – 2020-05-02 (×4): 5 mg via ORAL
  Filled 2020-04-28 (×4): qty 1

## 2020-04-28 MED ORDER — ASPIRIN EC 81 MG PO TBEC
81.0000 mg | DELAYED_RELEASE_TABLET | Freq: Every day | ORAL | Status: DC
  Administered 2020-04-28 – 2020-05-06 (×8): 81 mg via ORAL
  Filled 2020-04-28 (×10): qty 1

## 2020-04-28 MED ORDER — SODIUM CHLORIDE 0.9 % IV BOLUS
500.0000 mL | Freq: Once | INTRAVENOUS | Status: AC
  Administered 2020-04-28: 500 mL via INTRAVENOUS

## 2020-04-28 MED ORDER — VANCOMYCIN HCL IN DEXTROSE 1-5 GM/200ML-% IV SOLN: 1000.0000 mg | Freq: Once | INTRAVENOUS | Status: DC

## 2020-04-28 MED ORDER — SODIUM CHLORIDE 0.9 % IV SOLN: INTRAVENOUS | Status: AC

## 2020-04-28 NOTE — TOC Initial Note (Signed)
Transition of Care [facility]) - Initial/Assessment Note   Patient Details  Name: [name] [name] MRN: [id number] Date of Birth: Dec 03, 1931  Transition [facility] Care [facility]) CM/SW Contact:    Sherie Don, LCSW Phone Number: 04/28/2020, 2:38 PM  Clinical Narrative: Patient is an 86 year old female who was admitted for AKI. Patient currently goes to OP wound care. PT evaluation recommended HHPT and rolling walker. CSW spoke with patient's husband, Gene Vivas, regarding PT recommendations. Per husband, he would like the patient to receive HHPT but is unsure if patient will decline or accept it and requested to discuss [place] with her today. Husband agreeable to DME referral to [employer] for a rolling walker. Referral made to [facility] with [employer] for walker. Order has been placed. TOC to follow.  Expected Discharge Plan: [place] Barriers to Discharge: Continued Medical Work up  Patient Goals and CMS Choice Patient states their goals for this hospitalization and ongoing recovery are:: "Get stronger in her legs" CMS Medicare.gov Compare Post Acute Care list provided to:: Patient Represenative (must comment) (Gene Mangine (husband)) Choice offered to / list presented to : Spouse  Expected Discharge Plan and Services Expected Discharge Plan: [place] In-house Referral: Clinical Social Work Discharge Planning Services: NA Post Acute Care Choice: Home Health,Durable Medical Equipment Living arrangements for the past 2 months: Single Family Home             DME Arranged: Walker rolling DME Agency: [employer]  Prior Living Arrangements/Services Living arrangements for the past 2 months: Single Family Home Lives with:: Spouse Patient language and need for interpreter reviewed:: Yes Do you feel safe going back to the place where you live?: Yes      Need for Family Participation in Patient Care: Yes (Comment) Care giver support system in place?: Yes (comment) Criminal Activity/Legal  Involvement Pertinent to Current Situation/Hospitalization: No - Comment as needed  Activities of Daily Living Home Assistive Devices/Equipment: Eyeglasses ADL Screening (condition at time of admission) Patient's cognitive ability adequate to safely complete daily activities?: No Is the patient deaf or have difficulty hearing?: Yes Does the patient have difficulty seeing, even when wearing glasses/contacts?: No Does the patient have difficulty concentrating, remembering, or making decisions?: Yes Patient able to express need for assistance with ADLs?: No Does the patient have difficulty dressing or bathing?: Yes Independently performs ADLs?: No Communication: Independent Dressing (OT): Needs assistance Is this a change from baseline?: Change from baseline, expected to last <3days Grooming: Needs assistance Is this a change from baseline?: Change from baseline, expected to last <3 days Feeding: Needs assistance Is this a change from baseline?: Change from baseline, expected to last <3 days Bathing: Needs assistance Is this a change from baseline?: Change from baseline, expected to last <3 days Toileting: Needs assistance Is this a change from baseline?: Change from baseline, expected to last <3 days In/Out Bed: Needs assistance Is this a change from baseline?: Change from baseline, expected to last <3 days Walks in Home: Needs assistance Is this a change from baseline?: Change from baseline, expected to last <3 days Does the patient have difficulty walking or climbing stairs?: Yes Weakness of Legs: Both Weakness of Arms/Hands: None  Emotional Assessment Appearance:: Appears stated age Orientation: : Oriented to Self,Oriented to Place,Oriented to  Time,Oriented to Situation Alcohol / Substance Use: Not Applicable Psych Involvement: No (comment)  Admission diagnosis:  AKI (acute kidney injury) ([place]) [N17.9] Cellulitis of lower extremity, unspecified laterality [L03.119] Patient  Active  Problem List   Diagnosis Date Noted  . AKI (acute kidney injury) ([place]) 04/27/2020  . Syncope and collapse 04/27/2020  . Dehydration 04/27/2020  . Moderate protein-calorie malnutrition ([place]) 04/27/2020  . Cellulitis 04/27/2020  . HTN (hypertension) 04/27/2020  . History of colon cancer 12/24/2017  . Colon cancer ([place]) 09/11/2013  . Small bowel obstruction ([place]) 08/25/2013  . Tachycardia 08/25/2013  . Lesion of colon 08/25/2013  . Anemia 08/25/2013  . SBO (small bowel obstruction) ([place]) 08/25/2013   PCP:  Asencion Noble, MD Pharmacy:   [facility], [place] [address] [place] [address] Phone: [phone] Fax: [phone]  Readmission Risk Interventions No flowsheet data found.

## 2020-04-28 NOTE — Plan of Care (Signed)

## 2020-04-28 NOTE — Progress Notes (Signed)
   04/28/20 0800  Assess: MEWS Score  Level of Consciousness Alert  Assess: MEWS Score  MEWS Temp 0  MEWS Systolic 0  MEWS Pulse 2  MEWS RR 0  MEWS LOC 0  MEWS Score 2  MEWS Score Color Yellow  Assess: if the MEWS score is Yellow or Red  MEWS guidelines implemented *See Row Information* No, vital signs rechecked

## 2020-04-28 NOTE — Consult Note (Signed)
WOC Nurse Consult Note: Reason for Consult: Chronic nonhealing venous stasis wounds. Vascular consult pending but ABI Was performed outpatient and is within normal limits and safe to compress.  Patient cannot tolerate compression of any kind, including ace wraps. She is in constant pain and elevation is painful as well. Outpatient plan was diuresis to manage edema and get pain under control.   Wound type: venous insufficiency.  Pressure Injury POA: NA Measurement: Scattered nonintact lesions to bilateral anterior lower legs.  Wound bed: yellow slough to wound bed.  Drainage (amount, consistency, odor) moderate serosanguinous weeping.  Intense pain.  Periwound:edema and chronic skin changes to lower legs  Intense pain to lower legs.  Refuses compression and cannot tolerate much elevation.  Dressing procedure/placement/frequency: Cleanse wounds to bilateral anterior lower legs.  Apply santyl to open wounds. Cover with NS moist gauze.  Secure with ABD pads and kerlix/tape.  Change daily.  Will not follow at this time.  Please re-consult if needed.  Domenic Moras MSN, RN, FNP-BC CWON Wound, Ostomy, Continence Nurse Pager [phone]

## 2020-04-28 NOTE — Progress Notes (Signed)
MD notified of pt MEWS 4. Temp 103.1 P 127 R 20 BP 105/50. Pt would not take any meds po, gave tylenol suppository. Charge nurse notified, will reassess. Waiting new orders

## 2020-04-28 NOTE — Progress Notes (Signed)
Initial Nutrition Assessment  DOCUMENTATION CODES:   Not applicable  INTERVENTION:  Ensure Enlive po BID, each supplement provides 350 kcal and 20 grams of protein (likes chocolate)  Juven BID, each packet provides 95 calories, 2.5 grams of protein (collagen), and 9.8 grams of carbohydrate (3 grams sugar); also contains 7 grams of L-arginine and L-glutamine, 300 mg vitamin C, 15 mg vitamin E, 1.2 mcg vitamin B-12, 9.5 mg zinc, 200 mg calcium, and 1.5 g  Calcium Beta-hydroxy-Beta-methylbutyrate to support wound healing  MVI with minerals daily  Dysphagia 3 diet for ease of intake   Food preferences updated in HealthTouch  Recommend monitoring magnesium, potassium, and phosphorus daily for at least 3 days, MD to replete as needed, as pt is at risk for refeeding syndrome as noted below   NUTRITION DIAGNOSIS:   Increased nutrient needs related to wound healing (chronic BLE venous stasis ulcer) as evidenced by estimated needs.   GOAL:   Patient will meet greater than or equal to 90% of their needs    MONITOR:   PO intake,Supplement acceptance,Skin,I & O's,Labs,Weight trends  REASON FOR ASSESSMENT:   Malnutrition Screening Tool,Consult Wound healing  ASSESSMENT:  RD working remotely.   86 year old female with history of HTN, carotid artery occlusion, colon cancer s/p right hemicolectomy (2015), venous stasis changes of lower extremities presented after episode of syncope at home and 2 month history of lower extremity pain related to venous stasis ulcers.  RD spoke with daughter of pt via phone, reports pt is currently off floor. Nutrition history provided from daughter. Reports patient eats lunch and dinner meals at baseline, likes vegetables and does not eat much protein. Patient wears dentures with recent complaints of chewing difficulties secondary to them being worn down. Daughter states patient's appetite has been "terrible" and has not been eating/drinking over the past  week, recalls a few small glasses of water in the last 2 days. Daughter reports she last saw her mom in November and endorses weight loss, recalls usual weight around 145 lbs. Limited recent history for review. Per chart, she weighed 62.1 kg (136.62 lbs) on 11/29 and 61.2 kg (134.64 lbs) on admission. Current fluid status masking actual weight, noted BLE deep pitting edema per flowsheet. She is ~10 lbs (7%) under usual weight which is significant. Given very poor po and reported weight loss, highly suspect degree of acute malnutrition, however unable to identify at this time. Will plan to complete exam at follow-up. Patient is at risk for refeeding given above, recommend monitoring magnesium, potassium, and phosphorus daily for at least 3 days, MD to replete as needed. Daughter reports pt had a chocolate Ensure this morning and really liked it. Will continue Ensure BID and will order Juven to support wound healing. RD encouraged continuing oral supplements at home, recommended trying small frequent meals/snacks throughout the day and discussed good sources of plant-based protein. RD has downgraded diet to dysphagia 3 for ease of intake and updated food preferences in HealthTouch.   Medications reviewed and include: Gabapentin, MVI, Vancomycin  IVF: NaCl @ 75 ml/hr  Labs: BUN 86 (H) trending down Cr 2.34 (H) trending down, Mg 2.6 (H), WBC 21.5 (H), Hgb 10.7 (L), HCT 33.7 (L)  NUTRITION - FOCUSED PHYSICAL EXAM: Unable to complete at this time, RD working remotely.    Diet Order:   Diet Order            Diet Heart Room service appropriate? Yes; Fluid consistency: Thin  Diet effective now  EDUCATION NEEDS:   Education needs have been addressed  Skin:  Skin Assessment: Skin Integrity Issues: Skin Integrity Issues:: Other (Comment) Other: chronic nonhealing BLE venous stasis ulcers; scattered nonintact lesions;moderate serosanguinous weeping  Last BM:  pta  Height:   Ht  Readings from Last 1 Encounters:  04/27/20 5\' 3"  (1.6 m)    Weight:   Wt Readings from Last 1 Encounters:  04/27/20 61.2 kg    BMI:  Body mass index is 23.91 kg/m.  Estimated Nutritional Needs:   Kcal:  1700-1900  Protein:  90-100  Fluid:  >1.5 L/day   Lajuan Lines, RD, LDN Clinical Nutrition After Hours/Weekend Pager # in [place]

## 2020-04-28 NOTE — Progress Notes (Signed)
PROGRESS NOTE    [name] [name]  GQQ:[id number] DOB: August 12, 1931 DOA: 04/27/2020 PCP: Asencion Noble, MD    Brief Narrative:  6 female with history of hypertension, coronary artery stenosis, venous stasis ulcers of lower extremities and recently worsening leg ulcers presented to the ER with pain, leg wounds, episodes of less responsiveness that patient describes as painful conditions. Recently suffering from difficult to treat leg wounds, started on diuretics and going to outpatient wound center. At the emergency room, afebrile. Initial blood pressure 82/61. WC count 17.6. Creatinine 3.12, recent baseline from 6 years ago was normal. Patient was given IV fluid, pain medications and antibiotics and admitted. COVID-19 negative. Chest x-ray normal.   Assessment & Plan:   Principal Problem:   AKI (acute kidney injury) ([place]) Active Problems:   Syncope and collapse   Dehydration   Moderate protein-calorie malnutrition ([facility])   Cellulitis   HTN (hypertension)  Syncope and collapse with orthostasis, hypotension, moderate dehydration and acute kidney injury: Probably acute kidney injury due to combination of active infection, dehydration and diuretics. Patient also on ARB along with hydrochlorothiazide. Patient also uses NSAIDs. -Blood pressures already improving with IV fluids, renal functions improving and adequate, urine output is adequate. Hold all antihypertensives and diuretics. Will hydrate with IV fluids in the hospital. -Less likely cardiac cause, echocardiogram and carotid duplexes were ordered. She does not have any focal deficits.  Bilateral lower extremity wounds/infected wounds with elevated white cell count: -Seen by wound care team. Local wound care. Because of elevated WBC count and weeping wounds, will treat with IV vancomycin today with plan to ultimately change to oral antibiotics for 10 to 14 days. -Patient is already establishing with outpatient wound care services, she will  definitely benefit with ongoing regular follow-up, may benefit with compression and Unna boot. -Seen by vascular surgery as outpatient, reportedly normal ABI.  Hypertension: Blood pressures low. Discontinue hydrochlorothiazide and losartan due to AKI. Will start patient on alternative blood pressure medications once blood pressure improved.   DVT prophylaxis: heparin injection 5,000 Units Start: 04/28/20 0600   Code Status: DNR Family Communication: Daughter at the bedside Disposition Plan: Status is: Inpatient  Remains inpatient appropriate because:Inpatient level of care appropriate due to severity of illness   Dispo: The patient is from: Home              Anticipated d/c is to: Home              Anticipated d/c date is: 2 days              Patient currently is not medically stable to d/c.         Consultants:   Wound care team  Procedures:   None  Antimicrobials:  Antibiotics Given (last 72 hours)    Date/Time Action Medication Dose Rate   04/27/20 2218 New Bag/Given   vancomycin (VANCOREADY) IVPB 1250 mg/250 mL 1,250 mg 166.7 mL/hr         Subjective: Patient seen and examined. She is hard of hearing. No overnight events. Examined her walking in the hallway with physical therapist with a walker, she denied any dizziness or lightheadedness. Her main problem is pain in her legs. Her daughter was at the bedside. Patient stated she had dizziness and excruciating pain at home. No other overnight events.  Objective: Vitals:   04/27/20 2100 04/27/20 2144 04/28/20 0200 04/28/20 0512  BP: 95/75 140/61 135/60 (!) 130/54  Pulse: (!) 113 76 70 (!) 118  Resp: 19  18 18 16   Temp:  98.4 F (36.9 C) 98.3 F (36.8 C) (!) 97.3 F (36.3 C)  TempSrc:      SpO2: 100% 92% 94% 100%  Weight:      Height:        Intake/Output Summary (Last 24 hours) at 04/28/2020 1110 Last data filed at 04/27/2020 2022 Gross per 24 hour  Intake 500 ml  Output --  Net 500 ml   Filed  Weights   04/27/20 1507  Weight: 61.2 kg    Examination:  General exam: Appears calm and comfortable  Hard of hearing. Has some discomfort on her legs on mobility. Respiratory system: Clear to auscultation. Respiratory effort normal. No added sounds. Cardiovascular system: S1 & S2 heard, RRR. No JVD, murmurs, rubs, gallops or clicks.  Gastrointestinal system: Abdomen is nondistended, soft and nontender. No organomegaly or masses felt. Normal bowel sounds heard. Central nervous system: Alert and oriented. No focal neurological deficits. Extremities: Symmetric 5 x 5 power. Skin:  Bilateral leg has extensive wounds, yellow slough to the wound bed with moderate serosanguineous weeping. Some edema and chronic skin changes of the legs.    Data Reviewed: I have personally reviewed following labs and imaging studies  CBC: Recent Labs  Lab 04/27/20 1517 04/28/20 0543  WBC 17.6* 21.5*  NEUTROABS  --  17.4*  HGB 10.5* 10.7*  HCT 33.6* 33.7*  MCV 96.0 96.0  PLT 364 333*   Basic Metabolic Panel: Recent Labs  Lab 04/27/20 1517 04/28/20 0543  NA 135 136  K 4.2 4.1  CL 104 104  CO2 17* 19*  GLUCOSE 114* 135*  BUN 90* 86*  CREATININE 3.12* 2.34*  CALCIUM 7.8* 7.8*  MG  --  2.6*   GFR: Estimated Creatinine Clearance: 14.3 mL/min (A) (by C-G formula based on SCr of 2.34 mg/dL (H)). Liver Function Tests: Recent Labs  Lab 04/27/20 1730 04/28/20 0543  AST 21 39  ALT 16 21  ALKPHOS 75 74  BILITOT 0.5 0.5  PROT 6.8 6.5  ALBUMIN 2.2* 2.1*   No results for input(s): LIPASE, AMYLASE in the last 168 hours. No results for input(s): AMMONIA in the last 168 hours. Coagulation Profile: No results for input(s): INR, PROTIME in the last 168 hours. Cardiac Enzymes: No results for input(s): CKTOTAL, CKMB, CKMBINDEX, TROPONINI in the last 168 hours. BNP (last 3 results) No results for input(s): PROBNP in the last 8760 hours. HbA1C: No results for input(s): HGBA1C in the last 72  hours. CBG: Recent Labs  Lab 04/27/20 1512 04/27/20 1732  GLUCAP 66* 91   Lipid Profile: No results for input(s): CHOL, HDL, LDLCALC, TRIG, CHOLHDL, LDLDIRECT in the last 72 hours. Thyroid Function Tests: No results for input(s): TSH, T4TOTAL, FREET4, T3FREE, THYROIDAB in the last 72 hours. Anemia Panel: No results for input(s): VITAMINB12, FOLATE, FERRITIN, TIBC, IRON, RETICCTPCT in the last 72 hours. Sepsis Labs: No results for input(s): PROCALCITON, LATICACIDVEN in the last 168 hours.  Recent Results (from the past 240 hour(s))  Resp Panel by RT-PCR (Flu A&B, Covid) Nasopharyngeal Swab     Status: None   Collection Time: 04/27/20  7:00 PM   Specimen: Nasopharyngeal Swab; Nasopharyngeal(NP) swabs in vial transport medium  Result Value Ref Range Status   SARS Coronavirus 2 by RT PCR NEGATIVE NEGATIVE Final    Comment: (NOTE) SARS-CoV-2 target nucleic acids are NOT DETECTED.  The SARS-CoV-2 RNA is generally detectable in upper respiratory specimens during the acute phase of infection. The lowest concentration of  SARS-CoV-2 viral copies this assay can detect is 138 copies/mL. A negative result does not preclude SARS-Cov-2 infection and should not be used as the sole basis for treatment or other patient management decisions. A negative result may occur with  improper specimen collection/handling, submission of specimen other than nasopharyngeal swab, presence of viral mutation(s) within the areas targeted by this assay, and inadequate number of viral copies(<138 copies/mL). A negative result must be combined with clinical observations, patient history, and epidemiological information. The expected result is Negative.  Fact Sheet for Patients:  EntrepreneurPulse.com.au  Fact Sheet for Healthcare Providers:  IncredibleEmployment.be  This test is no t yet approved or cleared by the [place] FDA and  has been authorized for detection  and/or diagnosis of SARS-CoV-2 by FDA under an Emergency Use Authorization (EUA). This EUA will remain  in effect (meaning this test can be used) for the duration of the COVID-19 declaration under Section 564(b)(1) of the Act, 21 U.S.C.section 360bbb-3(b)(1), unless the authorization is terminated  or revoked sooner.       Influenza A by PCR NEGATIVE NEGATIVE Final   Influenza B by PCR NEGATIVE NEGATIVE Final    Comment: (NOTE) The Xpert Xpress SARS-CoV-2/FLU/RSV plus assay is intended as an aid in the diagnosis of influenza from Nasopharyngeal swab specimens and should not be used as a sole basis for treatment. Nasal washings and aspirates are unacceptable for Xpert Xpress SARS-CoV-2/FLU/RSV testing.  Fact Sheet for Patients: EntrepreneurPulse.com.au  Fact Sheet for Healthcare Providers: IncredibleEmployment.be  This test is not yet approved or cleared by the [place] FDA and has been authorized for detection and/or diagnosis of SARS-CoV-2 by FDA under an Emergency Use Authorization (EUA). This EUA will remain in effect (meaning this test can be used) for the duration of the COVID-19 declaration under Section 564(b)(1) of the Act, 21 U.S.C. section 360bbb-3(b)(1), unless the authorization is terminated or revoked.  Performed at [facility], [address] [place], [place] [address]   Blood Cultures x 2 sites     Status: None (Preliminary result)   Collection Time: 04/28/20  5:43 AM   Specimen: Left Antecubital; Blood  Result Value Ref Range Status   Specimen Description LEFT ANTECUBITAL  Final   Special Requests   Final    Blood Culture adequate volume BOTTLES DRAWN AEROBIC AND ANAEROBIC   Culture   Final    NO GROWTH <12 HOURS Performed at [facility], [address] [place], [place] [address]    Report Status PENDING  Incomplete  Blood Cultures x 2 sites     Status: None (Preliminary result)   Collection Time: 04/28/20  5:43  AM   Specimen: BLOOD RIGHT HAND  Result Value Ref Range Status   Specimen Description BLOOD RIGHT HAND  Final   Special Requests   Final    BOTTLES DRAWN AEROBIC AND ANAEROBIC Blood Culture adequate volume   Culture   Final    NO GROWTH <12 HOURS Performed at [facility], [address] [place], [place] [address]    Report Status PENDING  Incomplete         Radiology Studies: DG Chest Portable 1 View  Result Date: 04/27/2020 CLINICAL DATA:  weakness EXAM: PORTABLE CHEST 1 VIEW COMPARISON:  08/26/2013. FINDINGS: No focal consolidation. No pneumothorax or pleural effusion. Stable cardiomediastinal silhouette. Multilevel spondylosis. IMPRESSION: No focal airspace disease. Electronically Signed   By: Primitivo Gauze M.D.   On: 04/27/2020 17:33        Scheduled Meds: .  aspirin EC  81 mg Oral Daily  . atorvastatin  20 mg Oral Daily  . collagenase   Topical Daily  . feeding supplement  237 mL Oral BID BM  . gabapentin  300 mg Oral BID  . heparin  5,000 Units Subcutaneous Q8H  . multivitamin with minerals  1 tablet Oral Daily  . silver sulfADIAZINE  1 application Topical Daily   Continuous Infusions: . sodium chloride 75 mL/hr at 04/28/20 0446  . [START ON 04/29/2020] vancomycin       LOS: 1 day    Time spent: 35 minutes     Barb Merino, MD [facility] Pager [phone]

## 2020-04-28 NOTE — Plan of Care (Signed)
  Problem: Acute Rehab PT Goals(only PT should resolve) Goal: Pt Will Go Supine/Side To Sit Outcome: Progressing Flowsheets (Taken 04/28/2020 0941) Pt will go Supine/Side to Sit: with modified independence Goal: Patient Will Transfer Sit To/From Stand Outcome: Progressing Flowsheets (Taken 04/28/2020 0941) Patient will transfer sit to/from stand: with modified independence Goal: Pt Will Transfer Bed To Chair/Chair To Bed Outcome: Progressing Flowsheets (Taken 04/28/2020 0941) Pt will Transfer Bed to Chair/Chair to Bed:  with modified independence  with supervision Goal: Pt Will Ambulate Outcome: Progressing Flowsheets (Taken 04/28/2020 0941) Pt will Ambulate:  100 feet  with modified independence  with supervision  with rolling walker  with least restrictive assistive device   9:41 AM, 04/28/20 Lonell Grandchild, MPT Physical Therapist with [facility] [phone] office [phone] mobile phone

## 2020-04-28 NOTE — Evaluation (Signed)
Physical Therapy Evaluation Patient Details Name: [name] [name] MRN: [id number] DOB: 14-Dec-1931 Today's Date: 04/28/2020   History of Present Illness  [name] [name]  is a 86 y.o. female, of hypertension, carotid artery occlusion, hypertension, venous stasis changes of lower extremities, presents to the ED with a chief complaint of syncope. And a lot of pain and only intermittently answers questions secondary to the pain asking "please help me God," so husband at bedside provides most of the history. He reports that patient had got up from the recliner chair walk to the kitchen to make her self a cup of coffee, before she was able to get a coffee cup she felt herself falling backwards and could not stop it. This is what she told him about the event. It was unwitnessed. Patient reports that she did not have chest pain, palpitations, shortness of breath before the event. She is not sure if she hit her head. Neither of them are sure if she lost consciousness as he was in the back bedroom and did not hear her fall, only heard her calling for him after she was alert. He reports that she was immediately alert and oriented. She was not able to get up on her own, so he got her up and took her to the recliner chair. After some rest, patient was able to get up and walk around. She even did some household chores and went up and down stairs. It was time for them to go to their appointment at the independent [facility] when he noted that she was not talking right, and not looking at him right. He suspected that she had had a stroke. He reports that he does not hear very well, so he cannot describe how she was not talking right. He said it was not confusion, and he does not think she was slurring her speech, but that something was wrong with her voice. He also cannot further explain how she was not looking at him right, he just thought it was not normal. So they called Dr. Ria Comment office who recommended he come into the ER. The  time of my exam he reports that patient is talking correctly, and looking at him normally.    Clinical Impression  Patient demonstrates labored movement for sitting up at bedside with c/o pain in legs, unsteady on feet with tendency to lean on nearby objects for support, required use of RW for safety, demonstrates good return using RW for ambulation without loss of balance, limited mostly due to c/o increased pain in feet and tolerated sitting up in chair after therapy with her daughter present in room.  Patient will benefit from continued physical therapy in hospital and recommended venue below to increase strength, balance, endurance for safe ADLs and gait.  Orthostatics as follows: lying 125/51, sitting 142/52, standing 128/44, after ambulation while seated in chair 116/56.    Follow Up Recommendations Home health PT;Supervision for mobility/OOB;Supervision - Intermittent    Equipment Recommendations  Rolling walker with 5" wheels    Recommendations for Other Services       Precautions / Restrictions Precautions Precautions: Fall Restrictions Weight Bearing Restrictions: No      Mobility  Bed Mobility Overal bed mobility: Needs Assistance Bed Mobility: Supine to Sit     Supine to sit: Supervision     General bed mobility comments: increased time, labored movement, c/o increased pain in feet/legs    Transfers Overall transfer level: Needs assistance Equipment used: None;Rolling walker (2 wheeled) Transfers: Stand  Pivot Transfers;Sit to/from Stand Sit to Stand: Min guard Stand pivot transfers: Min guard       General transfer comment: has to lean on nearby objects for support when not using AD, required use of RW for safety  Ambulation/Gait Ambulation/Gait assistance: Min guard;Min assist Gait Distance (Feet): 60 Feet Assistive device: Rolling walker (2 wheeled) Gait Pattern/deviations: Decreased step length - right;Decreased step length - left;Decreased stride  length Gait velocity: near normal   General Gait Details: unsteady labored movement with leaning on nearby objects for support when not using AD, required use of RW for safety demonstrating slightly labored cadence without loss of balance, limited mostly due to pain in feet  Stairs            Wheelchair Mobility    Modified Rankin (Stroke Patients Only)       Balance Overall balance assessment: Needs assistance Sitting-balance support: Feet supported;No upper extremity supported Sitting balance-Leahy Scale: Good Sitting balance - Comments: seated at EOB   Standing balance support: During functional activity;No upper extremity supported Standing balance-Leahy Scale: Poor Standing balance comment: fair/poor without AD, fair using RW                             Pertinent Vitals/Pain Pain Assessment: Faces Faces Pain Scale: Hurts even more Pain Location: BLE at wound sites Pain Descriptors / Indicators: Sore;Grimacing;Guarding Pain Intervention(s): Limited activity within patient's tolerance;Monitored during session;Repositioned    Home Living Family/patient expects to be discharged to:: Private residence Living Arrangements: Spouse/significant other Available Help at Discharge: Family Type of Home: House Home Access: Stairs to enter Entrance Stairs-Rails: Right;Left;Can reach both Entrance Stairs-Number of Steps: 5-6 Home Layout: Able to live on main level with bedroom/bathroom;Two level Home Equipment: None      Prior Function Level of Independence: Independent         Comments: household and short distanced community ambulator without AD     Hand Dominance        Extremity/Trunk Assessment   Upper Extremity Assessment Upper Extremity Assessment: Generalized weakness    Lower Extremity Assessment Lower Extremity Assessment: Generalized weakness    Cervical / Trunk Assessment Cervical / Trunk Assessment: Normal  Communication    Communication: HOH  Cognition Arousal/Alertness: Awake/alert Behavior During Therapy: WFL for tasks assessed/performed Overall Cognitive Status: Within Functional Limits for tasks assessed                                        General Comments      Exercises     Assessment/Plan    PT Assessment Patient needs continued PT services  PT Problem List Decreased strength;Decreased activity tolerance;Decreased balance;Decreased mobility       PT Treatment Interventions Balance training;DME instruction;Gait training;Stair training;Functional mobility training;Therapeutic activities;Therapeutic exercise;Patient/family education    PT Goals (Current goals can be found in the Care Plan section)  Acute Rehab PT Goals Patient Stated Goal: return home with family to assist PT Goal Formulation: With patient Time For Goal Achievement: 05/02/20 Potential to Achieve Goals: Good    Frequency Min 3X/week   Barriers to discharge        Co-evaluation               AM-PAC PT "6 Clicks" Mobility  Outcome Measure Help needed turning from your back to your side while in a flat  bed without using bedrails?: None Help needed moving from lying on your back to sitting on the side of a flat bed without using bedrails?: A Little Help needed moving to and from a bed to a chair (including a wheelchair)?: A Little Help needed standing up from a chair using your arms (e.g., wheelchair or bedside chair)?: A Little Help needed to walk in hospital room?: A Little Help needed climbing 3-5 steps with a railing? : Total 6 Click Score: 17    End of Session   Activity Tolerance: Patient tolerated treatment well;Patient limited by fatigue;Patient limited by pain Patient left: in chair;with call bell/phone within reach Nurse Communication: Mobility status PT Visit Diagnosis: Unsteadiness on feet (R26.81);Other abnormalities of gait and mobility (R26.89);Muscle weakness (generalized)  (M62.81)    Time: 7124-5809 PT Time Calculation (min) (ACUTE ONLY): 34 min   Charges:   PT Evaluation $PT Eval Moderate Complexity: 1 Mod PT Treatments $Therapeutic Activity: 23-37 mins        9:36 AM, 04/28/20 Lonell Grandchild, MPT Physical Therapist with [facility] [phone] office [phone] mobile phone

## 2020-04-28 NOTE — Progress Notes (Signed)
*  PRELIMINARY RESULTS* Echocardiogram 2D Echocardiogram has been performed.  Leavy Cella 04/28/2020, 10:31 AM

## 2020-04-29 ENCOUNTER — Inpatient Hospital Stay (HOSPITAL_COMMUNITY): Payer: Medicare Other

## 2020-04-29 LAB — CBC WITH DIFFERENTIAL/PLATELET
Abs Immature Granulocytes: 0.44 10*3/uL — ABNORMAL HIGH (ref 0.00–0.07)
Basophils Absolute: 0.1 10*3/uL (ref 0.0–0.1)
Basophils Relative: 0 %
Eosinophils Absolute: 0 10*3/uL (ref 0.0–0.5)
Eosinophils Relative: 0 %
HCT: 31 % — ABNORMAL LOW (ref 36.0–46.0)
Hemoglobin: 9.3 g/dL — ABNORMAL LOW (ref 12.0–15.0)
Immature Granulocytes: 2 %
Lymphocytes Relative: 9 %
Lymphs Abs: 2.1 10*3/uL (ref 0.7–4.0)
MCH: 30.3 pg (ref 26.0–34.0)
MCHC: 30 g/dL (ref 30.0–36.0)
MCV: 101 fL — ABNORMAL HIGH (ref 80.0–100.0)
Monocytes Absolute: 2.1 10*3/uL — ABNORMAL HIGH (ref 0.1–1.0)
Monocytes Relative: 9 %
Neutro Abs: 18.3 10*3/uL — ABNORMAL HIGH (ref 1.7–7.7)
Neutrophils Relative %: 80 %
Platelets: 287 10*3/uL (ref 150–400)
RBC: 3.07 MIL/uL — ABNORMAL LOW (ref 3.87–5.11)
RDW: 17.2 % — ABNORMAL HIGH (ref 11.5–15.5)
WBC: 23 10*3/uL — ABNORMAL HIGH (ref 4.0–10.5)
nRBC: 0 % (ref 0.0–0.2)

## 2020-04-29 LAB — BLOOD GAS, ARTERIAL
Acid-base deficit: 8.9 mmol/L — ABNORMAL HIGH (ref 0.0–2.0)
Bicarbonate: 17.5 mmol/L — ABNORMAL LOW (ref 20.0–28.0)
FIO2: 28
O2 Saturation: 97.4 %
Patient temperature: 37
pCO2 arterial: 28.1 mmHg — ABNORMAL LOW (ref 32.0–48.0)
pH, Arterial: 7.361 (ref 7.350–7.450)
pO2, Arterial: 101 mmHg (ref 83.0–108.0)

## 2020-04-29 LAB — COMPREHENSIVE METABOLIC PANEL
ALT: 18 U/L (ref 0–44)
AST: 35 U/L (ref 15–41)
Albumin: 1.8 g/dL — ABNORMAL LOW (ref 3.5–5.0)
Alkaline Phosphatase: 59 U/L (ref 38–126)
Anion gap: 11 (ref 5–15)
BUN: 85 mg/dL — ABNORMAL HIGH (ref 8–23)
CO2: 15 mmol/L — ABNORMAL LOW (ref 22–32)
Calcium: 7.8 mg/dL — ABNORMAL LOW (ref 8.9–10.3)
Chloride: 113 mmol/L — ABNORMAL HIGH (ref 98–111)
Creatinine, Ser: 1.76 mg/dL — ABNORMAL HIGH (ref 0.44–1.00)
GFR, Estimated: 28 mL/min — ABNORMAL LOW (ref >60)
Glucose, Bld: 112 mg/dL — ABNORMAL HIGH (ref 70–99)
Potassium: 3.9 mmol/L (ref 3.5–5.1)
Sodium: 139 mmol/L (ref 135–145)
Total Bilirubin: 0.6 mg/dL (ref 0.3–1.2)
Total Protein: 5.6 g/dL — ABNORMAL LOW (ref 6.5–8.1)

## 2020-04-29 LAB — MAGNESIUM: Magnesium: 2.4 mg/dL (ref 1.7–2.4)

## 2020-04-29 LAB — PHOSPHORUS: Phosphorus: 5.1 mg/dL — ABNORMAL HIGH (ref 2.5–4.6)

## 2020-04-29 MED ORDER — SODIUM CHLORIDE 0.9 % IV BOLUS
500.0000 mL | Freq: Once | INTRAVENOUS | Status: AC
  Administered 2020-04-29: 02:00:00 500 mL via INTRAVENOUS

## 2020-04-29 MED ORDER — SODIUM CHLORIDE 0.9 % IV BOLUS
500.0000 mL | Freq: Once | INTRAVENOUS | Status: AC
  Administered 2020-04-29: 01:00:00 500 mL via INTRAVENOUS

## 2020-04-29 MED ORDER — SODIUM CHLORIDE 0.9 % IV SOLN: INTRAVENOUS | Status: AC

## 2020-04-29 NOTE — Plan of Care (Signed)

## 2020-04-29 NOTE — TOC Progression Note (Signed)
Transition of Care [facility]) - Progression Note   Patient Details  Name: [name] [name] MRN: [id number] Date of Birth: 1931/09/11  Transition of Care [facility] CM/SW [place], LCSW Phone Number: 04/29/2020, 3:43 PM  Clinical Narrative: CSW spoke with patient's husband regarding HHPT. Husband agreeable to referral. Referral made to [facility] with [facility]. HH added to AVS. TOC to follow.  Expected Discharge Plan: [place] Barriers to Discharge: Continued Medical Work up  Expected Discharge Plan and Services Expected Discharge Plan: [place] In-house Referral: Clinical Social Work Discharge Planning Services: NA Post Acute Care Choice: [place] Living arrangements for the past 2 months: [place]              DME Arranged: Walker rolling DME Agency: [place]: PT [place] Agency: [place] ([facility]) Date [place]: 04/29/20 Time [place]: 74 Representative spoke with at [place]: Corene Cornea  Readmission Risk Interventions No flowsheet data found.

## 2020-04-29 NOTE — Progress Notes (Signed)
MD notified of current BP 96/38 after second 500 ml bolus. Also notified of pt O2 sat going down 70-100. Charge nurse at bedside, will continue to monitor and wait for new orders

## 2020-04-29 NOTE — Progress Notes (Signed)
MD notified of MEWS 6, 500 ml bolus given, charge nurse notified, will continue to monitor and await new orders

## 2020-04-29 NOTE — Progress Notes (Signed)
PROGRESS NOTE    [name] [name]  ZYS:[id number] DOB: 1931/06/01 DOA: 04/27/2020 PCP: Asencion Noble, MD    Brief Narrative:  69 female with history of hypertension, coronary artery stenosis, venous stasis ulcers of lower extremities and recently worsening leg ulcers presented to the ER with pain, leg wounds, episodes of less responsiveness that patient describes as painful conditions. Recently suffering from difficult to treat leg wounds, started on diuretics and going to outpatient wound center. At the emergency room, afebrile. Initial blood pressure 82/61. WC count 17.6. Creatinine 3.12, recent baseline from 6 years ago was normal. Patient was given IV fluid, pain medications and antibiotics and admitted. COVID-19 negative. Chest x-ray normal.   Assessment & Plan:   Principal Problem:   AKI (acute kidney injury) ([place]) Active Problems:   Syncope and collapse   Dehydration   Moderate protein-calorie malnutrition (HCC)   Cellulitis   HTN (hypertension)  Syncope and collapse with orthostasis, hypotension, moderate dehydration and acute kidney injury: Probably acute kidney injury due to combination of active infection, dehydration and diuretics. Patient also on ARB along with hydrochlorothiazide. Patient also uses NSAIDs. -Blood pressures already improving with IV fluids, renal functions improving and adequate, urine output is adequate. Hold all antihypertensives and diuretics. Will hydrate with IV fluids in the hospital. -Less likely cardiac cause, echocardiogram was normal.  Currently duplexes with known stenosis on the right ICA.  She does not have any focal deficits.  Bilateral lower extremity wounds/infected wounds with elevated white cell count: -Seen by wound care team. Local wound care. Because of elevated WBC count and weeping wounds, will treat with IV vancomycin today with plan to ultimately change to oral antibiotics for 10 to 14 days. -Patient is already establishing with outpatient  wound care services, she will definitely benefit with ongoing regular follow-up, may benefit with compression and Unna boot. -Seen by vascular surgery as outpatient, reportedly normal ABI.  Hypertension: Blood pressures low. Discontinue hydrochlorothiazide and losartan due to AKI. Will start patient on alternative blood pressure medications once blood pressure improved.   DVT prophylaxis: heparin injection 5,000 Units Start: 04/28/20 0600   Code Status: DNR Family Communication: Patient's husband at the bedside. Disposition Plan: Status is: Inpatient  Remains inpatient appropriate because:Inpatient level of care appropriate due to severity of illness   Dispo: The patient is from: Home              Anticipated d/c is to: Home              Anticipated d/c date is: 2 days              Patient currently is not medically stable to d/c.         Consultants:   Wound care team  Procedures:   None  Antimicrobials:  Antibiotics Given (last 72 hours)    Date/Time Action Medication Dose Rate   04/27/20 2218 New Bag/Given   vancomycin (VANCOREADY) IVPB 1250 mg/250 mL 1,250 mg 166.7 mL/hr         Subjective: Patient seen and examined.  Overnight events noted.  Patient had high fever, tachycardia and hypoxia that resolved with bolus IV fluids.  Husband at the bedside. Both are emotional and disappointed because they have to stay in the hospital.  Objective: Vitals:   04/29/20 0530 04/29/20 0729 04/29/20 0859 04/29/20 1000  BP: (!) 113/45 (!) 106/57    Pulse: (!) 104     Resp:      Temp:   99.1 F (  37.3 C)   TempSrc:   Oral   SpO2: 92%   (!) 85%  Weight: 62.2 kg     Height:        Intake/Output Summary (Last 24 hours) at 04/29/2020 1120 Last data filed at 04/29/2020 0611 Gross per 24 hour  Intake 2480.11 ml  Output 400 ml  Net 2080.11 ml   Filed Weights   04/27/20 1507 04/29/20 0530  Weight: 61.2 kg 62.2 kg    Examination:  General exam: Appears calm and  comfortable, currently on room air. Hard of hearing. Has some discomfort on her legs on mobility. Respiratory system: Clear to auscultation. Respiratory effort normal. No added sounds. Cardiovascular system: S1 & S2 heard, RRR. No JVD, murmurs, rubs, gallops or clicks.  Gastrointestinal system: Abdomen is nondistended, soft and nontender. No organomegaly or masses felt. Normal bowel sounds heard. Central nervous system: Alert and oriented. No focal neurological deficits. Extremities: Symmetric 5 x 5 power. Skin:  Bilateral leg has extensive wounds, yellow slough to the wound bed with moderate serosanguineous weeping. Some edema and chronic skin changes of the legs.    Data Reviewed: I have personally reviewed following labs and imaging studies  CBC: Recent Labs  Lab 04/27/20 1517 04/28/20 0543 04/29/20 0501  WBC 17.6* 21.5* 23.0*  NEUTROABS  --  17.4* 18.3*  HGB 10.5* 10.7* 9.3*  HCT 33.6* 33.7* 31.0*  MCV 96.0 96.0 101.0*  PLT 364 403* 295   Basic Metabolic Panel: Recent Labs  Lab 04/27/20 1517 04/28/20 0543 04/29/20 0501  NA 135 136 139  K 4.2 4.1 3.9  CL 104 104 113*  CO2 17* 19* 15*  GLUCOSE 114* 135* 112*  BUN 90* 86* 85*  CREATININE 3.12* 2.34* 1.76*  CALCIUM 7.8* 7.8* 7.8*  MG  --  2.6* 2.4  PHOS  --   --  5.1*   GFR: Estimated Creatinine Clearance: 19 mL/min (A) (by C-G formula based on SCr of 1.76 mg/dL (H)). Liver Function Tests: Recent Labs  Lab 04/27/20 1730 04/28/20 0543 04/29/20 0501  AST 21 39 35  ALT 16 21 18   ALKPHOS 75 74 59  BILITOT 0.5 0.5 0.6  PROT 6.8 6.5 5.6*  ALBUMIN 2.2* 2.1* 1.8*   No results for input(s): LIPASE, AMYLASE in the last 168 hours. No results for input(s): AMMONIA in the last 168 hours. Coagulation Profile: No results for input(s): INR, PROTIME in the last 168 hours. Cardiac Enzymes: No results for input(s): CKTOTAL, CKMB, CKMBINDEX, TROPONINI in the last 168 hours. BNP (last 3 results) No results for input(s):  PROBNP in the last 8760 hours. HbA1C: No results for input(s): HGBA1C in the last 72 hours. CBG: Recent Labs  Lab 04/27/20 1512 04/27/20 1732  GLUCAP 66* 91   Lipid Profile: No results for input(s): CHOL, HDL, LDLCALC, TRIG, CHOLHDL, LDLDIRECT in the last 72 hours. Thyroid Function Tests: No results for input(s): TSH, T4TOTAL, FREET4, T3FREE, THYROIDAB in the last 72 hours. Anemia Panel: No results for input(s): VITAMINB12, FOLATE, FERRITIN, TIBC, IRON, RETICCTPCT in the last 72 hours. Sepsis Labs: No results for input(s): PROCALCITON, LATICACIDVEN in the last 168 hours.  Recent Results (from the past 240 hour(s))  Resp Panel by RT-PCR (Flu A&B, Covid) Nasopharyngeal Swab     Status: None   Collection Time: 04/27/20  7:00 PM   Specimen: Nasopharyngeal Swab; Nasopharyngeal(NP) swabs in vial transport medium  Result Value Ref Range Status   SARS Coronavirus 2 by RT PCR NEGATIVE NEGATIVE Final  Comment: (NOTE) SARS-CoV-2 target nucleic acids are NOT DETECTED.  The SARS-CoV-2 RNA is generally detectable in upper respiratory specimens during the acute phase of infection. The lowest concentration of SARS-CoV-2 viral copies this assay can detect is 138 copies/mL. A negative result does not preclude SARS-Cov-2 infection and should not be used as the sole basis for treatment or other patient management decisions. A negative result may occur with  improper specimen collection/handling, submission of specimen other than nasopharyngeal swab, presence of viral mutation(s) within the areas targeted by this assay, and inadequate number of viral copies(<138 copies/mL). A negative result must be combined with clinical observations, patient history, and epidemiological information. The expected result is Negative.  Fact Sheet for Patients:  EntrepreneurPulse.com.au  Fact Sheet for Healthcare Providers:  IncredibleEmployment.be  This test is no t yet  approved or cleared by the [place] FDA and  has been authorized for detection and/or diagnosis of SARS-CoV-2 by FDA under an Emergency Use Authorization (EUA). This EUA will remain  in effect (meaning this test can be used) for the duration of the COVID-19 declaration under Section 564(b)(1) of the Act, 21 U.S.C.section 360bbb-3(b)(1), unless the authorization is terminated  or revoked sooner.       Influenza A by PCR NEGATIVE NEGATIVE Final   Influenza B by PCR NEGATIVE NEGATIVE Final    Comment: (NOTE) The Xpert Xpress SARS-CoV-2/FLU/RSV plus assay is intended as an aid in the diagnosis of influenza from Nasopharyngeal swab specimens and should not be used as a sole basis for treatment. Nasal washings and aspirates are unacceptable for Xpert Xpress SARS-CoV-2/FLU/RSV testing.  Fact Sheet for Patients: EntrepreneurPulse.com.au  Fact Sheet for Healthcare Providers: IncredibleEmployment.be  This test is not yet approved or cleared by the [place] FDA and has been authorized for detection and/or diagnosis of SARS-CoV-2 by FDA under an Emergency Use Authorization (EUA). This EUA will remain in effect (meaning this test can be used) for the duration of the COVID-19 declaration under Section 564(b)(1) of the Act, 21 U.S.C. section 360bbb-3(b)(1), unless the authorization is terminated or revoked.  Performed at [facility], [address], [place], [place] [address]   Blood Cultures x 2 sites     Status: None (Preliminary result)   Collection Time: 04/28/20  5:43 AM   Specimen: Left Antecubital; Blood  Result Value Ref Range Status   Specimen Description LEFT ANTECUBITAL  Final   Special Requests   Final    Blood Culture adequate volume BOTTLES DRAWN AEROBIC AND ANAEROBIC   Culture   Final    NO GROWTH 1 DAY Performed at [facility], [address] [place], [place] [address]    Report Status PENDING  Incomplete  Blood Cultures x  2 sites     Status: None (Preliminary result)   Collection Time: 04/28/20  5:43 AM   Specimen: BLOOD RIGHT HAND  Result Value Ref Range Status   Specimen Description BLOOD RIGHT HAND  Final   Special Requests   Final    BOTTLES DRAWN AEROBIC AND ANAEROBIC Blood Culture adequate volume   Culture   Final    NO GROWTH 1 DAY Performed at [facility], [address] [place], [place] [address]    Report Status PENDING  Incomplete         Radiology Studies: CT HEAD WO CONTRAST  Result Date: 04/28/2020 CLINICAL DATA:  Syncopal episode yesterday. Unwitnessed fall. Fell backwards. Patient is unsure if she hit her head. EXAM: CT HEAD WITHOUT CONTRAST TECHNIQUE: Contiguous  axial images were obtained from the base of the skull through the vertex without intravenous contrast. COMPARISON:  None. FINDINGS: Brain: No acute infarct, hemorrhage, or mass lesion is present. No significant white matter lesions are present. The ventricles are of normal size. No significant extraaxial fluid collection is present. The brainstem and cerebellum are within normal limits. Vascular: Atherosclerotic calcifications are present within the cavernous internal carotid arteries bilaterally. No hyperdense vessel is present. Skull: Calvarium is intact. No focal lytic or blastic lesions are present. No significant soft tissue injury is present. Sinuses/Orbits: The paranasal sinuses and mastoid air cells are clear. Bilateral lens replacements are noted. Globes and orbits are otherwise unremarkable. IMPRESSION: Normal CT appearance of the head for age.  No acute trauma. Electronically Signed   By: San Morelle M.D.   On: 04/28/2020 11:18   US Carotid Bilateral  Result Date: 04/28/2020 CLINICAL DATA:  Syncope. EXAM: BILATERAL CAROTID DUPLEX ULTRASOUND TECHNIQUE: Pearline Cables scale imaging, color Doppler and duplex ultrasound were performed of bilateral carotid and vertebral arteries in the neck. COMPARISON:  01/23/2017 FINDINGS:  Criteria: Quantification of carotid stenosis is based on velocity parameters that correlate the residual internal carotid diameter with NASCET-based stenosis levels, using the diameter of the distal internal carotid lumen as the denominator for stenosis measurement. The following velocity measurements were obtained: RIGHT ICA: 144/34 cm/sec CCA: 983/38 cm/sec SYSTOLIC ICA/CCA RATIO:  1.3 ECA: 126 cm/sec LEFT ICA: 127/36 cm/sec CCA: 25/05 cm/sec SYSTOLIC ICA/CCA RATIO:  1.5 ECA: 89 cm/sec RIGHT CAROTID ARTERY: Heterogeneous plaque at the right carotid bulb. Peak systolic velocity at the right carotid bulb measures 136 cm/sec and previously measured 217 cm/sec. External carotid artery is patent with normal waveform. Peak systolic velocity in the right internal carotid artery measures 144 cm/sec and previously measured 233 cm/sec. RIGHT VERTEBRAL ARTERY: Antegrade flow and normal waveform in the right vertebral artery. LEFT CAROTID ARTERY: Shadowing plaque at the left carotid bulb. Heterogeneous plaque at the origin of the external carotid artery. External carotid artery is patent with normal waveform.Distal left internal carotid artery is very tortuous. Left internal carotid artery peak systolic velocity is minimally elevated measuring up to 127 cm/sec. Previously the left internal carotid artery peak systolic velocity measuring 239 cm/sec. LEFT VERTEBRAL ARTERY: Antegrade flow and normal waveform in the left vertebral artery. IMPRESSION: 1. Atherosclerotic disease involving the bilateral carotid arteries. Peak systolic velocities have significantly decreased since the exam in 2018. 2. Estimated degree of stenosis in the right internal carotid artery is 50-69%. 3. Peak systolic velocity in the left internal carotid artery is minimally elevated. This elevation could be related to tortuosity. Cannot exclude stenosis measuring 50-69% in the left internal carotid artery. 4. Patent vertebral arteries with antegrade flow.  Electronically Signed   By: Markus Daft M.D.   On: 04/28/2020 11:52   DG CHEST PORT 1 VIEW  Result Date: 04/29/2020 CLINICAL DATA:  Hypoxia. EXAM: PORTABLE CHEST 1 VIEW COMPARISON:  April 27, 2020 FINDINGS: The heart size is enlarged. Aortic calcifications are noted. There is blunting at the left costophrenic angle which may represent a combination of a small left-sided pleural effusion and atelectasis. There is no pneumothorax. There is no acute osseous abnormality. IMPRESSION: 1. Cardiomegaly. 2. Blunting at the left costophrenic angle which may represent a combination of a small left-sided pleural effusion and atelectasis. Electronically Signed   By: Constance Holster M.D.   On: 04/29/2020 01:40   DG Chest Portable 1 View  Result Date: 04/27/2020 CLINICAL DATA:  weakness  EXAM: PORTABLE CHEST 1 VIEW COMPARISON:  08/26/2013. FINDINGS: No focal consolidation. No pneumothorax or pleural effusion. Stable cardiomediastinal silhouette. Multilevel spondylosis. IMPRESSION: No focal airspace disease. Electronically Signed   By: Primitivo Gauze M.D.   On: 04/27/2020 17:33   ECHOCARDIOGRAM COMPLETE  Result Date: 04/28/2020    ECHOCARDIOGRAM REPORT   Patient Name:   KALINA Canto Date of Exam: 04/28/2020 Medical Rec #:  [id number]  Height:       63.0 in Accession #:    [id number] Weight:       135.0 lb Date of Birth:  10/22/1931 BSA:          1.636 m Patient Age:    86 years   BP:           130/54 mmHg Patient Gender: F          HR:           118 bpm. Exam Location:  [facility] Procedure: 2D Echo Indications:    Syncope 780.2 / R55  History:        Patient has no prior history of Echocardiogram examinations.                 Signs/Symptoms:Syncope; Risk Factors:Former Smoker and                 Hypertension. Colon Cancer, Anemia.  Sonographer:    Leavy Cella RDCS (AE) Referring Phys: [id number] ASIA B [place]  1. Left ventricular ejection fraction, by estimation, is >75%. The left ventricle  has hyperdynamic function. The left ventricle has no regional wall motion abnormalities. There is moderate left ventricular hypertrophy. Left ventricular diastolic parameters are consistent with Grade I diastolic dysfunction (impaired relaxation). Elevated left atrial pressure.  2. Right ventricular systolic function is normal. The right ventricular size is normal.  3. The mitral valve is normal in structure. No evidence of mitral valve regurgitation. No evidence of mitral stenosis. Moderate mitral annular calcification.  4. The aortic valve has an indeterminant number of cusps. There is moderate calcification of the aortic valve. There is moderate thickening of the aortic valve. Aortic valve regurgitation is not visualized. No aortic stenosis is present.  5. The inferior vena cava is normal in size with greater than 50% respiratory variability, suggesting right atrial pressure of 3 mmHg. FINDINGS  Left Ventricle: Left ventricular ejection fraction, by estimation, is >75%. The left ventricle has hyperdynamic function. The left ventricle has no regional wall motion abnormalities. The left ventricular internal cavity size was normal in size. There is moderate left ventricular hypertrophy. Left ventricular diastolic parameters are consistent with Grade I diastolic dysfunction (impaired relaxation). Elevated left atrial pressure. Right Ventricle: The right ventricular size is normal. No increase in right ventricular wall thickness. Right ventricular systolic function is normal. Left Atrium: Left atrial size was normal in size. Right Atrium: Right atrial size was normal in size. Pericardium: There is no evidence of pericardial effusion. Mitral Valve: The mitral valve is normal in structure. There is moderate thickening of the mitral valve leaflet(s). There is moderate calcification of the mitral valve leaflet(s). Moderate mitral annular calcification. No evidence of mitral valve regurgitation. No evidence of mitral valve  stenosis. Tricuspid Valve: The tricuspid valve is normal in structure. Tricuspid valve regurgitation is mild . No evidence of tricuspid stenosis. Aortic Valve: The aortic valve has an indeterminant number of cusps. There is moderate calcification of the aortic valve. There is moderate thickening of the aortic valve. There is moderate aortic  valve annular calcification. Aortic valve regurgitation is not visualized. No aortic stenosis is present. Aortic valve mean gradient measures 7.9 mmHg. Aortic valve peak gradient measures 17.2 mmHg. Aortic valve area, by VTI measures 2.76 cm. Pulmonic Valve: The pulmonic valve was not well visualized. Pulmonic valve regurgitation is not visualized. No evidence of pulmonic stenosis. Aorta: The aortic root is normal in size and structure. Pulmonary Artery: 30. Venous: The inferior vena cava is normal in size with greater than 50% respiratory variability, suggesting right atrial pressure of 3 mmHg. IAS/Shunts: No atrial level shunt detected by color flow Doppler.  LEFT VENTRICLE PLAX 2D LVIDd:         3.82 cm  Diastology LVIDs:         1.93 cm  LV e' medial:    5.55 cm/s LV PW:         1.30 cm  LV E/e' medial:  16.6 LV IVS:        1.24 cm  LV e' lateral:   6.53 cm/s LVOT diam:     1.80 cm  LV E/e' lateral: 14.1 LV SV:         82 LV SV Index:   50 LVOT Area:     2.54 cm  RIGHT VENTRICLE RV S prime:     18.60 cm/s LEFT ATRIUM             Index       RIGHT ATRIUM          Index LA diam:        2.90 cm 1.77 cm/m  RA Area:     5.80 cm LA Vol (A2C):   29.9 ml 18.27 ml/m RA Volume:   7.03 ml  4.30 ml/m LA Vol (A4C):   40.0 ml 24.45 ml/m LA Biplane Vol: 34.7 ml 21.21 ml/m  AORTIC VALVE AV Area (Vmax):    2.52 cm AV Area (Vmean):   2.31 cm AV Area (VTI):     2.76 cm AV Vmax:           207.16 cm/s AV Vmean:          129.255 cm/s AV VTI:            0.297 m AV Peak Grad:      17.2 mmHg AV Mean Grad:      7.9 mmHg LVOT Vmax:         204.83 cm/s LVOT Vmean:        117.376 cm/s LVOT  VTI:          0.322 m LVOT/AV VTI ratio: 1.09  AORTA Ao Root diam: 3.00 cm MITRAL VALVE                TRICUSPID VALVE MV Area (PHT): 2.08 cm     TR Peak grad:   29.8 mmHg MV Decel Time: 364 msec     TR Vmax:        273.00 cm/s MV E velocity: 92.00 cm/s MV A velocity: 135.00 cm/s  SHUNTS MV E/A ratio:  0.68         Systemic VTI:  0.32 m                             Systemic Diam: 1.80 cm Carlyle Dolly MD Electronically signed by Carlyle Dolly MD Signature Date/Time: 04/28/2020/11:31:03 AM    Final         Scheduled Meds: . aspirin EC  81 mg Oral Daily  . atorvastatin  20 mg Oral Daily  . collagenase   Topical Daily  . feeding supplement  237 mL Oral BID BM  . gabapentin  300 mg Oral BID  . heparin  5,000 Units Subcutaneous Q8H  . multivitamin with minerals  1 tablet Oral Daily  . nutrition supplement (JUVEN)  1 packet Oral BID BM  . silver sulfADIAZINE  1 application Topical Daily   Continuous Infusions: . sodium chloride 150 mL/hr at 04/29/20 0611  . vancomycin       LOS: 2 days    Time spent: 30 minutes     Barb Merino, MD [facility] Pager [phone]

## 2020-04-30 LAB — CBC WITH DIFFERENTIAL/PLATELET
Abs Immature Granulocytes: 0.69 10*3/uL — ABNORMAL HIGH (ref 0.00–0.07)
Basophils Absolute: 0.1 10*3/uL (ref 0.0–0.1)
Basophils Relative: 0 %
Eosinophils Absolute: 0 10*3/uL (ref 0.0–0.5)
Eosinophils Relative: 0 %
HCT: 29.7 % — ABNORMAL LOW (ref 36.0–46.0)
Hemoglobin: 9.1 g/dL — ABNORMAL LOW (ref 12.0–15.0)
Immature Granulocytes: 2 %
Lymphocytes Relative: 5 %
Lymphs Abs: 1.7 10*3/uL (ref 0.7–4.0)
MCH: 30.1 pg (ref 26.0–34.0)
MCHC: 30.6 g/dL (ref 30.0–36.0)
MCV: 98.3 fL (ref 80.0–100.0)
Monocytes Absolute: 2 10*3/uL — ABNORMAL HIGH (ref 0.1–1.0)
Monocytes Relative: 7 %
Neutro Abs: 26.6 10*3/uL — ABNORMAL HIGH (ref 1.7–7.7)
Neutrophils Relative %: 86 %
Platelets: 299 10*3/uL (ref 150–400)
RBC: 3.02 MIL/uL — ABNORMAL LOW (ref 3.87–5.11)
RDW: 17.2 % — ABNORMAL HIGH (ref 11.5–15.5)
WBC: 31 10*3/uL — ABNORMAL HIGH (ref 4.0–10.5)
nRBC: 0.1 % (ref 0.0–0.2)

## 2020-04-30 LAB — COMPREHENSIVE METABOLIC PANEL
ALT: 17 U/L (ref 0–44)
AST: 27 U/L (ref 15–41)
Albumin: 1.7 g/dL — ABNORMAL LOW (ref 3.5–5.0)
Alkaline Phosphatase: 66 U/L (ref 38–126)
Anion gap: 9 (ref 5–15)
BUN: 71 mg/dL — ABNORMAL HIGH (ref 8–23)
CO2: 17 mmol/L — ABNORMAL LOW (ref 22–32)
Calcium: 7.9 mg/dL — ABNORMAL LOW (ref 8.9–10.3)
Chloride: 110 mmol/L (ref 98–111)
Creatinine, Ser: 1.92 mg/dL — ABNORMAL HIGH (ref 0.44–1.00)
GFR, Estimated: 25 mL/min — ABNORMAL LOW (ref >60)
Glucose, Bld: 118 mg/dL — ABNORMAL HIGH (ref 70–99)
Potassium: 3.6 mmol/L (ref 3.5–5.1)
Sodium: 136 mmol/L (ref 135–145)
Total Bilirubin: 0.6 mg/dL (ref 0.3–1.2)
Total Protein: 6.1 g/dL — ABNORMAL LOW (ref 6.5–8.1)

## 2020-04-30 MED ORDER — SODIUM CHLORIDE 0.9 % IV SOLN: INTRAVENOUS | Status: DC

## 2020-04-30 MED ORDER — PIPERACILLIN-TAZOBACTAM 3.375 G IVPB
3.3750 g | Freq: Two times a day (BID) | INTRAVENOUS | Status: DC
  Administered 2020-04-30 – 2020-05-04 (×10): 3.375 g via INTRAVENOUS
  Filled 2020-04-30 (×11): qty 50

## 2020-04-30 NOTE — Progress Notes (Signed)
PROGRESS NOTE    [name] [name]  YSA:[id number] DOB: 1931-08-28 DOA: 04/27/2020 PCP: Asencion Noble, MD    Brief Narrative:  42 female with history of hypertension, coronary artery stenosis, venous stasis ulcers of lower extremities and recently worsening leg ulcers presented to the ER with pain, leg wounds, episodes of less responsiveness that patient describes as painful conditions. Recently suffering from difficult to treat leg wounds, started on diuretics and going to outpatient wound center. At the emergency room, afebrile. Initial blood pressure 82/61. WC count 17.6. Creatinine 3.12, recent baseline from 6 years ago was normal. Patient was given IV fluid, pain medications and antibiotics and admitted. COVID-19 negative. Chest x-ray normal.   Assessment & Plan:   Principal Problem:   AKI (acute kidney injury) ([place]) Active Problems:   Syncope and collapse   Dehydration   Moderate protein-calorie malnutrition (HCC)   Cellulitis   HTN (hypertension)  Syncope and collapse with orthostasis, hypotension, moderate dehydration and acute kidney injury: Probably acute kidney injury due to combination of active infection, dehydration and diuretics. Patient also on ARB along with hydrochlorothiazide. Patient also uses NSAIDs. -Blood pressures already improving with IV fluids, renal functions improving and adequate, urine output is adequate. Hold all antihypertensives and diuretics. Will hydrate with IV fluids in the hospital. -Less likely cardiac cause, echocardiogram was normal.  Currently duplexes with known stenosis on the right ICA.  She does not have any focal deficits. -Restart IV fluids today.  She is not taking adequate oral intake.  Bilateral lower extremity wounds/infected wounds with elevated white cell count: -Seen by wound care team. Local wound care. -Patient has worsening leukocytosis today, had episodes of fever overnight.  Will broaden antibiotic to vancomycin and Zosyn.  Clinically  there is no evidence of underlying abscess or osteomyelitis.  If no adequate improvement by next 24 hours, will do imaging studies. -Patient is already establishing with outpatient wound care services, she will definitely benefit with ongoing regular follow-up, may benefit with compression and Unna boot. -Seen by vascular surgery as outpatient, reportedly normal ABI.  Hypertension: Blood pressures low. Discontinue hydrochlorothiazide and losartan due to AKI. Will start patient on alternative blood pressure medications once blood pressure improved.   DVT prophylaxis: heparin injection 5,000 Units Start: 04/28/20 0600   Code Status: DNR Family Communication: Patient's husband at the bedside. Disposition Plan: Status is: Inpatient  Remains inpatient appropriate because:Inpatient level of care appropriate due to severity of illness   Dispo: The patient is from: Home              Anticipated d/c is to: Home              Anticipated d/c date is: 2 days              Patient currently is not medically stable to d/c.         Consultants:   Wound care team  Procedures:   None  Antimicrobials:  Antibiotics Given (last 72 hours)    Date/Time Action Medication Dose Rate   04/27/20 2218 New Bag/Given   vancomycin (VANCOREADY) IVPB 1250 mg/250 mL 1,250 mg 166.7 mL/hr   04/29/20 2225 New Bag/Given   vancomycin (VANCOREADY) IVPB 750 mg/150 mL 750 mg 150 mL/hr   04/30/20 1055 New Bag/Given   piperacillin-tazobactam (ZOSYN) IVPB 3.375 g 3.375 g 12.5 mL/hr         Subjective: Patient seen and examined.  Overnight no events.  Legs keep hurting.  Wants to go home. Had  low-grade temperature 100.4 last 24 hours.  Denies any nausea vomiting.  Husband at the bedside.  Objective: Vitals:   04/29/20 1800 04/29/20 1928 04/29/20 2113 04/30/20 0336  BP:   (!) 112/50 (!) 123/55  Pulse:  (!) 112 (!) 103 62  Resp:   17 18  Temp:  98.5 F (36.9 C) 98.5 F (36.9 C) 98.5 F (36.9 C)   TempSrc:  Oral Oral   SpO2: 95% 99% 98% 100%  Weight:    66.3 kg  Height:        Intake/Output Summary (Last 24 hours) at 04/30/2020 1125 Last data filed at 04/30/2020 0356 Gross per 24 hour  Intake 1214.16 ml  Output 200 ml  Net 1014.16 ml   Filed Weights   04/27/20 1507 04/29/20 0530 04/30/20 0336  Weight: 61.2 kg 62.2 kg 66.3 kg    Examination:  General exam: Appears calm and comfortable,  Hard of hearing. Has some discomfort on her legs on mobility. Respiratory system: Clear to auscultation. Respiratory effort normal. No added sounds. Cardiovascular system: S1 & S2 heard, RRR. No JVD, murmurs, rubs, gallops or clicks.  Gastrointestinal system: Abdomen is nondistended, soft and nontender. No organomegaly or masses felt. Normal bowel sounds heard. Central nervous system: Alert and oriented. No focal neurological deficits. Extremities: Symmetric 5 x 5 power. Skin:  Bilateral leg has extensive wounds, yellow slough to the wound bed with moderate serosanguineous weeping. Some edema and chronic skin changes of the legs. Has areas of punched out skin lesions.    Data Reviewed: I have personally reviewed following labs and imaging studies  CBC: Recent Labs  Lab 04/27/20 1517 04/28/20 0543 04/29/20 0501 04/30/20 0647  WBC 17.6* 21.5* 23.0* 31.0*  NEUTROABS  --  17.4* 18.3* 26.6*  HGB 10.5* 10.7* 9.3* 9.1*  HCT 33.6* 33.7* 31.0* 29.7*  MCV 96.0 96.0 101.0* 98.3  PLT 364 403* 287 789   Basic Metabolic Panel: Recent Labs  Lab 04/27/20 1517 04/28/20 0543 04/29/20 0501 04/30/20 0647  NA 135 136 139 136  K 4.2 4.1 3.9 3.6  CL 104 104 113* 110  CO2 17* 19* 15* 17*  GLUCOSE 114* 135* 112* 118*  BUN 90* 86* 85* 71*  CREATININE 3.12* 2.34* 1.76* 1.92*  CALCIUM 7.8* 7.8* 7.8* 7.9*  MG  --  2.6* 2.4  --   PHOS  --   --  5.1*  --    GFR: Estimated Creatinine Clearance: 19.3 mL/min (A) (by C-G formula based on SCr of 1.92 mg/dL (H)). Liver Function Tests: Recent  Labs  Lab 04/27/20 1730 04/28/20 0543 04/29/20 0501 04/30/20 0647  AST 21 39 35 27  ALT 16 21 18 17   ALKPHOS 75 74 59 66  BILITOT 0.5 0.5 0.6 0.6  PROT 6.8 6.5 5.6* 6.1*  ALBUMIN 2.2* 2.1* 1.8* 1.7*   No results for input(s): LIPASE, AMYLASE in the last 168 hours. No results for input(s): AMMONIA in the last 168 hours. Coagulation Profile: No results for input(s): INR, PROTIME in the last 168 hours. Cardiac Enzymes: No results for input(s): CKTOTAL, CKMB, CKMBINDEX, TROPONINI in the last 168 hours. BNP (last 3 results) No results for input(s): PROBNP in the last 8760 hours. HbA1C: No results for input(s): HGBA1C in the last 72 hours. CBG: Recent Labs  Lab 04/27/20 1512 04/27/20 1732  GLUCAP 66* 91   Lipid Profile: No results for input(s): CHOL, HDL, LDLCALC, TRIG, CHOLHDL, LDLDIRECT in the last 72 hours. Thyroid Function Tests: No results for input(s): TSH,  T4TOTAL, FREET4, T3FREE, THYROIDAB in the last 72 hours. Anemia Panel: No results for input(s): VITAMINB12, FOLATE, FERRITIN, TIBC, IRON, RETICCTPCT in the last 72 hours. Sepsis Labs: No results for input(s): PROCALCITON, LATICACIDVEN in the last 168 hours.  Recent Results (from the past 240 hour(s))  Resp Panel by RT-PCR (Flu A&B, Covid) Nasopharyngeal Swab     Status: None   Collection Time: 04/27/20  7:00 PM   Specimen: Nasopharyngeal Swab; Nasopharyngeal(NP) swabs in vial transport medium  Result Value Ref Range Status   SARS Coronavirus 2 by RT PCR NEGATIVE NEGATIVE Final    Comment: (NOTE) SARS-CoV-2 target nucleic acids are NOT DETECTED.  The SARS-CoV-2 RNA is generally detectable in upper respiratory specimens during the acute phase of infection. The lowest concentration of SARS-CoV-2 viral copies this assay can detect is 138 copies/mL. A negative result does not preclude SARS-Cov-2 infection and should not be used as the sole basis for treatment or other patient management decisions. A negative result  may occur with  improper specimen collection/handling, submission of specimen other than nasopharyngeal swab, presence of viral mutation(s) within the areas targeted by this assay, and inadequate number of viral copies(<138 copies/mL). A negative result must be combined with clinical observations, patient history, and epidemiological information. The expected result is Negative.  Fact Sheet for Patients:  EntrepreneurPulse.com.au  Fact Sheet for Healthcare Providers:  IncredibleEmployment.be  This test is no t yet approved or cleared by the [place] FDA and  has been authorized for detection and/or diagnosis of SARS-CoV-2 by FDA under an Emergency Use Authorization (EUA). This EUA will remain  in effect (meaning this test can be used) for the duration of the COVID-19 declaration under Section 564(b)(1) of the Act, 21 U.S.C.section 360bbb-3(b)(1), unless the authorization is terminated  or revoked sooner.       Influenza A by PCR NEGATIVE NEGATIVE Final   Influenza B by PCR NEGATIVE NEGATIVE Final    Comment: (NOTE) The Xpert Xpress SARS-CoV-2/FLU/RSV plus assay is intended as an aid in the diagnosis of influenza from Nasopharyngeal swab specimens and should not be used as a sole basis for treatment. Nasal washings and aspirates are unacceptable for Xpert Xpress SARS-CoV-2/FLU/RSV testing.  Fact Sheet for Patients: EntrepreneurPulse.com.au  Fact Sheet for Healthcare Providers: IncredibleEmployment.be  This test is not yet approved or cleared by the [place] FDA and has been authorized for detection and/or diagnosis of SARS-CoV-2 by FDA under an Emergency Use Authorization (EUA). This EUA will remain in effect (meaning this test can be used) for the duration of the COVID-19 declaration under Section 564(b)(1) of the Act, 21 U.S.C. section 360bbb-3(b)(1), unless the authorization is terminated  or revoked.  Performed at [facility], [address], [place], [place] [address]   Blood Cultures x 2 sites     Status: None (Preliminary result)   Collection Time: 04/28/20  5:43 AM   Specimen: Left Antecubital; Blood  Result Value Ref Range Status   Specimen Description LEFT ANTECUBITAL  Final   Special Requests   Final    Blood Culture adequate volume BOTTLES DRAWN AEROBIC AND ANAEROBIC   Culture   Final    NO GROWTH 2 DAYS Performed at [facility], [address], [place], [place] [address]    Report Status PENDING  Incomplete  Blood Cultures x 2 sites     Status: None (Preliminary result)   Collection Time: 04/28/20  5:43 AM   Specimen: BLOOD RIGHT HAND  Result Value Ref Range Status  Specimen Description BLOOD RIGHT HAND  Final   Special Requests   Final    BOTTLES DRAWN AEROBIC AND ANAEROBIC Blood Culture adequate volume   Culture   Final    NO GROWTH 2 DAYS Performed at [facility], [address] [place], [place] [address]    Report Status PENDING  Incomplete         Radiology Studies: DG CHEST PORT 1 VIEW  Result Date: 04/29/2020 CLINICAL DATA:  Hypoxia. EXAM: PORTABLE CHEST 1 VIEW COMPARISON:  April 27, 2020 FINDINGS: The heart size is enlarged. Aortic calcifications are noted. There is blunting at the left costophrenic angle which may represent a combination of a small left-sided pleural effusion and atelectasis. There is no pneumothorax. There is no acute osseous abnormality. IMPRESSION: 1. Cardiomegaly. 2. Blunting at the left costophrenic angle which may represent a combination of a small left-sided pleural effusion and atelectasis. Electronically Signed   By: Constance Holster M.D.   On: 04/29/2020 01:40        Scheduled Meds: . aspirin EC  81 mg Oral Daily  . atorvastatin  20 mg Oral Daily  . collagenase   Topical Daily  . feeding supplement  237 mL Oral BID BM  . gabapentin  300 mg Oral BID  . heparin  5,000 Units Subcutaneous Q8H  .  multivitamin with minerals  1 tablet Oral Daily  . nutrition supplement (JUVEN)  1 packet Oral BID BM  . silver sulfADIAZINE  1 application Topical Daily   Continuous Infusions: . sodium chloride    . piperacillin-tazobactam (ZOSYN)  IV 3.375 g (04/30/20 1055)  . vancomycin 750 mg (04/29/20 2225)     LOS: 3 days    Time spent: 30 minutes     Barb Merino, MD [facility] Pager [phone]

## 2020-04-30 NOTE — Progress Notes (Signed)
Pharmacy Antibiotic Note  [name] [name] is a 86 y.o. female admitted on 04/27/2020 with cellulitis.  Pharmacy has been consulted for Vancomycin and zosyn dosing. WBC increased today.  AKI, creatinine 3.12> 1.92, improving  Venous statis ulcers, noted with erythema and weeping wounds.  Plan:  Continue Vancomycin 750 mg IV q48h.  Target Vanc troughs 10-15 mcg/ml Zosyn 3.375gm IV q12h EID ove 4 hours  Will follow renal function and adjust regimen as indicated.  Follow clinical progress and cxs. [facility] at [facility]  Height: 5\' 3"  (160 cm) Weight: 66.3 kg (146 lb 2.6 oz) IBW/kg (Calculated) : 52.4  Temp (24hrs), Avg:98.5 F (36.9 C), Min:98.5 F (36.9 C), Max:98.5 F (36.9 C)  Recent Labs  Lab 04/27/20 1517 04/28/20 0543 04/29/20 0501 04/30/20 0647  WBC 17.6* 21.5* 23.0* 31.0*  CREATININE 3.12* 2.34* 1.76* 1.92*    Estimated Creatinine Clearance: 19.3 mL/min (A) (by C-G formula based on SCr of 1.92 mg/dL (H)).    No Known Allergies  Antimicrobials this admission:  Vancomycin 12/9 >> Zosyn 12/12>  Dose adjustments this admission:  n/a  Microbiology results: 12/10 BCX: ngtd  12/9 COVID and flu: negative  Thank you for allowing pharmacy to be a part of this patient's care.  Isac Sarna, BS Pharm D, BCPS Clinical Pharmacist 04/30/2020 9:55 AM

## 2020-04-30 NOTE — Plan of Care (Signed)

## 2020-05-01 LAB — CBC WITH DIFFERENTIAL/PLATELET
Abs Immature Granulocytes: 0.82 10*3/uL — ABNORMAL HIGH (ref 0.00–0.07)
Basophils Absolute: 0.1 10*3/uL (ref 0.0–0.1)
Basophils Relative: 0 %
Eosinophils Absolute: 0 10*3/uL (ref 0.0–0.5)
Eosinophils Relative: 0 %
HCT: 28.7 % — ABNORMAL LOW (ref 36.0–46.0)
Hemoglobin: 8.9 g/dL — ABNORMAL LOW (ref 12.0–15.0)
Immature Granulocytes: 3 %
Lymphocytes Relative: 6 %
Lymphs Abs: 1.5 10*3/uL (ref 0.7–4.0)
MCH: 30.5 pg (ref 26.0–34.0)
MCHC: 31 g/dL (ref 30.0–36.0)
MCV: 98.3 fL (ref 80.0–100.0)
Monocytes Absolute: 1.5 10*3/uL — ABNORMAL HIGH (ref 0.1–1.0)
Monocytes Relative: 6 %
Neutro Abs: 20.4 10*3/uL — ABNORMAL HIGH (ref 1.7–7.7)
Neutrophils Relative %: 85 %
Platelets: 302 10*3/uL (ref 150–400)
RBC: 2.92 MIL/uL — ABNORMAL LOW (ref 3.87–5.11)
RDW: 17.6 % — ABNORMAL HIGH (ref 11.5–15.5)
WBC: 24.4 10*3/uL — ABNORMAL HIGH (ref 4.0–10.5)
nRBC: 0.1 % (ref 0.0–0.2)

## 2020-05-01 LAB — COMPREHENSIVE METABOLIC PANEL
ALT: 15 U/L (ref 0–44)
AST: 20 U/L (ref 15–41)
Albumin: 1.6 g/dL — ABNORMAL LOW (ref 3.5–5.0)
Alkaline Phosphatase: 64 U/L (ref 38–126)
Anion gap: 10 (ref 5–15)
BUN: 77 mg/dL — ABNORMAL HIGH (ref 8–23)
CO2: 17 mmol/L — ABNORMAL LOW (ref 22–32)
Calcium: 8.2 mg/dL — ABNORMAL LOW (ref 8.9–10.3)
Chloride: 110 mmol/L (ref 98–111)
Creatinine, Ser: 2.23 mg/dL — ABNORMAL HIGH (ref 0.44–1.00)
GFR, Estimated: 21 mL/min — ABNORMAL LOW (ref >60)
Glucose, Bld: 102 mg/dL — ABNORMAL HIGH (ref 70–99)
Potassium: 3.8 mmol/L (ref 3.5–5.1)
Sodium: 137 mmol/L (ref 135–145)
Total Bilirubin: 0.7 mg/dL (ref 0.3–1.2)
Total Protein: 5.7 g/dL — ABNORMAL LOW (ref 6.5–8.1)

## 2020-05-01 NOTE — Progress Notes (Signed)
PROGRESS NOTE    [name] [name]  KTG:[id number] DOB: 06-23-1931 DOA: 04/27/2020 PCP: Asencion Noble, MD   Brief Narrative:  41 female with history of hypertension, coronary artery stenosis, venous stasis ulcers of lower extremities and recently worsening leg ulcers presented to the ER with pain, leg wounds, episodes of less responsiveness that patient describes as painful conditions. Recently suffering from difficult to treat leg wounds, started on diuretics and going to outpatient wound center. At the emergency room, afebrile. Initial blood pressure 82/61. WC count 17.6. Creatinine 3.12, recent baseline from 6 years ago was normal. Patient was given IV fluid, pain medications and antibiotics and admitted. COVID-19 negative. Chest x-ray normal. Admitted with significant infection of the both legs.   Assessment & Plan:   Principal Problem:   AKI (acute kidney injury) ([place]) Active Problems:   Syncope and collapse   Dehydration   Moderate protein-calorie malnutrition (HCC)   Cellulitis   HTN (hypertension)  Syncope and collapse with orthostasis, hypotension, moderate dehydration and acute kidney injury: acute kidney injury due to combination of active infection, dehydration and diuretics. Patient also on ARB along with hydrochlorothiazide. Patient also uses NSAIDs. -Blood pressures stabilized.  Hydrating with IV fluids.  Antihypertensives on hold.   -Less likely cardiac cause, echocardiogram was normal.  Currently duplexes with known stenosis on the right ICA.  She does not have any focal deficits.  Bilateral lower extremity wounds/infected wounds with elevated white cell count: -Seen by wound care team. Local wound care. -Patient has worsening leukocytosis and fever.  Antibiotics broadened to cover for Pseudomonas also.  Continue on vancomycin and Zosyn.   -Clinically some improvement today.  -Seen by vascular surgery as outpatient, reportedly normal ABI.  Hypertension: Blood pressures low  normal.  Diuretics discontinued due to renal function abnormality. At this time, she will not need any antihypertensives.   DVT prophylaxis: heparin injection 5,000 Units Start: 04/28/20 0600   Code Status: DNR Family Communication: Patient's husband at the bedside. Disposition Plan: Status is: Inpatient  Remains inpatient appropriate because:Inpatient level of care appropriate due to severity of illness   Dispo: The patient is from: Home              Anticipated d/c is to: Home with home health services and wound care.              Anticipated d/c date is: Anticipate tomorrow.              Patient currently is not medically stable to d/c.    Consultants:   Wound care team  Procedures:   None  Antimicrobials:  Antibiotics Given (last 72 hours)    Date/Time Action Medication Dose Rate   04/29/20 2225 New Bag/Given   vancomycin (VANCOREADY) IVPB 750 mg/150 mL 750 mg 150 mL/hr   04/30/20 1055 New Bag/Given   piperacillin-tazobactam (ZOSYN) IVPB 3.375 g 3.375 g 12.5 mL/hr   04/30/20 2355 New Bag/Given  [just got IV assess]   piperacillin-tazobactam (ZOSYN) IVPB 3.375 g 3.375 g 12.5 mL/hr   05/01/20 0904 New Bag/Given   piperacillin-tazobactam (ZOSYN) IVPB 3.375 g 3.375 g 12.5 mL/hr         Subjective: Patient seen and examined.  No overnight events.  Had low-grade temperature less than 100 overnight.  Denies any nausea vomiting.  Husband at the bedside.  Still has painful legs, however better than before.  Objective: Vitals:   04/30/20 2145 04/30/20 2307 05/01/20 0200 05/01/20 0506  BP: (!) 92/41 (!) 102/49 (!) 114/47 Marland Kitchen)  116/52  Pulse: 83 99 98 (!) 102  Resp: 16   16  Temp: 97.8 F (36.6 C)   97.8 F (36.6 C)  TempSrc: Axillary   Oral  SpO2: 94% 96%  96%  Weight:      Height:        Intake/Output Summary (Last 24 hours) at 05/01/2020 1201 Last data filed at 05/01/2020 0900 Gross per 24 hour  Intake 511.78 ml  Output --  Net 511.78 ml   Filed Weights    04/27/20 1507 04/29/20 0530 04/30/20 0336  Weight: 61.2 kg 62.2 kg 66.3 kg    Examination:  General exam: Appears calm and comfortable, currently on room air. Respiratory system: Clear to auscultation. Respiratory effort normal. No added sounds. Cardiovascular system: S1 & S2 heard, RRR. No JVD, murmurs, rubs, gallops or clicks.  Gastrointestinal system: Abdomen is nondistended, soft and nontender. No organomegaly or masses felt. Normal bowel sounds heard. Central nervous system: Alert and oriented. No focal neurological deficits. Extremities: Symmetric 5 x 5 power. Skin:  Pictures in the chart.  Bilateral extensive wounds on the legs with a yellow slough and serosanguineous discharge. Receding erythema.         Data Reviewed: I have personally reviewed following labs and imaging studies  CBC: Recent Labs  Lab 04/27/20 1517 04/28/20 0543 04/29/20 0501 04/30/20 0647 05/01/20 0626  WBC 17.6* 21.5* 23.0* 31.0* 24.4*  NEUTROABS  --  17.4* 18.3* 26.6* 20.4*  HGB 10.5* 10.7* 9.3* 9.1* 8.9*  HCT 33.6* 33.7* 31.0* 29.7* 28.7*  MCV 96.0 96.0 101.0* 98.3 98.3  PLT 364 403* 287 299 161   Basic Metabolic Panel: Recent Labs  Lab 04/27/20 1517 04/28/20 0543 04/29/20 0501 04/30/20 0647 05/01/20 0626  NA 135 136 139 136 137  K 4.2 4.1 3.9 3.6 3.8  CL 104 104 113* 110 110  CO2 17* 19* 15* 17* 17*  GLUCOSE 114* 135* 112* 118* 102*  BUN 90* 86* 85* 71* 77*  CREATININE 3.12* 2.34* 1.76* 1.92* 2.23*  CALCIUM 7.8* 7.8* 7.8* 7.9* 8.2*  MG  --  2.6* 2.4  --   --   PHOS  --   --  5.1*  --   --    GFR: Estimated Creatinine Clearance: 16.6 mL/min (A) (by C-G formula based on SCr of 2.23 mg/dL (H)). Liver Function Tests: Recent Labs  Lab 04/27/20 1730 04/28/20 0543 04/29/20 0501 04/30/20 0647 05/01/20 0626  AST 21 39 35 27 20  ALT 16 21 18 17 15   ALKPHOS 75 74 59 66 64  BILITOT 0.5 0.5 0.6 0.6 0.7  PROT 6.8 6.5 5.6* 6.1* 5.7*  ALBUMIN 2.2* 2.1* 1.8* 1.7* 1.6*   No  results for input(s): LIPASE, AMYLASE in the last 168 hours. No results for input(s): AMMONIA in the last 168 hours. Coagulation Profile: No results for input(s): INR, PROTIME in the last 168 hours. Cardiac Enzymes: No results for input(s): CKTOTAL, CKMB, CKMBINDEX, TROPONINI in the last 168 hours. BNP (last 3 results) No results for input(s): PROBNP in the last 8760 hours. HbA1C: No results for input(s): HGBA1C in the last 72 hours. CBG: Recent Labs  Lab 04/27/20 1512 04/27/20 1732  GLUCAP 66* 91   Lipid Profile: No results for input(s): CHOL, HDL, LDLCALC, TRIG, CHOLHDL, LDLDIRECT in the last 72 hours. Thyroid Function Tests: No results for input(s): TSH, T4TOTAL, FREET4, T3FREE, THYROIDAB in the last 72 hours. Anemia Panel: No results for input(s): VITAMINB12, FOLATE, FERRITIN, TIBC, IRON, RETICCTPCT in the last  72 hours. Sepsis Labs: No results for input(s): PROCALCITON, LATICACIDVEN in the last 168 hours.  Recent Results (from the past 240 hour(s))  Resp Panel by RT-PCR (Flu A&B, Covid) Nasopharyngeal Swab     Status: None   Collection Time: 04/27/20  7:00 PM   Specimen: Nasopharyngeal Swab; Nasopharyngeal(NP) swabs in vial transport medium  Result Value Ref Range Status   SARS Coronavirus 2 by RT PCR NEGATIVE NEGATIVE Final    Comment: (NOTE) SARS-CoV-2 target nucleic acids are NOT DETECTED.  The SARS-CoV-2 RNA is generally detectable in upper respiratory specimens during the acute phase of infection. The lowest concentration of SARS-CoV-2 viral copies this assay can detect is 138 copies/mL. A negative result does not preclude SARS-Cov-2 infection and should not be used as the sole basis for treatment or other patient management decisions. A negative result may occur with  improper specimen collection/handling, submission of specimen other than nasopharyngeal swab, presence of viral mutation(s) within the areas targeted by this assay, and inadequate number of  viral copies(<138 copies/mL). A negative result must be combined with clinical observations, patient history, and epidemiological information. The expected result is Negative.  Fact Sheet for Patients:  EntrepreneurPulse.com.au  Fact Sheet for Healthcare Providers:  IncredibleEmployment.be  This test is no t yet approved or cleared by the [place] FDA and  has been authorized for detection and/or diagnosis of SARS-CoV-2 by FDA under an Emergency Use Authorization (EUA). This EUA will remain  in effect (meaning this test can be used) for the duration of the COVID-19 declaration under Section 564(b)(1) of the Act, 21 U.S.C.section 360bbb-3(b)(1), unless the authorization is terminated  or revoked sooner.       Influenza A by PCR NEGATIVE NEGATIVE Final   Influenza B by PCR NEGATIVE NEGATIVE Final    Comment: (NOTE) The Xpert Xpress SARS-CoV-2/FLU/RSV plus assay is intended as an aid in the diagnosis of influenza from Nasopharyngeal swab specimens and should not be used as a sole basis for treatment. Nasal washings and aspirates are unacceptable for Xpert Xpress SARS-CoV-2/FLU/RSV testing.  Fact Sheet for Patients: EntrepreneurPulse.com.au  Fact Sheet for Healthcare Providers: IncredibleEmployment.be  This test is not yet approved or cleared by the [place] FDA and has been authorized for detection and/or diagnosis of SARS-CoV-2 by FDA under an Emergency Use Authorization (EUA). This EUA will remain in effect (meaning this test can be used) for the duration of the COVID-19 declaration under Section 564(b)(1) of the Act, 21 U.S.C. section 360bbb-3(b)(1), unless the authorization is terminated or revoked.  Performed at [facility], [address] [place], [place] [address]   Blood Cultures x 2 sites     Status: None (Preliminary result)   Collection Time: 04/28/20  5:43 AM   Specimen: Left  Antecubital; Blood  Result Value Ref Range Status   Specimen Description LEFT ANTECUBITAL  Final   Special Requests   Final    Blood Culture adequate volume BOTTLES DRAWN AEROBIC AND ANAEROBIC   Culture   Final    NO GROWTH 3 DAYS Performed at [facility], [address] [place], [place] [address]    Report Status PENDING  Incomplete  Blood Cultures x 2 sites     Status: None (Preliminary result)   Collection Time: 04/28/20  5:43 AM   Specimen: BLOOD RIGHT HAND  Result Value Ref Range Status   Specimen Description BLOOD RIGHT HAND  Final   Special Requests   Final    BOTTLES DRAWN AEROBIC AND ANAEROBIC Blood  Culture adequate volume   Culture   Final    NO GROWTH 3 DAYS Performed at [facility], [address], [place], [place] [address]    Report Status PENDING  Incomplete         Radiology Studies: No results found.      Scheduled Meds: . aspirin EC  81 mg Oral Daily  . atorvastatin  20 mg Oral Daily  . collagenase   Topical Daily  . feeding supplement  237 mL Oral BID BM  . gabapentin  300 mg Oral BID  . heparin  5,000 Units Subcutaneous Q8H  . multivitamin with minerals  1 tablet Oral Daily  . nutrition supplement (JUVEN)  1 packet Oral BID BM  . silver sulfADIAZINE  1 application Topical Daily   Continuous Infusions: . sodium chloride 100 mL/hr at 05/01/20 0806  . piperacillin-tazobactam (ZOSYN)  IV 3.375 g (05/01/20 0904)  . vancomycin 750 mg (04/29/20 2225)     LOS: 4 days    Time spent: 30 minutes     Barb Merino, MD [facility] Pager [phone]

## 2020-05-01 NOTE — Progress Notes (Signed)
Physical Therapy Note  Patient Details  Name: [name] [name] MRN: [id number] Date of Birth: [birth year] Today's Date: 05/01/2020    Pt lethargic due to pain meds and could not participate today  Teena Irani, PTA/CLT [phone] 05/01/2020, 2:42 PM

## 2020-05-02 ENCOUNTER — Inpatient Hospital Stay (HOSPITAL_COMMUNITY): Payer: Medicare Other

## 2020-05-02 ENCOUNTER — Ambulatory Visit (HOSPITAL_COMMUNITY): Payer: Medicare Other

## 2020-05-02 LAB — BASIC METABOLIC PANEL
Anion gap: 12 (ref 5–15)
BUN: 82 mg/dL — ABNORMAL HIGH (ref 8–23)
CO2: 15 mmol/L — ABNORMAL LOW (ref 22–32)
Calcium: 8.4 mg/dL — ABNORMAL LOW (ref 8.9–10.3)
Chloride: 112 mmol/L — ABNORMAL HIGH (ref 98–111)
Creatinine, Ser: 2.21 mg/dL — ABNORMAL HIGH (ref 0.44–1.00)
GFR, Estimated: 21 mL/min — ABNORMAL LOW (ref >60)
Glucose, Bld: 111 mg/dL — ABNORMAL HIGH (ref 70–99)
Potassium: 4 mmol/L (ref 3.5–5.1)
Sodium: 139 mmol/L (ref 135–145)

## 2020-05-02 LAB — CBC WITH DIFFERENTIAL/PLATELET
Abs Immature Granulocytes: 1.35 10*3/uL — ABNORMAL HIGH (ref 0.00–0.07)
Basophils Absolute: 0.2 10*3/uL — ABNORMAL HIGH (ref 0.0–0.1)
Basophils Relative: 1 %
Eosinophils Absolute: 0.2 10*3/uL (ref 0.0–0.5)
Eosinophils Relative: 1 %
HCT: 30.2 % — ABNORMAL LOW (ref 36.0–46.0)
Hemoglobin: 9.1 g/dL — ABNORMAL LOW (ref 12.0–15.0)
Immature Granulocytes: 5 %
Lymphocytes Relative: 11 %
Lymphs Abs: 2.8 10*3/uL (ref 0.7–4.0)
MCH: 30.1 pg (ref 26.0–34.0)
MCHC: 30.1 g/dL (ref 30.0–36.0)
MCV: 100 fL (ref 80.0–100.0)
Monocytes Absolute: 1.8 10*3/uL — ABNORMAL HIGH (ref 0.1–1.0)
Monocytes Relative: 7 %
Neutro Abs: 18.7 10*3/uL — ABNORMAL HIGH (ref 1.7–7.7)
Neutrophils Relative %: 75 %
Platelets: 371 10*3/uL (ref 150–400)
RBC: 3.02 MIL/uL — ABNORMAL LOW (ref 3.87–5.11)
RDW: 18 % — ABNORMAL HIGH (ref 11.5–15.5)
WBC: 25 10*3/uL — ABNORMAL HIGH (ref 4.0–10.5)
nRBC: 0.1 % (ref 0.0–0.2)

## 2020-05-02 LAB — URINALYSIS, ROUTINE W REFLEX MICROSCOPIC
Bilirubin Urine: NEGATIVE
Glucose, UA: NEGATIVE mg/dL
Ketones, ur: NEGATIVE mg/dL
Leukocytes,Ua: NEGATIVE
Nitrite: NEGATIVE
Protein, ur: NEGATIVE mg/dL
Specific Gravity, Urine: 1.014 (ref 1.005–1.030)
pH: 5 (ref 5.0–8.0)

## 2020-05-02 MED ORDER — CHLORHEXIDINE GLUCONATE CLOTH 2 % EX PADS
6.0000 | MEDICATED_PAD | Freq: Every day | CUTANEOUS | Status: DC
  Administered 2020-05-02 – 2020-05-05 (×4): 6 via TOPICAL

## 2020-05-02 MED ORDER — TAMSULOSIN HCL 0.4 MG PO CAPS
0.4000 mg | ORAL_CAPSULE | Freq: Every day | ORAL | Status: DC
  Administered 2020-05-02 – 2020-05-05 (×4): 0.4 mg via ORAL
  Filled 2020-05-02 (×4): qty 1

## 2020-05-02 NOTE — Consult Note (Signed)
WOC consulted for LEs, was seen this admission per my [place] partner, see note from 12/10.  Orders in the computer for care.    Re consult if needed, will not follow at this time. Thanks  Mikita Lesmeister [employer], RN,CWOCN, CNS, [place] [phone])

## 2020-05-02 NOTE — Progress Notes (Signed)
Renal functions not appropriately improving prompted [place] to do a renal ultrasound that shows moderate hydronephrosis on the left, urinary retention.  Patient has adequate urine output however random scanning shows more than 500 cc retention.  Patient is retaining probably due to immobility.  Plan: Foley catheter for decompression.  Discharged with Foley catheter.  Please reschedule urology follow-up and voiding trial in 7 to 10 days.  Once patient is more mobile, patient should be able to void.  We will also start on Flomax. MRIs with no evidence of osteomyelitis or collection. Discontinue IV fluids, patient has some congestion and has received enough IV resuscitation.

## 2020-05-02 NOTE — Progress Notes (Signed)
Bladder scan complete 590ml. New orders to insert foley catheter.

## 2020-05-02 NOTE — Progress Notes (Signed)
PROGRESS NOTE    [name] [name]  RCV:[id number] DOB: [birth year] DOA: 04/27/2020 PCP: Asencion Noble, MD   Brief Narrative:  53 female with history of hypertension, coronary artery stenosis, venous stasis ulcers of lower extremities and recently worsening leg ulcers presented to the ER with pain, leg wounds, episodes of less responsiveness that patient describes as painful conditions. Recently suffering from difficult to treat leg wounds, started on diuretics and going to outpatient wound center. At the emergency room, afebrile. Initial blood pressure 82/61. WBC count 17.6. Creatinine 3.12, recent baseline from 6 years ago was normal. Patient was given IV fluid, pain medications and antibiotics and admitted. COVID-19 negative. Chest x-ray normal. Admitted with significant infection of the both legs, elevated white counts, acute kidney injury. Blood cultures negative so far.  Local wound culture with gram-positive and gram-negative.   Assessment & Plan:   Principal Problem:   AKI (acute kidney injury) ([place]) Active Problems:   Syncope and collapse   Dehydration   Moderate protein-calorie malnutrition (HCC)   Cellulitis   HTN (hypertension)  Syncope and collapse with orthostasis, hypotension, moderate dehydration and acute kidney injury: acute kidney injury due to combination of active infection, dehydration and diuretics. Patient also on ARB along with hydrochlorothiazide. Patient also uses NSAIDs. -Blood pressures stabilized.  Hydrating with IV fluids.  Antihypertensives on hold.   -Less likely cardiac cause, echocardiogram was normal.  Currently duplexes with known stenosis on the right ICA.  She does not have any focal deficits. - Lab Results  Component Value Date   CREATININE 2.21 (H) 05/02/2020   CREATININE 2.23 (H) 05/01/2020   CREATININE 1.92 (H) 04/30/2020  Creatinine is hovering around 2.2.  Family reported no issues with normal renal function test recently. Tried to connect to her  primary care physician's office, no success.  Will check renal ultrasound to rule out any cause for obstruction or hydronephrosis.  Continue maintenance IV fluids today.  Bilateral lower extremity wounds/infected wounds with elevated white cell count: -Seen by wound care team. Local wound care. -Patient has fluctuating leukocytosis and fever.  Antibiotic broadened to cover for MRSA and Pseudomonas.  Continue on vancomycin and Zosyn.   -Some clinical improvement today.   -Unable to have CT scan with contrast, will check MRI without contrast both legs to rule out any deep abscess/osteomyelitis due to severity of pain and persistent leukocytosis. -Seen by vascular surgery as outpatient, reportedly normal ABI.  Hypertension: Blood pressures low normal.  Diuretics discontinued due to renal function abnormality.  At this time she does not need any antihypertensives.   DVT prophylaxis: heparin injection 5,000 Units Start: 04/28/20 0600   Code Status: DNR Family Communication: Patient's husband at the bedside.  Patient's daughter on the phone. Disposition Plan: Status is: Inpatient  Remains inpatient appropriate because:Inpatient level of care appropriate due to severity of illness   Dispo: The patient is from: Home              Anticipated d/c is to: Home with home health services and wound care.              Anticipated d/c date is: Anticipate tomorrow if adequate improvement in white count and normal MRI.              Patient currently is not medically stable to d/c.    Consultants:   Wound care team  Procedures:   None  Antimicrobials:  Antibiotics Given (last 72 hours)    Date/Time Action Medication Dose Rate  04/29/20 2225 New Bag/Given   vancomycin (VANCOREADY) IVPB 750 mg/150 mL 750 mg 150 mL/hr   04/30/20 1055 New Bag/Given   piperacillin-tazobactam (ZOSYN) IVPB 3.375 g 3.375 g 12.5 mL/hr   04/30/20 2355 New Bag/Given  [just got IV assess]   piperacillin-tazobactam  (ZOSYN) IVPB 3.375 g 3.375 g 12.5 mL/hr   05/01/20 0904 New Bag/Given   piperacillin-tazobactam (ZOSYN) IVPB 3.375 g 3.375 g 12.5 mL/hr   05/01/20 2113 New Bag/Given   vancomycin (VANCOREADY) IVPB 750 mg/150 mL 750 mg 150 mL/hr   05/01/20 2119 New Bag/Given   piperacillin-tazobactam (ZOSYN) IVPB 3.375 g 3.375 g 12.5 mL/hr   05/02/20 4081 New Bag/Given   piperacillin-tazobactam (ZOSYN) IVPB 3.375 g 3.375 g 12.5 mL/hr         Subjective: Patient seen and examined.  No overnight events.  Mostly afebrile last 24 hours.  WC count 25,000.  She is just worried about me changing her dressing of the legs.  Objective: Vitals:   05/01/20 1406 05/01/20 1633 05/01/20 2132 05/02/20 0554  BP: (!) 85/46 (!) 113/44 (!) 101/53 (!) 119/58  Pulse: 91 (!) 109 (!) 112 (!) 103  Resp: 18 16 18    Temp: 98.2 F (36.8 C) 98.8 F (37.1 C) 98.6 F (37 C) 98.1 F (36.7 C)  TempSrc: Oral   Oral  SpO2: 96% 94% 95% 93%  Weight:      Height:        Intake/Output Summary (Last 24 hours) at 05/02/2020 1255 Last data filed at 05/01/2020 1400 Gross per 24 hour  Intake 240 ml  Output --  Net 240 ml   Filed Weights   04/27/20 1507 04/29/20 0530 04/30/20 0336  Weight: 61.2 kg 62.2 kg 66.3 kg    Examination:  General exam: Appears calm and comfortable,  Respiratory system: Clear to auscultation. Respiratory effort normal. No added sounds. Cardiovascular system: S1 & S2 heard, RRR. No JVD, murmurs, rubs, gallops or clicks.  Gastrointestinal system: Abdomen is nondistended, soft and nontender. No organomegaly or masses felt. Normal bowel sounds heard. Central nervous system: Alert and oriented. No focal neurological deficits. Extremities: Symmetric 5 x 5 power. Skin:  Pictures in the chart.  Bilateral extensive wounds on the legs with a yellow slough and serosanguineous discharge. Receding erythema of the rest of the leg.  1+ pedal edema on the distal leg.        Data Reviewed: I have personally  reviewed following labs and imaging studies  CBC: Recent Labs  Lab 04/28/20 0543 04/29/20 0501 04/30/20 0647 05/01/20 0626 05/02/20 0613  WBC 21.5* 23.0* 31.0* 24.4* 25.0*  NEUTROABS 17.4* 18.3* 26.6* 20.4* 18.7*  HGB 10.7* 9.3* 9.1* 8.9* 9.1*  HCT 33.7* 31.0* 29.7* 28.7* 30.2*  MCV 96.0 101.0* 98.3 98.3 100.0  PLT 403* 287 299 302 448   Basic Metabolic Panel: Recent Labs  Lab 04/28/20 0543 04/29/20 0501 04/30/20 0647 05/01/20 0626 05/02/20 0613  NA 136 139 136 137 139  K 4.1 3.9 3.6 3.8 4.0  CL 104 113* 110 110 112*  CO2 19* 15* 17* 17* 15*  GLUCOSE 135* 112* 118* 102* 111*  BUN 86* 85* 71* 77* 82*  CREATININE 2.34* 1.76* 1.92* 2.23* 2.21*  CALCIUM 7.8* 7.8* 7.9* 8.2* 8.4*  MG 2.6* 2.4  --   --   --   PHOS  --  5.1*  --   --   --    GFR: Estimated Creatinine Clearance: 16.7 mL/min (A) (by C-G formula based on SCr of  2.21 mg/dL (H)). Liver Function Tests: Recent Labs  Lab 04/27/20 1730 04/28/20 0543 04/29/20 0501 04/30/20 0647 05/01/20 0626  AST 21 39 35 27 20  ALT 16 21 18 17 15   ALKPHOS 75 74 59 66 64  BILITOT 0.5 0.5 0.6 0.6 0.7  PROT 6.8 6.5 5.6* 6.1* 5.7*  ALBUMIN 2.2* 2.1* 1.8* 1.7* 1.6*   No results for input(s): LIPASE, AMYLASE in the last 168 hours. No results for input(s): AMMONIA in the last 168 hours. Coagulation Profile: No results for input(s): INR, PROTIME in the last 168 hours. Cardiac Enzymes: No results for input(s): CKTOTAL, CKMB, CKMBINDEX, TROPONINI in the last 168 hours. BNP (last 3 results) No results for input(s): PROBNP in the last 8760 hours. HbA1C: No results for input(s): HGBA1C in the last 72 hours. CBG: Recent Labs  Lab 04/27/20 1512 04/27/20 1732  GLUCAP 66* 91   Lipid Profile: No results for input(s): CHOL, HDL, LDLCALC, TRIG, CHOLHDL, LDLDIRECT in the last 72 hours. Thyroid Function Tests: No results for input(s): TSH, T4TOTAL, FREET4, T3FREE, THYROIDAB in the last 72 hours. Anemia Panel: No results for  input(s): VITAMINB12, FOLATE, FERRITIN, TIBC, IRON, RETICCTPCT in the last 72 hours. Sepsis Labs: No results for input(s): PROCALCITON, LATICACIDVEN in the last 168 hours.  Recent Results (from the past 240 hour(s))  Resp Panel by RT-PCR (Flu A&B, Covid) Nasopharyngeal Swab     Status: None   Collection Time: 04/27/20  7:00 PM   Specimen: Nasopharyngeal Swab; Nasopharyngeal(NP) swabs in vial transport medium  Result Value Ref Range Status   SARS Coronavirus 2 by RT PCR NEGATIVE NEGATIVE Final    Comment: (NOTE) SARS-CoV-2 target nucleic acids are NOT DETECTED.  The SARS-CoV-2 RNA is generally detectable in upper respiratory specimens during the acute phase of infection. The lowest concentration of SARS-CoV-2 viral copies this assay can detect is 138 copies/mL. A negative result does not preclude SARS-Cov-2 infection and should not be used as the sole basis for treatment or other patient management decisions. A negative result may occur with  improper specimen collection/handling, submission of specimen other than nasopharyngeal swab, presence of viral mutation(s) within the areas targeted by this assay, and inadequate number of viral copies(<138 copies/mL). A negative result must be combined with clinical observations, patient history, and epidemiological information. The expected result is Negative.  Fact Sheet for Patients:  EntrepreneurPulse.com.au  Fact Sheet for Healthcare Providers:  IncredibleEmployment.be  This test is no t yet approved or cleared by the [place] FDA and  has been authorized for detection and/or diagnosis of SARS-CoV-2 by FDA under an Emergency Use Authorization (EUA). This EUA will remain  in effect (meaning this test can be used) for the duration of the COVID-19 declaration under Section 564(b)(1) of the Act, 21 U.S.C.section 360bbb-3(b)(1), unless the authorization is terminated  or revoked sooner.        Influenza A by PCR NEGATIVE NEGATIVE Final   Influenza B by PCR NEGATIVE NEGATIVE Final    Comment: (NOTE) The Xpert Xpress SARS-CoV-2/FLU/RSV plus assay is intended as an aid in the diagnosis of influenza from Nasopharyngeal swab specimens and should not be used as a sole basis for treatment. Nasal washings and aspirates are unacceptable for Xpert Xpress SARS-CoV-2/FLU/RSV testing.  Fact Sheet for Patients: EntrepreneurPulse.com.au  Fact Sheet for Healthcare Providers: IncredibleEmployment.be  This test is not yet approved or cleared by the [place] FDA and has been authorized for detection and/or diagnosis of SARS-CoV-2 by FDA under an Emergency Use Authorization (  EUA). This EUA will remain in effect (meaning this test can be used) for the duration of the COVID-19 declaration under Section 564(b)(1) of the Act, 21 U.S.C. section 360bbb-3(b)(1), unless the authorization is terminated or revoked.  Performed at [facility], [address], [place], [place] [address]   Blood Cultures x 2 sites     Status: None (Preliminary result)   Collection Time: 04/28/20  5:43 AM   Specimen: Left Antecubital; Blood  Result Value Ref Range Status   Specimen Description LEFT ANTECUBITAL  Final   Special Requests   Final    Blood Culture adequate volume BOTTLES DRAWN AEROBIC AND ANAEROBIC   Culture   Final    NO GROWTH 3 DAYS Performed at [facility], [address] [place], [place] [address]    Report Status PENDING  Incomplete  Blood Cultures x 2 sites     Status: None (Preliminary result)   Collection Time: 04/28/20  5:43 AM   Specimen: BLOOD RIGHT HAND  Result Value Ref Range Status   Specimen Description BLOOD RIGHT HAND  Final   Special Requests   Final    BOTTLES DRAWN AEROBIC AND ANAEROBIC Blood Culture adequate volume   Culture   Final    NO GROWTH 3 DAYS Performed at [facility], [address] [place], [place] [address]    Report  Status PENDING  Incomplete  Aerobic Culture (superficial specimen)     Status: None (Preliminary result)   Collection Time: 05/01/20 11:00 AM   Specimen: Wound  Result Value Ref Range Status   Specimen Description   Final    WOUND Performed at [facility], [address] [place], [place] [address]    Special Requests   Final    LEG Performed at [facility], [address], [place], [place] [address]    Gram Stain   Final    RARE WBC PRESENT, PREDOMINANTLY MONONUCLEAR FEW GRAM POSITIVE COCCI RARE GRAM NEGATIVE RODS    Culture   Final    TOO YOUNG TO READ Performed at [facility], [place] [address] [place], [place] [address]    Report Status PENDING  Incomplete         Radiology Studies: No results found.      Scheduled Meds:  aspirin EC  81 mg Oral Daily   atorvastatin  20 mg Oral Daily   collagenase   Topical Daily   feeding supplement  237 mL Oral BID BM   gabapentin  300 mg Oral BID   heparin  5,000 Units Subcutaneous Q8H   multivitamin with minerals  1 tablet Oral Daily   nutrition supplement (JUVEN)  1 packet Oral BID BM   silver sulfADIAZINE  1 application Topical Daily   Continuous Infusions:  sodium chloride 100 mL/hr at 05/01/20 0806   piperacillin-tazobactam (ZOSYN)  IV 3.375 g (05/02/20 0906)   vancomycin 750 mg (05/01/20 2113)     LOS: 5 days    Time spent: 30 minutes     Barb Merino, MD [facility] Pager [phone]

## 2020-05-02 NOTE — TOC Progression Note (Signed)
Transition of Care [facility]) - Progression Note    Patient Details  Name: [name] [name] MRN: [id number] Date of Birth: 1931-09-21  Transition of Care [facility]) CM/SW Contact  Salome Arnt, [place] Phone Number: 05/02/2020, 11:38 AM  Clinical Narrative:  LCSW discussed daily dressing changes with pt's husband and Rafael Hernandez. Pt's husband understands that home health will assist with wound care and teach family how to do dressing changes. TOC will continue to follow.       Expected Discharge Plan: [place] Barriers to Discharge: Continued Medical Work up  Expected Discharge Plan and Services Expected Discharge Plan: [place] In-house Referral: Clinical Social Work Discharge Planning Services: NA Post Acute Care Choice: [place] Living arrangements for the past 2 months: [place]                 DME Arranged: [place] rolling DME Agency: [place]: PT [place] Agency: [place] ([facility]) Date [place]: 04/29/20 Time [place]: 36 Representative spoke with at [place]: [place] ([place]) Interventions    Readmission Risk Interventions No flowsheet data found.

## 2020-05-03 ENCOUNTER — Telehealth (HOSPITAL_COMMUNITY): Payer: Self-pay | Admitting: Physical Therapy

## 2020-05-03 DIAGNOSIS — I4891 Unspecified atrial fibrillation: Secondary | ICD-10-CM

## 2020-05-03 DIAGNOSIS — D649 Anemia, unspecified: Secondary | ICD-10-CM

## 2020-05-03 DIAGNOSIS — I10 Essential (primary) hypertension: Secondary | ICD-10-CM

## 2020-05-03 LAB — CBC WITH DIFFERENTIAL/PLATELET
Basophils Absolute: 0.3 10*3/uL — ABNORMAL HIGH (ref 0.0–0.1)
Basophils Relative: 2 %
Eosinophils Absolute: 0.6 10*3/uL — ABNORMAL HIGH (ref 0.0–0.5)
Eosinophils Relative: 4 %
HCT: 24.4 % — ABNORMAL LOW (ref 36.0–46.0)
Hemoglobin: 7.6 g/dL — ABNORMAL LOW (ref 12.0–15.0)
Lymphocytes Relative: 13 %
Lymphs Abs: 2 10*3/uL (ref 0.7–4.0)
MCH: 30.2 pg (ref 26.0–34.0)
MCHC: 31.1 g/dL (ref 30.0–36.0)
MCV: 96.8 fL (ref 80.0–100.0)
Metamyelocytes Relative: 5 %
Monocytes Absolute: 0.9 10*3/uL (ref 0.1–1.0)
Monocytes Relative: 6 %
Myelocytes: 2 %
Neutro Abs: 10.1 10*3/uL — ABNORMAL HIGH (ref 1.7–7.7)
Neutrophils Relative %: 67 %
Platelets: 310 10*3/uL (ref 150–400)
Promyelocytes Relative: 1 %
RBC: 2.52 MIL/uL — ABNORMAL LOW (ref 3.87–5.11)
RDW: 17.4 % — ABNORMAL HIGH (ref 11.5–15.5)
WBC: 15 10*3/uL — ABNORMAL HIGH (ref 4.0–10.5)
nRBC: 0.2 % (ref 0.0–0.2)

## 2020-05-03 LAB — COMPREHENSIVE METABOLIC PANEL
ALT: 29 U/L (ref 0–44)
AST: 43 U/L — ABNORMAL HIGH (ref 15–41)
Albumin: 1.5 g/dL — ABNORMAL LOW (ref 3.5–5.0)
Alkaline Phosphatase: 108 U/L (ref 38–126)
Anion gap: 9 (ref 5–15)
BUN: 68 mg/dL — ABNORMAL HIGH (ref 8–23)
CO2: 16 mmol/L — ABNORMAL LOW (ref 22–32)
Calcium: 7.9 mg/dL — ABNORMAL LOW (ref 8.9–10.3)
Chloride: 113 mmol/L — ABNORMAL HIGH (ref 98–111)
Creatinine, Ser: 1.48 mg/dL — ABNORMAL HIGH (ref 0.44–1.00)
GFR, Estimated: 34 mL/min — ABNORMAL LOW (ref >60)
Glucose, Bld: 124 mg/dL — ABNORMAL HIGH (ref 70–99)
Potassium: 3.5 mmol/L (ref 3.5–5.1)
Sodium: 138 mmol/L (ref 135–145)
Total Bilirubin: 0.4 mg/dL (ref 0.3–1.2)
Total Protein: 5.5 g/dL — ABNORMAL LOW (ref 6.5–8.1)

## 2020-05-03 LAB — BASIC METABOLIC PANEL
Anion gap: 8 (ref 5–15)
BUN: 73 mg/dL — ABNORMAL HIGH (ref 8–23)
CO2: 17 mmol/L — ABNORMAL LOW (ref 22–32)
Calcium: 7.8 mg/dL — ABNORMAL LOW (ref 8.9–10.3)
Chloride: 113 mmol/L — ABNORMAL HIGH (ref 98–111)
Creatinine, Ser: 1.62 mg/dL — ABNORMAL HIGH (ref 0.44–1.00)
GFR, Estimated: 31 mL/min — ABNORMAL LOW (ref >60)
Glucose, Bld: 119 mg/dL — ABNORMAL HIGH (ref 70–99)
Potassium: 3.5 mmol/L (ref 3.5–5.1)
Sodium: 138 mmol/L (ref 135–145)

## 2020-05-03 LAB — MAGNESIUM: Magnesium: 2.3 mg/dL (ref 1.7–2.4)

## 2020-05-03 LAB — URINE CULTURE: Culture: NO GROWTH

## 2020-05-03 LAB — MRSA PCR SCREENING: MRSA by PCR: NEGATIVE

## 2020-05-03 MED ORDER — AMIODARONE HCL IN DEXTROSE 360-4.14 MG/200ML-% IV SOLN
60.0000 mg/h | INTRAVENOUS | Status: DC
  Administered 2020-05-03: 10:00:00 60 mg/h via INTRAVENOUS
  Filled 2020-05-03 (×2): qty 200

## 2020-05-03 MED ORDER — METOPROLOL TARTRATE 5 MG/5ML IV SOLN
INTRAVENOUS | Status: AC
  Filled 2020-05-03: qty 5

## 2020-05-03 MED ORDER — AMIODARONE HCL IN DEXTROSE 360-4.14 MG/200ML-% IV SOLN
30.0000 mg/h | INTRAVENOUS | Status: DC
  Administered 2020-05-03 (×2): 30 mg/h via INTRAVENOUS
  Filled 2020-05-03: qty 200

## 2020-05-03 MED ORDER — SODIUM CHLORIDE 0.9 % IV SOLN: INTRAVENOUS | Status: DC

## 2020-05-03 MED ORDER — METOPROLOL TARTRATE 5 MG/5ML IV SOLN
5.0000 mg | Freq: Once | INTRAVENOUS | Status: AC
  Administered 2020-05-03: 10:00:00 5 mg via INTRAVENOUS
  Filled 2020-05-03: qty 5

## 2020-05-03 MED ORDER — ADENOSINE 6 MG/2ML IV SOLN
INTRAVENOUS | Status: AC
  Filled 2020-05-03: qty 2

## 2020-05-03 MED ORDER — DILTIAZEM HCL-DEXTROSE 125-5 MG/125ML-% IV SOLN (PREMIX)
5.0000 mg/h | INTRAVENOUS | Status: DC
  Filled 2020-05-03: qty 125

## 2020-05-03 MED ORDER — AMIODARONE LOAD VIA INFUSION
150.0000 mg | Freq: Once | INTRAVENOUS | Status: AC
  Administered 2020-05-03: 10:00:00 150 mg via INTRAVENOUS
  Filled 2020-05-03: qty 83.34

## 2020-05-03 NOTE — TOC Progression Note (Signed)
Transition of Care [facility]) - Progression Note   Patient Details  Name: [name] [name] MRN: [id number] Date of Birth: [birth year]  Transition of Care [facility]) CM/SW [place], LCSW Phone Number: 05/03/2020, 12:43 PM  Clinical Narrative: PT evaluation recommends SNF for rehab. CSW met with patient and daughter to discuss SNF. Patient was sleeping. Daughter agreeable to SNF and reported patient is unvaccinated. FL2 completed and PASRR received. Initial referral faxed out. TOC awaiting bed offers.  Expected Discharge Plan: [place] Barriers to Discharge: Continued Medical Work up  Expected Discharge Plan and Services Expected Discharge Plan: [place] In-house Referral: Clinical Social Work Discharge Planning Services: NA Post Acute Care Choice: [place] Living arrangements for the past 2 months: Single Family Home             DME Arranged: [place] rolling DME Agency: [place]: PT [place] Agency: [place] ([place]) Date [place]: 04/29/20 Time [place]: 45 Representative spoke with at [place]: Corene Cornea  Readmission Risk Interventions No flowsheet data found.

## 2020-05-03 NOTE — Progress Notes (Signed)
   05/03/20 0908  Assess: MEWS Score  Temp 98 F (36.7 C)  BP (!) 96/59  Pulse Rate (!) 150  ECG Heart Rate (!) 18  SpO2 100 %  O2 Device Room Air  Assess: MEWS Score  MEWS Temp 0  MEWS Systolic 1  MEWS Pulse 2  MEWS RR 0  MEWS LOC 0  MEWS Score 3  MEWS Score Color Yellow  Assess: if the MEWS score is Yellow or Red  Were vital signs taken at a resting state? Yes  Focused Assessment Change from prior assessment (see assessment flowsheet)  Early Detection of Sepsis Score *See Row Information* Medium  MEWS guidelines implemented *See Row Information* Yes  Treat  MEWS Interventions Escalated (See documentation below)  Pain Scale 0-10  Pain Score 0  Take Vital Signs  Increase Vital Sign Frequency  Yellow: Q 2hr X 2 then Q 4hr X 2, if remains yellow, continue Q 4hrs  Escalate  MEWS: Escalate Yellow: discuss with charge nurse/RN and consider discussing with provider and RRT  Notify: Charge Nurse/RN  Name of Charge Nurse/RN Notified Donavan Foil, RN  Date Charge Nurse/RN Notified 05/03/20  Time Charge Nurse/RN Notified 0908  Notify: Provider  Provider Name/Title dr Verlon Au  Date Provider Notified 05/03/20  Time Provider Notified [phone]  Notification Type Page  Notification Reason Change in status (HR 180s-190s on tele)  Response See new orders  Date of Provider Response 05/03/20  Time of Provider Response 0911  Notify: Rapid Response  Name of Rapid Response RN Notified tiffany vogler, RN  Date Rapid Response Notified 05/03/20  Time Rapid Response Notified 0908  Document  Patient Outcome Transferred/level of care increased (transferred to stepdown)  Progress note created (see row info) Yes

## 2020-05-03 NOTE — NC FL2 (Signed)
[place] LEVEL OF CARE SCREENING TOOL     IDENTIFICATION  Patient Name: [name] [name] Birthdate: 08-12-1931 Sex: female Admission Date (Current Location): 04/27/2020  [facility] and [place] Number:  [employer] and Address:  [facility] [address], [place]      Provider Number: [id number]  Attending Physician Name and Address:  Nita Sells, MD  Relative Name and Phone Number:  Gene Kittle (husband) Ph: [phone]    Current Level of Care: Hospital Recommended Level of Care: [place] Prior Approval Number:    Date Approved/Denied:   PASRR Number: [id number]  Discharge Plan: SNF    Current Diagnoses: Patient Active Problem List   Diagnosis Date Noted  . AKI (acute kidney injury) ([place]) 04/27/2020  . Syncope and collapse 04/27/2020  . Dehydration 04/27/2020  . Moderate protein-calorie malnutrition ([place]) 04/27/2020  . Cellulitis 04/27/2020  . HTN (hypertension) 04/27/2020  . History of colon cancer 12/24/2017  . Colon cancer ([place]) 09/11/2013  . Small bowel obstruction ([place]) 08/25/2013  . Tachycardia 08/25/2013  . Lesion of colon 08/25/2013  . Anemia 08/25/2013  . SBO (small bowel obstruction) ([place]) 08/25/2013    Orientation RESPIRATION BLADDER Height & Weight     Self,Situation,Place  Normal Incontinent Weight: 137 lb 2 oz (62.2 kg) Height:  5\' 3"  (160 cm)  BEHAVIORAL SYMPTOMS/MOOD NEUROLOGICAL BOWEL NUTRITION STATUS      Continent Diet (Heart healthy)  AMBULATORY STATUS COMMUNICATION OF NEEDS Skin   Extensive Assist Verbally Other (Comment) (Blistering/weeping: bilateral legs (circumferential))                       Personal Care Assistance Level of Assistance  Bathing,Feeding,Dressing Bathing Assistance: Maximum assistance Feeding assistance: Limited assistance Dressing Assistance: Maximum assistance     Functional Limitations Info  Sight,Hearing,Speech Sight Info:  Adequate Hearing Info: Impaired Speech Info: Adequate    SPECIAL CARE FACTORS FREQUENCY  PT (By licensed PT)     PT Frequency: 5x's/week              Contractures Contractures Info: Not present    Additional Factors Info  Code Status,Psychotropic,Allergies Code Status Info: DNR Allergies Info: NKA Psychotropic Info: Neurontin (gabapentin)         Current Medications (05/03/2020):  This is the current hospital active medication list Current Facility-Administered Medications  Medication Dose Route Frequency Provider Last Rate Last Admin  . 0.9 %  sodium chloride infusion   Intravenous Continuous Nita Sells, MD 75 mL/hr at 05/03/20 1047 Infusion Verify at 05/03/20 1047  . acetaminophen (TYLENOL) tablet 650 mg  650 mg Oral Q6H PRN Zierle-Ghosh, Asia B, DO   650 mg at 05/01/20 0900   Or  . acetaminophen (TYLENOL) suppository 650 mg  650 mg Rectal Q6H PRN Zierle-Ghosh, Asia B, DO   650 mg at 04/28/20 2305  . amiodarone (NEXTERONE PREMIX) 360-4.14 MG/200ML-% (1.8 mg/mL) IV infusion  60 mg/hr Intravenous Continuous Nita Sells, MD 33.3 mL/hr at 05/03/20 1047 60 mg/hr at 05/03/20 1047   Followed by  . amiodarone (NEXTERONE PREMIX) 360-4.14 MG/200ML-% (1.8 mg/mL) IV infusion  30 mg/hr Intravenous Continuous Nita Sells, MD      . aspirin EC tablet 81 mg  81 mg Oral Daily Zierle-Ghosh, Asia B, DO   81 mg at 05/02/20 0901  . atorvastatin (LIPITOR) tablet 20 mg  20 mg Oral Daily Zierle-Ghosh, Asia B, DO   20 mg at 05/02/20 0901  . Chlorhexidine Gluconate  Cloth 2 % PADS 6 each  6 each Topical Daily Barb Merino, MD   6 each at 05/03/20 [phone]  . collagenase (SANTYL) ointment   Topical Daily Barb Merino, MD   Given at 05/01/20 0900  . feeding supplement (ENSURE ENLIVE / ENSURE PLUS) liquid 237 mL  237 mL Oral BID BM Barb Merino, MD   237 mL at 05/02/20 1514  . gabapentin (NEURONTIN) capsule 300 mg  300 mg Oral BID Zierle-Ghosh, Asia B, DO   300 mg at  05/02/20 2211  . heparin injection 5,000 Units  5,000 Units Subcutaneous Q8H Zierle-Ghosh, Asia B, DO   5,000 Units at 05/03/20 0527  . hydrALAZINE (APRESOLINE) injection 10 mg  10 mg Intravenous Q6H PRN Zierle-Ghosh, Asia B, DO      . morphine 2 MG/ML injection 2 mg  2 mg Intravenous Q2H PRN Zierle-Ghosh, Asia B, DO   2 mg at 04/30/20 0542  . multivitamin with minerals tablet 1 tablet  1 tablet Oral Daily Zierle-Ghosh, Asia B, DO   1 tablet at 05/02/20 0901  . nutrition supplement (JUVEN) (JUVEN) powder packet 1 packet  1 packet Oral BID BM Barb Merino, MD   1 packet at 05/02/20 1513  . ondansetron (ZOFRAN) tablet 4 mg  4 mg Oral Q6H PRN Zierle-Ghosh, Asia B, DO       Or  . ondansetron (ZOFRAN) injection 4 mg  4 mg Intravenous Q6H PRN Zierle-Ghosh, Asia B, DO      . oxyCODONE (Oxy IR/ROXICODONE) immediate release tablet 5 mg  5 mg Oral Q4H PRN Zierle-Ghosh, Asia B, DO   5 mg at 05/02/20 0022  . piperacillin-tazobactam (ZOSYN) IVPB 3.375 g  3.375 g Intravenous Q12H Barb Merino, MD   Stopped at 05/03/20 0204  . polyethylene glycol (MIRALAX / GLYCOLAX) packet 17 g  17 g Oral Daily PRN Zierle-Ghosh, Asia B, DO      . silver sulfADIAZINE (SILVADENE) 1 % cream 1 application  1 application Topical Daily Zierle-Ghosh, Asia B, DO   1 application at 77/82/42 0900  . tamsulosin (FLOMAX) capsule 0.4 mg  0.4 mg Oral Q supper Barb Merino, MD   0.4 mg at 05/02/20 1808  . vancomycin (VANCOREADY) IVPB 750 mg/150 mL  750 mg Intravenous Q48H Zierle-Ghosh, Asia B, DO   Stopped at 05/01/20 2213     Discharge Medications: Please see discharge summary for a list of discharge medications.  Relevant Imaging Results:  Relevant Lab Results:   Additional Information SSN: [id number]  Sherie Don, LCSW

## 2020-05-03 NOTE — Progress Notes (Signed)
Physical Therapy Treatment Patient Details Name: [name] [name] MRN: [id number] DOB: 11/27/1931 Today's Date: 05/03/2020    History of Present Illness [name] [name]  is a 86 y.o. female, of hypertension, carotid artery occlusion, hypertension, venous stasis changes of lower extremities, presents to the ED with a chief complaint of syncope. And a lot of pain and only intermittently answers questions secondary to the pain asking "please help me God," so husband at bedside provides most of the history. He reports that patient had got up from the recliner chair walk to the kitchen to make her self a cup of coffee, before she was able to get a coffee cup she felt herself falling backwards and could not stop it. This is what she told him about the event. It was unwitnessed. Patient reports that she did not have chest pain, palpitations, shortness of breath before the event. She is not sure if she hit her head. Neither of them are sure if she lost consciousness as he was in the back bedroom and did not hear her fall, only heard her calling for him after she was alert. He reports that she was immediately alert and oriented. She was not able to get up on her own, so he got her up and took her to the recliner chair. After some rest, patient was able to get up and walk around. She even did some household chores and went up and down stairs. It was time for them to go to their appointment at the independent wound care when he noted that she was not talking right, and not looking at him right. He suspected that she had had a stroke. He reports that he does not hear very well, so he cannot describe how she was not talking right. He said it was not confusion, and he does not think she was slurring her speech, but that something was wrong with her voice. He also cannot further explain how she was not looking at him right, he just thought it was not normal. So they called Dr. Ria Comment office who recommended he come into the ER. The  time of my exam he reports that patient is talking correctly, and looking at him normally.    PT Comments    Patient demonstrates slow labored movement for sitting up at bedside requiring Min assist to move BLE due to increased pain, fair/good sitting balance while completing BLE/ROM strengthening exercises while seated at bedside, and limited to a few slow labored unsteady steps at bedside due to generalized weakness and poor standing balance using RW.  Patient tolerated sitting up in chair after therapy with her daughter present in room - RN notified that patient has bloody weeping from RLE wounds.  Patient will benefit from continued physical therapy in hospital and recommended venue below to increase strength, balance, endurance for safe ADLs and gait.     Follow Up Recommendations  SNF     Equipment Recommendations  Rolling walker with 5" wheels    Recommendations for Other Services       Precautions / Restrictions Precautions Precautions: Fall Restrictions Weight Bearing Restrictions: No    Mobility  Bed Mobility Overal bed mobility: Needs Assistance Bed Mobility: Supine to Sit     Supine to sit: Min assist     General bed mobility comments: increased time, required Min assist to move BLE  Transfers Overall transfer level: Needs assistance Equipment used: Rolling walker (2 wheeled) Transfers: Sit to/from [facility] Sit to Stand: Mod assist;Max  assist Stand pivot transfers: Mod assist       General transfer comment: has difficulty for sit to stands due to BLE weakness  Ambulation/Gait Ambulation/Gait assistance: Mod assist;Max assist Gait Distance (Feet): 8 Feet Assistive device: Rolling walker (2 wheeled) Gait Pattern/deviations: Decreased step length - right;Decreased step length - left;Decreased stride length Gait velocity: decreased   General Gait Details: limited to 8-9 slow labored side steps, stepping forward/backwards due to  generalized weakness, poor standing balance   Stairs             Wheelchair Mobility    Modified Rankin (Stroke Patients Only)       Balance Overall balance assessment: Needs assistance Sitting-balance support: Feet supported;No upper extremity supported Sitting balance-Leahy Scale: Fair Sitting balance - Comments: fair/good seated at EOB   Standing balance support: During functional activity;Bilateral upper extremity supported Standing balance-Leahy Scale: Poor Standing balance comment: using RW                            Cognition Arousal/Alertness: Awake/alert Behavior During Therapy: WFL for tasks assessed/performed Overall Cognitive Status: Within Functional Limits for tasks assessed                                        Exercises General Exercises - Lower Extremity Long Arc Quad: Seated;AROM;Strengthening;Both;10 reps Hip Flexion/Marching: Seated;AROM;Strengthening;Both;10 reps Heel Raises: Seated;AROM;Strengthening;Both;10 reps    General Comments        Pertinent Vitals/Pain Pain Assessment: Faces Faces Pain Scale: Hurts little more Pain Location: BLE at wound sites Pain Descriptors / Indicators: Sore;Grimacing;Guarding Pain Intervention(s): Limited activity within patient's tolerance;Monitored during session;Repositioned    Home Living                      Prior Function            PT Goals (current goals can now be found in the care plan section) Acute Rehab PT Goals Patient Stated Goal: return home with family to assist PT Goal Formulation: With patient Time For Goal Achievement: 05/16/20 Potential to Achieve Goals: Good Progress towards PT goals: Progressing toward goals    Frequency    Min 3X/week      PT Plan Discharge plan needs to be updated    Co-evaluation              AM-PAC PT "6 Clicks" Mobility   Outcome Measure  Help needed turning from your back to your side while in a  flat bed without using bedrails?: A Little Help needed moving from lying on your back to sitting on the side of a flat bed without using bedrails?: A Lot Help needed moving to and from a bed to a chair (including a wheelchair)?: A Lot Help needed standing up from a chair using your arms (e.g., wheelchair or bedside chair)?: A Lot Help needed to walk in hospital room?: A Lot Help needed climbing 3-5 steps with a railing? : Total 6 Click Score: 12    End of Session   Activity Tolerance: Patient tolerated treatment well;Patient limited by fatigue Patient left: in chair;with call bell/phone within reach;with family/visitor present Nurse Communication: Mobility status PT Visit Diagnosis: Unsteadiness on feet (R26.81);Other abnormalities of gait and mobility (R26.89);Muscle weakness (generalized) (M62.81)     Time: 3810-1751 PT Time Calculation (min) (ACUTE ONLY): 22 min  Charges:  $Therapeutic Exercise: 8-22 mins $Therapeutic Activity: 8-22 mins                     9:20 AM, 05/03/20 Lonell Grandchild, MPT Physical Therapist with [facility] [phone] office [phone] mobile phone

## 2020-05-03 NOTE — Progress Notes (Signed)
PROGRESS NOTE   [name] [name]  QPY:[id number] DOB: [birth year] DOA: 04/27/2020 PCP: Asencion Noble, MD  Brief Narrative:  86 year old white female Prior right hemicolectomy for 1015 for colon cancer ascending colon, carotid stenosis venous stasis lower extremities Recent leg ulcers treated at outpatient wound center  Brought to the emergency room 12/9 with syncope and collapse likely secondary to dehydration, significant lower extremity pain and inability to stand-noncompliant with lower extremity wraps-has seen vascular surgery showing normal ABIs  Developed paroxysmal A. fib morning of 12/15 transferred to stepdown started on amiodarone cardiology consulted  Assessment & Plan:   Principal Problem:   AKI (acute kidney injury) ([place]) Active Problems:   Syncope and collapse   Dehydration   Moderate protein-calorie malnutrition (H[facility])   Cellulitis   HTN (hypertension)   1. New onset SVT A. fib RVR CHADS2 score >5 a. Developed SVT responsive to adenosine and carotid sinus massage b. Started on amiodarone after discussion with Dr. Harl Bowie cardiology and is converted to sinus rhythm in the stepdown unit c. Hold at this time heparin or Coumadin as is converted to NSR d. Will probably need watchman device or 30-day monitor to see if this recurs defer to cardiology planning 2. Syncope and collapse 3. AKI secondary to hydronephrosis and possibly diuretics pressure meds a. Probably secondary to volume depletion b. Slowly improving with normal saline 75 cc an hour c. Check labs a.m. d. Will probably need Foley catheter to be removed in the outpatient setting or have a voiding trial 4. Lower extremity swelling/cellulitis a. Weeping wounds on left lower extremity-continuing at this time vancomycin Zosyn and probably will de-escalate in the next several days depending on counts b. Wound care has recommended clean and apply Santyl cover with normal saline gauze and ABD with Kerlix and daily  changes 5. Likely dilutional anemia a. No evidence of acute blood loss repeat labs in the morning 6. HTN a. Hypotensive therefore blood pressure meds (HCTZ 25, losartan 100 discontinued on admission 7. Carotid stenosis 8. BMI 24  DVT prophylaxis: S subcu heparin Code Status: Full Family Communication: Long discussion at bedside with her daughter who is a nurse Disposition:  Status is: Inpatient  Remains inpatient appropriate because:Hemodynamically unstable, Persistent severe electrolyte disturbances, Ongoing diagnostic testing needed not appropriate for outpatient work up and Unsafe d/c plan   Dispo: The patient is from: Home              Anticipated d/c is to: SNF              Anticipated d/c date is: 2 days              Patient currently is not medically stable to d/c.       Consultants:   Cardiology  Procedures: None  Antimicrobials: Vancomycin Zosyn   Subjective: Events from this morning noted I was present when patient flipped into SVT I revisited the patient in stepdown unit and she seems more calm heart rate is better controlled  Objective: Vitals:   05/02/20 0554 05/02/20 2035 05/03/20 0355 05/03/20 0440  BP: (!) 119/58 (!) 126/57 (!) 106/46   Pulse: (!) 103 (!) 107 (!) 109   Resp:  18 19   Temp: 98.1 F (36.7 C) 98.5 F (36.9 C) 98.4 F (36.9 C)   TempSrc: Oral     SpO2: 93% 98% 97%   Weight:    62.2 kg  Height:        Intake/Output Summary (Last 24 hours) at 05/03/2020  [place] filed at 05/03/2020 0300 Gross per 24 hour  Intake 480 ml  Output 2100 ml  Net -1620 ml   Filed Weights   04/29/20 0530 04/30/20 0336 05/03/20 0440  Weight: 62.2 kg 66.3 kg 62.2 kg    Examination: EOMI NCAT no focal deficit Chest clear no added sound no rales no rhonchi irregularly irregular this morning Abdomen soft Chest clear Wounds to left lower extremity are noted Neurologically intact but somewhat withdrawn   Data Reviewed: I have personally  reviewed following labs and imaging studies WBC 25-->15 Hemoglobin 9.1-->7.6 Platelet 310 BUNs/creatinine 90/3.1-->73/1.6  Radiology Studies: US RENAL  Result Date: 05/02/2020 CLINICAL DATA:  Acute renal failure. EXAM: RENAL / URINARY TRACT ULTRASOUND COMPLETE COMPARISON:  CT 08/25/2013. FINDINGS: Right Kidney: Renal measurements: 11.3 x 5.0 x 6.2 cm = volume: 182.6 mL. Echogenicity within normal limits. No mass. Mild right hydronephrosis visualized. Left Kidney: Renal measurements: 11.9 x 6.4 x 6.6 cm = volume: 263.0 mL. Echogenicity within normal limits. No mass. Moderate left hydronephrosis visualized. Bladder: Mild bladder distention cannot be excluded. Other: None. IMPRESSION: 1. Mild right and moderate left hydronephrosis. 2. Mild bladder distention cannot be excluded. Electronically Signed   By: Marcello Moores  Register   On: 05/02/2020 15:47   MR TIBIA FIBULA RIGHT WO CONTRAST  Result Date: 05/02/2020 CLINICAL DATA:  Bilateral lower extremity wounds. Evaluate for deep soft tissue infection or osteomyelitis. EXAM: MRI OF LOWER LEFT EXTREMITY WITHOUT CONTRAST; MRI OF LOWER RIGHT EXTREMITY WITHOUT CONTRAST TECHNIQUE: Multiplanar, multisequence MR imaging of the bilateral lower legs suspicious was performed. No intravenous contrast was administered. COMPARISON:  None. FINDINGS: Bones/Joint/Cartilage No suspicious marrow signal abnormality. No fracture or dislocation. Small right and tiny left Baker cysts. No joint effusion. Muscles and Tendons No muscle edema. Mild atrophy of the right medial gastrocnemius muscle. Visualized bilateral flexor, extensor, peroneal, and Achilles tendons are intact. Soft tissue Skin irregularity along the anterior aspect of both distal lower legs, likely corresponding to the patient's wounds. Circumferential soft tissue swelling of both lower legs, worse on the left. No fluid collection or hematoma. No soft tissue mass. IMPRESSION: 1. Skin irregularity along the anterior  aspect of both distal lower legs, likely corresponding to the patient's wounds. No evidence of osteomyelitis or abscess. 2. Circumferential soft tissue swelling of both lower legs, worse on the left. This can be seen with cellulitis, venous insufficiency, or third spacing. Electronically Signed   By: Titus Dubin M.D.   On: 05/02/2020 17:00   MR TIBIA FIBULA LEFT WO CONTRAST  Result Date: 05/02/2020 CLINICAL DATA:  Bilateral lower extremity wounds. Evaluate for deep soft tissue infection or osteomyelitis. EXAM: MRI OF LOWER LEFT EXTREMITY WITHOUT CONTRAST; MRI OF LOWER RIGHT EXTREMITY WITHOUT CONTRAST TECHNIQUE: Multiplanar, multisequence MR imaging of the bilateral lower legs suspicious was performed. No intravenous contrast was administered. COMPARISON:  None. FINDINGS: Bones/Joint/Cartilage No suspicious marrow signal abnormality. No fracture or dislocation. Small right and tiny left Baker cysts. No joint effusion. Muscles and Tendons No muscle edema. Mild atrophy of the right medial gastrocnemius muscle. Visualized bilateral flexor, extensor, peroneal, and Achilles tendons are intact. Soft tissue Skin irregularity along the anterior aspect of both distal lower legs, likely corresponding to the patient's wounds. Circumferential soft tissue swelling of both lower legs, worse on the left. No fluid collection or hematoma. No soft tissue mass. IMPRESSION: 1. Skin irregularity along the anterior aspect of both distal lower legs, likely corresponding to the patient's wounds. No evidence of osteomyelitis or abscess.  2. Circumferential soft tissue swelling of both lower legs, worse on the left. This can be seen with cellulitis, venous insufficiency, or third spacing. Electronically Signed   By: Titus Dubin M.D.   On: 05/02/2020 17:00     Scheduled Meds: . aspirin EC  81 mg Oral Daily  . atorvastatin  20 mg Oral Daily  . Chlorhexidine Gluconate Cloth  6 each Topical Daily  . collagenase   Topical Daily   . feeding supplement  237 mL Oral BID BM  . gabapentin  300 mg Oral BID  . heparin  5,000 Units Subcutaneous Q8H  . multivitamin with minerals  1 tablet Oral Daily  . nutrition supplement (JUVEN)  1 packet Oral BID BM  . silver sulfADIAZINE  1 application Topical Daily  . tamsulosin  0.4 mg Oral Q supper   Continuous Infusions: . piperacillin-tazobactam (ZOSYN)  IV 3.375 g (05/02/20 2214)  . vancomycin 750 mg (05/01/20 2113)     LOS: 6 days    Time spent: 45 Above half of this was critical care time  CRITICAL CARE Performed by: Nita Sells   Total critical care time: 23 minutes  Critical care time was exclusive of separately billable procedures and treating other patients.  Critical care was necessary to treat or prevent imminent or life-threatening deterioration.  Critical care was time spent personally by me on the following activities: development of treatment plan with patient and/or surrogate as well as nursing, discussions with consultants, evaluation of patient's response to treatment, examination of patient, obtaining history from patient or surrogate, ordering and performing treatments and interventions, ordering and review of laboratory studies, ordering and review of radiographic studies, pulse oximetry and re-evaluation of patient's condition.   Nita Sells, MD [facility] To contact the attending provider between 7A-7P or the covering provider during after hours 7P-7A, please log into the web site [email or web address] and access using universal [place] password for that web site. If you do not have the password, please call the hospital operator.  05/03/2020, 7:46 AM

## 2020-05-03 NOTE — Telephone Encounter (Signed)
Patient's family called she is in ICU and they will call back to r/s once she is d/c from hospital.

## 2020-05-03 NOTE — Progress Notes (Signed)
Brief visit of spiritual support. Ms Coven was sleeping and offered support to [facility], her daughter who is known to me due to past work in this ICU. Will continue to offer support.

## 2020-05-03 NOTE — Consult Note (Addendum)
Cardiology Consult    Patient ID: Rylynn Kobs; [id number]; [birth year]   Admit date: 04/27/2020 Date of Consult: 05/03/2020  Primary Care Provider: Asencion Noble, MD Primary Cardiologist: New to [facility] - Dr. Harl Bowie  Patient Profile    [name] [name] is a 86 y.o. female with past medical history of HTN, HLD, venous stasis ulcers (normal ABI's per Vascular), carotid artery stenosis and history of colon cancer who is being seen today for the evaluation of atrial fibrillation with RVR at the request of Dr. Verlon Au.   History of Present Illness    Ms. Muehl presented to [facility] ED on 04/27/2020 after suffering a syncopal episode. Was found to be hypotensive upon arrival with BP at 98/81. Notes mention she was orthostatic but I am unable to locate the exact numbers.   Initial labs showed WBC 17.6, Hgb 10.5, platelets 364, Na+ 135, K+ 4.2 and creatinine 3.12 (previously 0.81 in 2015). COVID negative. Sed rate 120. CRP 9.9. Blood cultures negative. CXR with no acute abnormalities. CT Head with no acute abnormalities. Carotid dopplers showed 50-69% RICA stenosis and possible 97-02% LICA stenosis. Renal US showed mild right and moderate left hydronephrosis. Echocardiogram showed a preserved EF of > 75% with no regional WMA. She did have moderate LVH and Grade 1 DD along with normal RV function and no significant valve abnormalities.    At the time of admission, she was started on IVF given her hypotension and AKI. PTA Losartan 172m and HCTZ 283mdaily were discontinued. Given wounds along her lower extremities and elevated WBC count, she was started on IV Vancomycin. Renal function has been improving this admission and was at 2.21 on 05/02/2020. She was noted to have urinary retention and a Foley was placed with creatinine improved to 1.62 this AM.   This morning, she was noted to be in sinus tachycardia with HR in the 110's but around 0900 she went into atrial fibrillation with RVR and HR into the 180's.  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 Was getting ready to eat breakfast when her telemetry started to show abnormalities and Rapid Response was called. Feels like her breathing is at baseline and denies any chest pain or palpitations. She is unable to recall the events surrounding her fall the day of admission and this was unwitnessed. The patient denies any prior falls. No known history of CAD, CHF or cardiac arrhythmias. She is followed by Vascular Surgery for her carotid artery stenosis.    Past Medical History:  Diagnosis Date  . Arthritis    hands  . Carotid artery occlusion   . Colon cancer (HCC) [place] Headache(784.0)    occassional headaches  . Hypertension   . Malignant neoplasm of colon (HCC) [place] Medical history non-contributory     Past Surgical History:  Procedure Laterality Date  . ABDOMINAL HYSTERECTOMY    . APPENDECTOMY    . COLONOSCOPY N/A 08/26/2013   Procedure: COLONOSCOPY;  Surgeon: NajeeRogene Houston  [facility]: [facility];  Service: Endoscopy;  Laterality: N/A;  . COLONOSCOPY N/A 11/24/2014   Procedure: COLONOSCOPY;  Surgeon: NajeeRogene Houston  Location: [facility];  Service:  Endoscopy;  Laterality: N/A;  1200  . COLONOSCOPY N/A 03/19/2018   Procedure: COLONOSCOPY;  Surgeon: Rogene Houston, MD;  Location: [facility];  Service: Endoscopy;  Laterality: N/A;  830  . FRACTURE SURGERY     wrist  . INTRAOCULAR LENS IMPLANT, SECONDARY Bilateral 2007   bilateral  . PARTIAL COLECTOMY N/A 08/27/2013   Procedure: RIGHT HEMICOLECTOMY;  Surgeon: Jamesetta So, MD;  Location: [facility] ORS;  Service: General;  Laterality: N/A;  . POLYPECTOMY  03/19/2018   Procedure:  POLYPECTOMY;  Surgeon: Rogene Houston, MD;  Location: [facility];  Service: Endoscopy;;  colon  . TONSILLECTOMY       Home Medications:  Prior to Admission medications   Medication Sig Start Date End Date Taking? Authorizing Provider  acetaminophen (TYLENOL) 500 MG tablet Take 1,000 mg by mouth daily as needed for moderate pain or headache.   Yes [provider]  aspirin EC 81 MG tablet Take 1 tablet (81 mg total) by mouth daily. 03/20/18  Yes Rehman, Mechele Dawley, MD  atorvastatin (LIPITOR) 20 MG tablet Take 20 mg by mouth daily.   Yes [provider]  docusate sodium (COLACE) 100 MG capsule Take 200 mg by mouth as needed for mild constipation.    Yes [provider]  gabapentin (NEURONTIN) 100 MG capsule Take 100 mg by mouth 2 (two) times daily. 03/30/20  Yes [provider]  hydrochlorothiazide (HYDRODIURIL) 25 MG tablet Take 25 mg by mouth daily. 04/18/20  Yes [provider]  losartan (COZAAR) 100 MG tablet Take 100 mg by mouth at bedtime. 01/19/20  Yes [provider]  Polyvinyl Alcohol-Povidone (REFRESH OP) Place 1 drop into both eyes daily as needed (dry eyes).   Yes [provider]  silver sulfADIAZINE (SILVADENE) 1 % cream Apply 1 application topically daily. 04/17/20  Yes Early, Arvilla Meres, MD  Wheat Dextrin (BENEFIBER DRINK MIX) PACK Take 4 g by mouth at bedtime. 03/19/18  Yes Rehman, Mechele Dawley, MD    Inpatient Medications: Scheduled Meds: . aspirin EC  81 mg Oral Daily  . atorvastatin  20 mg Oral Daily  . Chlorhexidine Gluconate Cloth  6 each Topical Daily  . collagenase   Topical Daily  . feeding supplement  237 mL Oral BID BM  . gabapentin  300 mg Oral BID  . heparin  5,000 Units Subcutaneous Q8H  . multivitamin with minerals  1 tablet Oral Daily  . nutrition supplement (JUVEN)  1 packet Oral BID BM  . silver sulfADIAZINE  1 application Topical Daily  . tamsulosin  0.4 mg Oral Q supper   Continuous Infusions: .  sodium chloride 75 mL/hr at 05/03/20 1047  . amiodarone 60 mg/hr (05/03/20 1047)   Followed by  . amiodarone    . piperacillin-tazobactam (ZOSYN)  IV Stopped (05/03/20 0204)  . vancomycin Stopped (05/01/20 2213)   PRN Meds: acetaminophen **OR** acetaminophen, hydrALAZINE, morphine injection, ondansetron **OR** ondansetron (ZOFRAN) IV, oxyCODONE, polyethylene glycol  Allergies:   No Known Allergies  Social History:   Social History   Socioeconomic History  . Marital status: Married    Spouse name: Not on file  . Number of children: Not on file  . Years of education: Not on file  . Highest education level: Not on file  Occupational History  . Not on file  Tobacco Use  . Smoking status: Former Smoker    Packs/day: 0.50    Years: 15.00    Pack years: 7.50  Types: Cigarettes  . Smokeless tobacco: Never Used  Vaping Use  . Vaping Use: Never used  Substance and Sexual Activity  . Alcohol use: No  . Drug use: No  . Sexual activity: Not on file  Other Topics Concern  . Not on file  Social History Narrative  . Not on file   Social Determinants of Health   Financial Resource Strain: Not on file  Food Insecurity: Not on file  Transportation Needs: Not on file  Physical Activity: Not on file  Stress: Not on file  Social Connections: Not on file  Intimate Partner Violence: Not on file     Family History:    Family History  Problem Relation Age of Onset  . Hypertension Mother   . Heart disease Mother   . Cancer Brother   . Colon cancer Neg Hx       Review of Systems    General:  No chills, fever, night sweats or weight changes. Positive for fall.  Cardiovascular:  No chest pain, dyspnea on exertion, edema, orthopnea, palpitations, paroxysmal nocturnal dyspnea. Dermatological: No rash, lesions/masses Respiratory: No cough, dyspnea Urologic: No hematuria, dysuria Abdominal:   No nausea, vomiting, diarrhea, bright red blood per rectum, melena, or  hematemesis Neurologic:  No visual changes, wkns, changes in mental status. All other systems reviewed and are otherwise negative except as noted above.  Physical Exam/Data    Vitals:   05/03/20 0355 05/03/20 0440 05/03/20 0908 05/03/20 1000  BP: (!) 106/46  (!) 96/59 (!) 113/51  Pulse: (!) 109  (!) 150 96  Resp: 19   19  Temp: 98.4 F (36.9 C)  98 F (36.7 C)   TempSrc:   Oral   SpO2: 97%  100% 100%  Weight:  62.2 kg    Height:        Intake/Output Summary (Last 24 hours) at 05/03/2020 1125 Last data filed at 05/03/2020 1047 Gross per 24 hour  Intake 1025.3 ml  Output 2100 ml  Net -1074.7 ml   Filed Weights   04/29/20 0530 04/30/20 0336 05/03/20 0440  Weight: 62.2 kg 66.3 kg 62.2 kg   Body mass index is 24.29 kg/m.   General: Pleasant, elderly female appearing in NAD Psych: Normal affect. Neuro: Alert and oriented X 3. Moves all extremities spontaneously. HEENT: Normal  Neck: Supple without bruits or JVD. Lungs:  Resp regular and unlabored, scattered rhonchi throughout. Heart: RRR no s3, s4, 2/6 SEM along RUSB. Abdomen: Soft, non-tender, non-distended, BS + x 4.  Extremities: No clubbing. 1+ pitting edema and dressings in place. DP/PT/Radials 2+ and equal bilaterally.   EKG:  The EKG from 04/27/2020 was personally reviewed and demonstrates: NSR, HR 93 with sinus arrhythmia. Nonspecific ST abnormality along the lateral leads.     Labs/Studies     Relevant CV Studies:  Echocardiogram: 04/28/2020 IMPRESSIONS    1. Left ventricular ejection fraction, by estimation, is >75%. The left  ventricle has hyperdynamic function. The left ventricle has no regional  wall motion abnormalities. There is moderate left ventricular hypertrophy.  Left ventricular diastolic  parameters are consistent with Grade I diastolic dysfunction (impaired  relaxation). Elevated left atrial pressure.  2. Right ventricular systolic function is normal. The right ventricular  size is  normal.  3. The mitral valve is normal in structure. No evidence of mitral valve  regurgitation. No evidence of mitral stenosis. Moderate mitral annular  calcification.  4. The aortic valve has an indeterminant number of cusps. There is  moderate calcification of the aortic valve. There is moderate thickening  of the aortic valve. Aortic valve regurgitation is not visualized. No  aortic stenosis is present.  5. The inferior vena cava is normal in size with greater than 50%  respiratory variability, suggesting right atrial pressure of 3 mmHg.   Laboratory Data:  Chemistry Recent Labs  Lab 2020/05/24 0613 05/03/20 0553 05/03/20 0929  NA 139 138 138  K 4.0 3.5 3.5  CL 112* 113* 113*  CO2 15* 17* 16*  GLUCOSE 111* 119* 124*  BUN 82* 73* 68*  CREATININE 2.21* 1.62* 1.48*  CALCIUM 8.4* 7.8* 7.9*  GFRNONAA 21* 31* 34*  ANIONGAP '12 8 9    ' Recent Labs  Lab 04/30/20 0647 05/01/20 0626 05/03/20 0929  PROT 6.1* 5.7* 5.5*  ALBUMIN 1.7* 1.6* 1.5*  AST 27 20 43*  ALT '17 15 29  ' ALKPHOS 66 64 108  BILITOT 0.6 0.7 0.4   Hematology Recent Labs  Lab 05/01/20 0626 May 24, 2020 0613 05/03/20 0553  WBC 24.4* 25.0* 15.0*  RBC 2.92* 3.02* 2.52*  HGB 8.9* 9.1* 7.6*  HCT 28.7* 30.2* 24.4*  MCV 98.3 100.0 96.8  MCH 30.5 30.1 30.2  MCHC 31.0 30.1 31.1  RDW 17.6* 18.0* 17.4*  PLT 302 371 310   Cardiac EnzymesNo results for input(s): TROPONINI in the last 168 hours. No results for input(s): TROPIPOC in the last 168 hours.  BNPNo results for input(s): BNP, PROBNP in the last 168 hours.  DDimer No results for input(s): DDIMER in the last 168 hours.  Radiology/Studies:  US RENAL  Result Date: 05-24-20 CLINICAL DATA:  Acute renal failure. EXAM: RENAL / URINARY TRACT ULTRASOUND COMPLETE COMPARISON:  CT 08/25/2013. FINDINGS: Right Kidney: Renal measurements: 11.3 x 5.0 x 6.2 cm = volume: 182.6 mL. Echogenicity within normal limits. No mass. Mild right hydronephrosis visualized. Left  Kidney: Renal measurements: 11.9 x 6.4 x 6.6 cm = volume: 263.0 mL. Echogenicity within normal limits. No mass. Moderate left hydronephrosis visualized. Bladder: Mild bladder distention cannot be excluded. Other: None. IMPRESSION: 1. Mild right and moderate left hydronephrosis. 2. Mild bladder distention cannot be excluded. Electronically Signed   By: Marcello Moores  Register   On: 05/24/20 15:47   MR TIBIA FIBULA RIGHT WO CONTRAST  Result Date: 05-24-2020 CLINICAL DATA:  Bilateral lower extremity wounds. Evaluate for deep soft tissue infection or osteomyelitis. EXAM: MRI OF LOWER LEFT EXTREMITY WITHOUT CONTRAST; MRI OF LOWER RIGHT EXTREMITY WITHOUT CONTRAST TECHNIQUE: Multiplanar, multisequence MR imaging of the bilateral lower legs suspicious was performed. No intravenous contrast was administered. COMPARISON:  None. FINDINGS: Bones/Joint/Cartilage No suspicious marrow signal abnormality. No fracture or dislocation. Small right and tiny left Baker cysts. No joint effusion. Muscles and Tendons No muscle edema. Mild atrophy of the right medial gastrocnemius muscle. Visualized bilateral flexor, extensor, peroneal, and Achilles tendons are intact. Soft tissue Skin irregularity along the anterior aspect of both distal lower legs, likely corresponding to the patient's wounds. Circumferential soft tissue swelling of both lower legs, worse on the left. No fluid collection or hematoma. No soft tissue mass. IMPRESSION: 1. Skin irregularity along the anterior aspect of both distal lower legs, likely corresponding to the patient's wounds. No evidence of osteomyelitis or abscess. 2. Circumferential soft tissue swelling of both lower legs, worse on the left. This can be seen with cellulitis, venous insufficiency, or third spacing. Electronically Signed   By: Titus Dubin M.D.   On: 24-May-2020 17:00   MR TIBIA FIBULA LEFT WO CONTRAST  Result Date:  05/02/2020 CLINICAL DATA:  Bilateral lower extremity wounds. Evaluate for  deep soft tissue infection or osteomyelitis. EXAM: MRI OF LOWER LEFT EXTREMITY WITHOUT CONTRAST; MRI OF LOWER RIGHT EXTREMITY WITHOUT CONTRAST TECHNIQUE: Multiplanar, multisequence MR imaging of the bilateral lower legs suspicious was performed. No intravenous contrast was administered. COMPARISON:  None. FINDINGS: Bones/Joint/Cartilage No suspicious marrow signal abnormality. No fracture or dislocation. Small right and tiny left Baker cysts. No joint effusion. Muscles and Tendons No muscle edema. Mild atrophy of the right medial gastrocnemius muscle. Visualized bilateral flexor, extensor, peroneal, and Achilles tendons are intact. Soft tissue Skin irregularity along the anterior aspect of both distal lower legs, likely corresponding to the patient's wounds. Circumferential soft tissue swelling of both lower legs, worse on the left. No fluid collection or hematoma. No soft tissue mass. IMPRESSION: 1. Skin irregularity along the anterior aspect of both distal lower legs, likely corresponding to the patient's wounds. No evidence of osteomyelitis or abscess. 2. Circumferential soft tissue swelling of both lower legs, worse on the left. This can be seen with cellulitis, venous insufficiency, or third spacing. Electronically Signed   By: Titus Dubin M.D.   On: 05/02/2020 17:00     Assessment & Plan    1. Atrial Fibrillation with RVR - New diagnosis for the patient this admission. She was asymptomatic with her episode this AM. Was started on IV Amiodarone given soft BP and she did convert back to NSR around 0945 following IV Amiodarone loading dose. Would anticipate continuing IV Amiodarone until tomorrow then convert to PO loading dose. No BB or CCB for now given soft BP.  - Mg 2.3 and K+ 3.5. Will check TSH. Echo earlier this admission showed a preserved EF as outlined above. - This patients CHA2DS2-VASc Score and unadjusted Ischemic Stroke Rate (% per year) is equal to 7.2 % stroke rate/year from a score  of 5 (HTN, Vascular, Female, Age (2)). At this time, she has not been started on anticoagulation given her brief episode and quick return to NSR. If found to have recurrence, would recommend initiation of anticoagulation following work-up of her anemia. This was reviewed with the patient's daughter as well.   2. HTN - Actually hypotensive this admission. BP has been stable at 106/64 - 126/57 over the past 24 hours. PTA Losartan and HCTZ have been discontinued.   3. Carotid Artery Stenosis - Carotid dopplers showed 50-69% RICA stenosis and possible 56-38% LICA stenosis. Followed by Vascular Surgery as an outpatient. Remains on ASA 50m daily and Atorvastatin 277mdaily.   4. Anemia - Hgb at 10.5 on admission, down to 7.6 today. Further evaluation per admitting team.   5. Syncopal Episode - Unclear etiology but likely due to orthostasis given her AKI on admission. Reports feeling back to baseline at this time.   6. Venous Stasis Ulcers - She remains on Zosyn and is being followed by Wound Care. Further management per admitting team.     For questions or updates, please contact [facility] consult [email or web address] for contact info under Cardiology/STEMI.  Signed, BrErma HeritagePA-C 05/03/2020, 11:25 AM Pager: [phone] seen and discussed with PA StAhmed PrimaI agree with her documentation. [age]o female history of carotid disease, HTN, venous stasis admitted with syncope. Found to be significantly hypovolemic with resulting AKI, hypotension that have improved with IVFs. Being treated for significant leukocytosis and fever secondary to chronic leg wounds managed by primary team. THis morning had sudden onset of tachycardia, EKG reviewed shows afib,  a new diagnosis for patient. Cardiology consulted to help manage new onset afib.    Cr 3.1 BUN 90 WBC 17.6 Hgb 10.5 Plt 364 Mg 2.6 ESR 120 CRP 9.9 Prealbumin Trop 13--> COVID neg CXR no acute process CT head no acute  process Carotid US: RICA 44-01%, LICA probable 02-72% though tortuous Echo: LVEF >75%, grade I DDx   AFib new diagnosis this admission. Unclear if longer term issue or if potentially isolated in the setting of significant systemic illness. GIven low bp's she was initially started on IV amiodarone. Would plan for short term of amio, likely transition to potentially closer to discharge or at f/u and pending bp's. With downtrending Hgb and short episode of afib hold on anticoag at this time.    Leukocytosis WBC up to 31, being treated for bilateral leg wound/infections.   AKI initial Cr 3.1, improved with IVFs alone. LIkely prerenal from poor oral intake and being on HCTZ at home.   Malnutrition, prealbumin 16.3  Anemia Hgb down to 7.6, likely hemoconcentrated on admission as opposed to rapid loss during admission though absolute number is low.   Syncope likely orthostatic given she presented hypovolemic  [name] [name]

## 2020-05-03 NOTE — Progress Notes (Signed)
Received patient from [facility] on 300. Patient was started on amiodarone. Bolus given. Patient is rate controlled in the 80s to 90s.

## 2020-05-04 ENCOUNTER — Ambulatory Visit (HOSPITAL_COMMUNITY): Payer: Medicare Other | Admitting: Physical Therapy

## 2020-05-04 DIAGNOSIS — E876 Hypokalemia: Secondary | ICD-10-CM

## 2020-05-04 LAB — CBC WITH DIFFERENTIAL/PLATELET
Abs Immature Granulocytes: 1.16 10*3/uL — ABNORMAL HIGH (ref 0.00–0.07)
Basophils Absolute: 0.1 10*3/uL (ref 0.0–0.1)
Basophils Relative: 0 %
Eosinophils Absolute: 0.5 10*3/uL (ref 0.0–0.5)
Eosinophils Relative: 3 %
HCT: 23.7 % — ABNORMAL LOW (ref 36.0–46.0)
Hemoglobin: 7.3 g/dL — ABNORMAL LOW (ref 12.0–15.0)
Immature Granulocytes: 8 %
Lymphocytes Relative: 15 %
Lymphs Abs: 2.1 10*3/uL (ref 0.7–4.0)
MCH: 30 pg (ref 26.0–34.0)
MCHC: 30.8 g/dL (ref 30.0–36.0)
MCV: 97.5 fL (ref 80.0–100.0)
Monocytes Absolute: 0.9 10*3/uL (ref 0.1–1.0)
Monocytes Relative: 6 %
Neutro Abs: 9.8 10*3/uL — ABNORMAL HIGH (ref 1.7–7.7)
Neutrophils Relative %: 68 %
Platelets: 290 10*3/uL (ref 150–400)
RBC: 2.43 MIL/uL — ABNORMAL LOW (ref 3.87–5.11)
RDW: 17.6 % — ABNORMAL HIGH (ref 11.5–15.5)
WBC: 14.5 10*3/uL — ABNORMAL HIGH (ref 4.0–10.5)
nRBC: 0.3 % — ABNORMAL HIGH (ref 0.0–0.2)

## 2020-05-04 LAB — COMPREHENSIVE METABOLIC PANEL
ALT: 41 U/L (ref 0–44)
AST: 61 U/L — ABNORMAL HIGH (ref 15–41)
Albumin: 1.2 g/dL — ABNORMAL LOW (ref 3.5–5.0)
Alkaline Phosphatase: 113 U/L (ref 38–126)
Anion gap: 6 (ref 5–15)
BUN: 58 mg/dL — ABNORMAL HIGH (ref 8–23)
CO2: 18 mmol/L — ABNORMAL LOW (ref 22–32)
Calcium: 7.5 mg/dL — ABNORMAL LOW (ref 8.9–10.3)
Chloride: 116 mmol/L — ABNORMAL HIGH (ref 98–111)
Creatinine, Ser: 1.23 mg/dL — ABNORMAL HIGH (ref 0.44–1.00)
GFR, Estimated: 43 mL/min — ABNORMAL LOW (ref >60)
Glucose, Bld: 130 mg/dL — ABNORMAL HIGH (ref 70–99)
Potassium: 3.2 mmol/L — ABNORMAL LOW (ref 3.5–5.1)
Sodium: 140 mmol/L (ref 135–145)
Total Bilirubin: 0.3 mg/dL (ref 0.3–1.2)
Total Protein: 4.7 g/dL — ABNORMAL LOW (ref 6.5–8.1)

## 2020-05-04 LAB — AEROBIC CULTURE W GRAM STAIN (SUPERFICIAL SPECIMEN)

## 2020-05-04 LAB — CULTURE, BLOOD (ROUTINE X 2)
Culture: NO GROWTH
Culture: NO GROWTH
Special Requests: ADEQUATE
Special Requests: ADEQUATE

## 2020-05-04 LAB — TSH: TSH: 2.723 u[IU]/mL (ref 0.350–4.500)

## 2020-05-04 MED ORDER — POTASSIUM CHLORIDE 10 MEQ/100ML IV SOLN
10.0000 meq | INTRAVENOUS | Status: AC
  Administered 2020-05-04 (×4): 10 meq via INTRAVENOUS
  Filled 2020-05-04 (×4): qty 100

## 2020-05-04 MED ORDER — AMIODARONE HCL 200 MG PO TABS
200.0000 mg | ORAL_TABLET | Freq: Two times a day (BID) | ORAL | Status: DC
  Administered 2020-05-04 – 2020-05-06 (×5): 200 mg via ORAL
  Filled 2020-05-04 (×5): qty 1

## 2020-05-04 NOTE — Progress Notes (Signed)
PT Cancellation Note  Patient Details Name: [name] [name] MRN: [id number] DOB: [birth year]   Cancelled Treatment:    Reason Eval/Treat Not Completed: Medical issues which prohibited therapy.  Patient transferred to a higher level of care and will need new PT consult resume therapy when patient is medically stable.  Thank you.   7:35 AM, 05/04/20 Lonell Grandchild, MPT Physical Therapist with [facility] [phone] office [phone] mobile phone

## 2020-05-04 NOTE — Progress Notes (Signed)
Physical Therapy Treatment Patient Details Name: [name] [name] MRN: [id number] DOB: [birth year] Today's Date: 05/04/2020    History of Present Illness [name] [name]  is a 86 y.o. female, of hypertension, carotid artery occlusion, hypertension, venous stasis changes of lower extremities, presents to the ED with a chief complaint of syncope. And a lot of pain and only intermittently answers questions secondary to the pain asking "please help me God," so husband at bedside provides most of the history. He reports that patient had got up from the recliner chair walk to the kitchen to make her self a cup of coffee, before she was able to get a coffee cup she felt herself falling backwards and could not stop it. This is what she told him about the event. It was unwitnessed. Patient reports that she did not have chest pain, palpitations, shortness of breath before the event. She is not sure if she hit her head. Neither of them are sure if she lost consciousness as he was in the back bedroom and did not hear her fall, only heard her calling for him after she was alert. He reports that she was immediately alert and oriented. She was not able to get up on her own, so he got her up and took her to the recliner chair. After some rest, patient was able to get up and walk around. She even did some household chores and went up and down stairs. It was time for them to go to their appointment at the independent wound care when he noted that she was not talking right, and not looking at him right. He suspected that she had had a stroke. He reports that he does not hear very well, so he cannot describe how she was not talking right. He said it was not confusion, and he does not think she was slurring her speech, but that something was wrong with her voice. He also cannot further explain how she was not looking at him right, he just thought it was not normal. So they called Dr. Ria Comment office who recommended he come into the ER. The  time of my exam he reports that patient is talking correctly, and looking at him normally.    PT Comments    REASSESSMENT:  Patient presents seated in chair (assisted by nursing staff/family member) and agreeable for therapy.  Patient instructed in and given written instructions for HEP with fair/good carryover, very unsteady on feet and limited to taking steps in room, has mild difficulty completing sit to stands/transferrs due generalized weakness, mostly limited for ambulation due to HR increasing up to 140 BPM and c/o of fatigue.  Patient put back to bed after therapy with her daughter present in room.  Patient will benefit from continued physical therapy in hospital and recommended venue below to increase strength, balance, endurance for safe ADLs and gait with current treatment plan and goals.   Follow Up Recommendations  SNF;Supervision for mobility/OOB;Supervision - Intermittent     Equipment Recommendations  Rolling walker with 5" wheels    Recommendations for Other Services       Precautions / Restrictions Precautions Precautions: Fall Restrictions Weight Bearing Restrictions: No    Mobility  Bed Mobility Overal bed mobility: Needs Assistance Bed Mobility: Sit to Supine     Supine to sit: Min assist;Mod assist     General bed mobility comments: increased time, required Min/mod assist to move BLE back onto bed  Transfers Overall transfer level: Needs assistance Equipment used: Rolling walker (  2 wheeled) Transfers: Sit to/from [facility] Sit to Stand: Mod assist Stand pivot transfers: Mod assist       General transfer comment: slow unsteady labored movement  Ambulation/Gait Ambulation/Gait assistance: Mod assist Gait Distance (Feet): 15 Feet Assistive device: Rolling walker (2 wheeled) Gait Pattern/deviations: Decreased step length - right;Decreased step length - left;Decreased stride length Gait velocity: decreased   General Gait Details:  demonstrates slow labored unsteady cadenc with verbal cues to extend back with fair/good carryover, limited secondary to fatigue and poor standing balance   Stairs             Wheelchair Mobility    Modified Rankin (Stroke Patients Only)       Balance Overall balance assessment: Needs assistance Sitting-balance support: Feet supported;No upper extremity supported Sitting balance-Leahy Scale: Fair Sitting balance - Comments: fair/good seated at EOB   Standing balance support: During functional activity;Bilateral upper extremity supported Standing balance-Leahy Scale: Poor Standing balance comment: using RW                            Cognition Arousal/Alertness: Awake/alert Behavior During Therapy: WFL for tasks assessed/performed Overall Cognitive Status: Within Functional Limits for tasks assessed                                        Exercises General Exercises - Lower Extremity Long Arc Quad: Seated;AROM;Strengthening;Both;15 reps Hip Flexion/Marching: Seated;AROM;Strengthening;Both;15 reps Toe Raises: Seated;AROM;Strengthening;Both;15 reps Heel Raises: Seated;AROM;Strengthening;Both;15 reps    General Comments        Pertinent Vitals/Pain Pain Assessment: Faces Faces Pain Scale: Hurts a little bit Pain Location: BLE at wound sites Pain Descriptors / Indicators: Sore;Grimacing;Guarding Pain Intervention(s): Limited activity within patient's tolerance;Monitored during session;Repositioned    Home Living                      Prior Function            PT Goals (current goals can now be found in the care plan section) Acute Rehab PT Goals Patient Stated Goal: return home with family to assist PT Goal Formulation: With patient/family Time For Goal Achievement: 05/16/20 Potential to Achieve Goals: Good Progress towards PT goals: Progressing toward goals    Frequency    Min 3X/week      PT Plan Current plan  remains appropriate    Co-evaluation              AM-PAC PT "6 Clicks" Mobility   Outcome Measure  Help needed turning from your back to your side while in a flat bed without using bedrails?: A Little Help needed moving from lying on your back to sitting on the side of a flat bed without using bedrails?: A Lot Help needed moving to and from a bed to a chair (including a wheelchair)?: A Lot Help needed standing up from a chair using your arms (e.g., wheelchair or bedside chair)?: A Lot Help needed to walk in hospital room?: A Lot Help needed climbing 3-5 steps with a railing? : Total 6 Click Score: 12    End of Session   Activity Tolerance: Patient tolerated treatment well;Patient limited by fatigue Patient left: in bed;with call bell/phone within reach;with family/visitor present Nurse Communication: Mobility status PT Visit Diagnosis: Unsteadiness on feet (R26.81);Other abnormalities of gait and mobility (R26.89);Muscle weakness (generalized) (M62.81)  Time: [id number] PT Time Calculation (min) (ACUTE ONLY): 22 min  Charges:  $Therapeutic Exercise: 8-22 mins $Therapeutic Activity: 8-22 mins                     4:02 PM, 05/04/20 Lonell Grandchild, MPT Physical Therapist with [facility] [phone] office [phone] mobile phone

## 2020-05-04 NOTE — Progress Notes (Signed)
PROGRESS NOTE   [name] [name]  VFI:[id number] DOB: 11-Dec-1931 DOA: 04/27/2020 PCP: Asencion Noble, MD  Brief Narrative:  86 year old white female Prior right hemicolectomy for 1015 for colon cancer ascending colon, carotid stenosis venous stasis lower extremities Recent leg ulcers treated at outpatient wound center  Brought to the emergency room 12/9 with syncope and collapse likely secondary to dehydration, significant lower extremity pain and inability to stand-noncompliant with lower extremity wraps-has seen vascular surgery showing normal ABIs  Developed paroxysmal A. fib morning of 12/15 transferred to stepdown started on amiodarone cardiology consulted  Assessment & Plan:   Principal Problem:   AKI (acute kidney injury) ([place]) Active Problems:   Syncope and collapse   Dehydration   Moderate protein-calorie malnutrition (HCC)   Cellulitis   HTN (hypertension)   1. New onset SVT A. fib RVR CHADS2 score >5 a. Developed SVT responsive to adenosine and carotid sinus massage b. Started on amiodarone after discussion with Dr. Harl Bowie cardiology and is converted to sinus rhythm in the stepdown unit--now converted to PO amio 200 bid c. Defer long term AC to Cardiology 2. Syncope and collapse 3. AKI secondary to hydronephrosis and possibly diuretics pressure meds 4. hypokalemia a. Probably secondary to volume depletion b. Slowly improving with normal saline 75 cc an hour--will NS lock today c. Check labs a.m. d. Will probably need Foley catheter to be removed in the outpatient setting or have a voiding trial e. Replace potassium with 4 runs  5. Lower extremity swelling/cellulitis a. Weeping wounds on left lower extremity-changing IV to PO Augmentin b. wathc for elevated Tmax c. Wound care has recommended clean and apply Santyl cover with normal saline gauze and ABD with Kerlix and daily changes 6. Likely dilutional anemia a. No evidence of acute blood loss repeat labs in the  morning b. Might need trasnfusion if lower than 7.0 7. HTN a. Hypotensive therefore blood pressure meds (HCTZ 25, losartan 100 discontinued on admission 8. Carotid stenosis 9. BMI 24  DVT prophylaxis: S subcu heparin Code Status: Full Family Communication: Long discussion at bedside with her daughter who is a nurse Disposition:  Status is: Inpatient  Remains inpatient appropriate because:Hemodynamically unstable, Persistent severe electrolyte disturbances, Ongoing diagnostic testing needed not appropriate for outpatient work up and Unsafe d/c plan   Dispo: The patient is from: Home              Anticipated d/c is to: Home with hh              Anticipated d/c date is: 1 day              Patient currently is not medically stable to d/c.       Consultants:   Cardiology  Procedures: None  Antimicrobials: Vancomycin Zosyn   Subjective: Awake much more coherent in nad no focal deficit eating and drinking No cp no fever Walked in room today  Objective: Vitals:   05/04/20 0815 05/04/20 0830 05/04/20 0845 05/04/20 1145  BP: (!) 146/46 (!) 137/44 (!) 152/52   Pulse: 86 87 87   Resp: 18 18 19    Temp:    98.5 F (36.9 C)  TempSrc:    Oral  SpO2: 97% 97% 100%   Weight:      Height:        Intake/Output Summary (Last 24 hours) at 05/04/2020 1406 Last data filed at 05/04/2020 1145 Gross per 24 hour  Intake 799.69 ml  Output 2325 ml  Net -1525.31 ml  Filed Weights   04/30/20 0336 05/03/20 0440 05/04/20 0524  Weight: 66.3 kg 62.2 kg 70.2 kg    Examination: EOMI NCAT no focal deficit Chest clear  s1 s2 sinus tach on monitors Abdomen soft Wounds to left lower extremity not reviewed today Neurologically intact but somewhat withdrawn   Data Reviewed: I have personally reviewed following labs and imaging studies WBC 25-->15-->14.5 Hemoglobin 9.1-->7.6-->7.3 K 3.2 BUNs/creatinine 90/3.1-->73/1.6-->58/1.23  Radiology Studies: US RENAL  Result Date:  05/04/2020 CLINICAL DATA:  Acute renal failure. EXAM: RENAL / URINARY TRACT ULTRASOUND COMPLETE COMPARISON:  CT 08/25/2013. FINDINGS: Right Kidney: Renal measurements: 11.3 x 5.0 x 6.2 cm = volume: 182.6 mL. Echogenicity within normal limits. No mass. Mild right hydronephrosis visualized. Left Kidney: Renal measurements: 11.9 x 6.4 x 6.6 cm = volume: 263.0 mL. Echogenicity within normal limits. No mass. Moderate left hydronephrosis visualized. Bladder: Mild bladder distention cannot be excluded. Other: None. IMPRESSION: 1. Mild right and moderate left hydronephrosis. 2. Mild bladder distention cannot be excluded. Electronically Signed   By: Marcello Moores  Register   On: 05/04/2020 15:47   MR TIBIA FIBULA RIGHT WO CONTRAST  Result Date: 05/04/20 CLINICAL DATA:  Bilateral lower extremity wounds. Evaluate for deep soft tissue infection or osteomyelitis. EXAM: MRI OF LOWER LEFT EXTREMITY WITHOUT CONTRAST; MRI OF LOWER RIGHT EXTREMITY WITHOUT CONTRAST TECHNIQUE: Multiplanar, multisequence MR imaging of the bilateral lower legs suspicious was performed. No intravenous contrast was administered. COMPARISON:  None. FINDINGS: Bones/Joint/Cartilage No suspicious marrow signal abnormality. No fracture or dislocation. Small right and tiny left Baker cysts. No joint effusion. Muscles and Tendons No muscle edema. Mild atrophy of the right medial gastrocnemius muscle. Visualized bilateral flexor, extensor, peroneal, and Achilles tendons are intact. Soft tissue Skin irregularity along the anterior aspect of both distal lower legs, likely corresponding to the patient's wounds. Circumferential soft tissue swelling of both lower legs, worse on the left. No fluid collection or hematoma. No soft tissue mass. IMPRESSION: 1. Skin irregularity along the anterior aspect of both distal lower legs, likely corresponding to the patient's wounds. No evidence of osteomyelitis or abscess. 2. Circumferential soft tissue swelling of both lower  legs, worse on the left. This can be seen with cellulitis, venous insufficiency, or third spacing. Electronically Signed   By: Titus Dubin M.D.   On: 04-May-2020 17:00   MR TIBIA FIBULA LEFT WO CONTRAST  Result Date: May 04, 2020 CLINICAL DATA:  Bilateral lower extremity wounds. Evaluate for deep soft tissue infection or osteomyelitis. EXAM: MRI OF LOWER LEFT EXTREMITY WITHOUT CONTRAST; MRI OF LOWER RIGHT EXTREMITY WITHOUT CONTRAST TECHNIQUE: Multiplanar, multisequence MR imaging of the bilateral lower legs suspicious was performed. No intravenous contrast was administered. COMPARISON:  None. FINDINGS: Bones/Joint/Cartilage No suspicious marrow signal abnormality. No fracture or dislocation. Small right and tiny left Baker cysts. No joint effusion. Muscles and Tendons No muscle edema. Mild atrophy of the right medial gastrocnemius muscle. Visualized bilateral flexor, extensor, peroneal, and Achilles tendons are intact. Soft tissue Skin irregularity along the anterior aspect of both distal lower legs, likely corresponding to the patient's wounds. Circumferential soft tissue swelling of both lower legs, worse on the left. No fluid collection or hematoma. No soft tissue mass. IMPRESSION: 1. Skin irregularity along the anterior aspect of both distal lower legs, likely corresponding to the patient's wounds. No evidence of osteomyelitis or abscess. 2. Circumferential soft tissue swelling of both lower legs, worse on the left. This can be seen with cellulitis, venous insufficiency, or third spacing. Electronically Signed   By: Gwyndolyn Saxon  Marzella Schlein M.D.   On: 05/02/2020 17:00     Scheduled Meds: . amiodarone  200 mg Oral BID  . aspirin EC  81 mg Oral Daily  . atorvastatin  20 mg Oral Daily  . Chlorhexidine Gluconate Cloth  6 each Topical Daily  . collagenase   Topical Daily  . feeding supplement  237 mL Oral BID BM  . gabapentin  300 mg Oral BID  . heparin  5,000 Units Subcutaneous Q8H  . multivitamin with  minerals  1 tablet Oral Daily  . nutrition supplement (JUVEN)  1 packet Oral BID BM  . silver sulfADIAZINE  1 application Topical Daily  . tamsulosin  0.4 mg Oral Q supper   Continuous Infusions: . sodium chloride 75 mL/hr at 05/04/20 0520  . piperacillin-tazobactam (ZOSYN)  IV 3.375 g (05/04/20 1035)  . potassium chloride 10 mEq (05/04/20 1336)  . vancomycin Stopped (05/03/20 2211)     LOS: 7 days    Time spent: 62  Nita Sells, MD [facility] To contact the attending provider between 7A-7P or the covering provider during after hours 7P-7A, please log into the web site [email or web address] and access using universal [place] password for that web site. If you do not have the password, please call the hospital operator.  05/04/2020, 2:06 PM

## 2020-05-04 NOTE — Progress Notes (Signed)
Progress Note  Patient Name: [name] [name] Date of Encounter: 05/04/2020  [facility] Cardiologist: Carlyle Dolly, MD   Subjective   No complaints  Inpatient Medications    Scheduled Meds: . aspirin EC  81 mg Oral Daily  . atorvastatin  20 mg Oral Daily  . Chlorhexidine Gluconate Cloth  6 each Topical Daily  . collagenase   Topical Daily  . feeding supplement  237 mL Oral BID BM  . gabapentin  300 mg Oral BID  . heparin  5,000 Units Subcutaneous Q8H  . multivitamin with minerals  1 tablet Oral Daily  . nutrition supplement (JUVEN)  1 packet Oral BID BM  . silver sulfADIAZINE  1 application Topical Daily  . tamsulosin  0.4 mg Oral Q supper   Continuous Infusions: . sodium chloride 75 mL/hr at 05/04/20 0520  . amiodarone 30 mg/hr (05/03/20 2226)  . piperacillin-tazobactam (ZOSYN)  IV 12.5 mL/hr at 05/03/20 2226  . vancomycin Stopped (05/03/20 2211)   PRN Meds: acetaminophen **OR** acetaminophen, hydrALAZINE, morphine injection, ondansetron **OR** ondansetron (ZOFRAN) IV, oxyCODONE, polyethylene glycol   Vital Signs    Vitals:   05/03/20 2000 05/03/20 2007 05/03/20 2300 05/04/20 0524  BP: (!) 112/33     Pulse: 78     Resp: 17     Temp:  98.5 F (36.9 C) 98.3 F (36.8 C) 98.2 F (36.8 C)  TempSrc:  Oral Axillary Axillary  SpO2: 97%     Weight:    70.2 kg  Height:        Intake/Output Summary (Last 24 hours) at 05/04/2020 0829 Last data filed at 05/04/2020 0524 Gross per 24 hour  Intake 1344.99 ml  Output 1425 ml  Net -80.01 ml   Last 3 Weights 05/04/2020 05/03/2020 04/30/2020  Weight (lbs) 154 lb 12.2 oz 137 lb 2 oz 146 lb 2.6 oz  Weight (kg) 70.2 kg 62.2 kg 66.3 kg      Telemetry    NSR - Personally Reviewed  ECG    n/a - Personally Reviewed  Physical Exam   GEN: No acute distress.   Neck: No JVD Cardiac: RRR, no murmurs, rubs, or gallops.  Respiratory: Clear to auscultation bilaterally. GI: Soft, nontender, non-distended  MS: No  edema; No deformity. Neuro:  Nonfocal  Psych: Normal affect   Labs    High Sensitivity Troponin:   Recent Labs  Lab 04/27/20 1730  TROPONINIHS 13      Chemistry Recent Labs  Lab 05/01/20 0626 05/02/20 0613 05/03/20 0553 05/03/20 0929 05/04/20 0324  NA 137   < > 138 138 140  K 3.8   < > 3.5 3.5 3.2*  CL 110   < > 113* 113* 116*  CO2 17*   < > 17* 16* 18*  GLUCOSE 102*   < > 119* 124* 130*  BUN 77*   < > 73* 68* 58*  CREATININE 2.23*   < > 1.62* 1.48* 1.23*  CALCIUM 8.2*   < > 7.8* 7.9* 7.5*  PROT 5.7*  --   --  5.5* 4.7*  ALBUMIN 1.6*  --   --  1.5* 1.2*  AST 20  --   --  43* 61*  ALT 15  --   --  29 41  ALKPHOS 64  --   --  108 113  BILITOT 0.7  --   --  0.4 0.3  GFRNONAA 21*   < > 31* 34* 43*  ANIONGAP 10   < > 8 9  6   < > = values in this interval not displayed.     Hematology Recent Labs  Lab 05/02/20 [phone] 05/03/20 0553 05/04/20 0324  WBC 25.0* 15.0* 14.5*  RBC 3.02* 2.52* 2.43*  HGB 9.1* 7.6* 7.3*  HCT 30.2* 24.4* 23.7*  MCV 100.0 96.8 97.5  MCH 30.1 30.2 30.0  MCHC 30.1 31.1 30.8  RDW 18.0* 17.4* 17.6*  PLT 371 310 290    BNPNo results for input(s): BNP, PROBNP in the last 168 hours.   DDimer No results for input(s): DDIMER in the last 168 hours.   Radiology    US RENAL  Result Date: 05/02/2020 CLINICAL DATA:  Acute renal failure. EXAM: RENAL / URINARY TRACT ULTRASOUND COMPLETE COMPARISON:  CT 08/25/2013. FINDINGS: Right Kidney: Renal measurements: 11.3 x 5.0 x 6.2 cm = volume: 182.6 mL. Echogenicity within normal limits. No mass. Mild right hydronephrosis visualized. Left Kidney: Renal measurements: 11.9 x 6.4 x 6.6 cm = volume: 263.0 mL. Echogenicity within normal limits. No mass. Moderate left hydronephrosis visualized. Bladder: Mild bladder distention cannot be excluded. Other: None. IMPRESSION: 1. Mild right and moderate left hydronephrosis. 2. Mild bladder distention cannot be excluded. Electronically Signed   By: Marcello Moores  Register   On:  05/02/2020 15:47   MR TIBIA FIBULA RIGHT WO CONTRAST  Result Date: 05/02/2020 CLINICAL DATA:  Bilateral lower extremity wounds. Evaluate for deep soft tissue infection or osteomyelitis. EXAM: MRI OF LOWER LEFT EXTREMITY WITHOUT CONTRAST; MRI OF LOWER RIGHT EXTREMITY WITHOUT CONTRAST TECHNIQUE: Multiplanar, multisequence MR imaging of the bilateral lower legs suspicious was performed. No intravenous contrast was administered. COMPARISON:  None. FINDINGS: Bones/Joint/Cartilage No suspicious marrow signal abnormality. No fracture or dislocation. Small right and tiny left Baker cysts. No joint effusion. Muscles and Tendons No muscle edema. Mild atrophy of the right medial gastrocnemius muscle. Visualized bilateral flexor, extensor, peroneal, and Achilles tendons are intact. Soft tissue Skin irregularity along the anterior aspect of both distal lower legs, likely corresponding to the patient's wounds. Circumferential soft tissue swelling of both lower legs, worse on the left. No fluid collection or hematoma. No soft tissue mass. IMPRESSION: 1. Skin irregularity along the anterior aspect of both distal lower legs, likely corresponding to the patient's wounds. No evidence of osteomyelitis or abscess. 2. Circumferential soft tissue swelling of both lower legs, worse on the left. This can be seen with cellulitis, venous insufficiency, or third spacing. Electronically Signed   By: Titus Dubin M.D.   On: 05/02/2020 17:00   MR TIBIA FIBULA LEFT WO CONTRAST  Result Date: 05/02/2020 CLINICAL DATA:  Bilateral lower extremity wounds. Evaluate for deep soft tissue infection or osteomyelitis. EXAM: MRI OF LOWER LEFT EXTREMITY WITHOUT CONTRAST; MRI OF LOWER RIGHT EXTREMITY WITHOUT CONTRAST TECHNIQUE: Multiplanar, multisequence MR imaging of the bilateral lower legs suspicious was performed. No intravenous contrast was administered. COMPARISON:  None. FINDINGS: Bones/Joint/Cartilage No suspicious marrow signal  abnormality. No fracture or dislocation. Small right and tiny left Baker cysts. No joint effusion. Muscles and Tendons No muscle edema. Mild atrophy of the right medial gastrocnemius muscle. Visualized bilateral flexor, extensor, peroneal, and Achilles tendons are intact. Soft tissue Skin irregularity along the anterior aspect of both distal lower legs, likely corresponding to the patient's wounds. Circumferential soft tissue swelling of both lower legs, worse on the left. No fluid collection or hematoma. No soft tissue mass. IMPRESSION: 1. Skin irregularity along the anterior aspect of both distal lower legs, likely corresponding to the patient's wounds. No evidence of osteomyelitis or abscess.  2. Circumferential soft tissue swelling of both lower legs, worse on the left. This can be seen with cellulitis, venous insufficiency, or third spacing. Electronically Signed   By: Titus Dubin M.D.   On: 05/02/2020 17:00    Cardiac Studies    Patient Profile     [name] [name] is a 86 y.o. female with past medical history of HTN, HLD, venous stasis ulcers (normal ABI's per Vascular), carotid artery stenosis and history of colon cancer who is being seen today for the evaluation of atrial fibrillation with RVR at the request of Dr. Verlon Au.   Assessment & Plan    1. Afib - new diagnosis this admission. Unclear if longer term issue or if potentially isolated in the setting of significant systemic illness. GIven low bp's she was initially started on IV amiodarone. Would plan for short term of amio, likely transition to potentially closer to discharge or at f/u and pending bp's  - With downtrending Hgb and short episode of afib continue to hold on anticoag at this time.   2. Hypokalemia - ordered IV KCl run  3. AKI - appears to have been prerenal due to poor oral intake, diuretic at home - resolving with IVFs alone  We will follow telemetry and chart peripherally tomorrow.    For questions or updates,  please contact [place] Please consult [email or web address] for contact info under        Signed, Carlyle Dolly, MD  05/04/2020, 8:29 AM

## 2020-05-05 LAB — COMPREHENSIVE METABOLIC PANEL
ALT: 36 U/L (ref 0–44)
AST: 39 U/L (ref 15–41)
Albumin: 1.3 g/dL — ABNORMAL LOW (ref 3.5–5.0)
Alkaline Phosphatase: 109 U/L (ref 38–126)
Anion gap: 6 (ref 5–15)
BUN: 37 mg/dL — ABNORMAL HIGH (ref 8–23)
CO2: 19 mmol/L — ABNORMAL LOW (ref 22–32)
Calcium: 7.5 mg/dL — ABNORMAL LOW (ref 8.9–10.3)
Chloride: 114 mmol/L — ABNORMAL HIGH (ref 98–111)
Creatinine, Ser: 0.82 mg/dL (ref 0.44–1.00)
GFR, Estimated: >60 mL/min (ref >60)
Glucose, Bld: 107 mg/dL — ABNORMAL HIGH (ref 70–99)
Potassium: 3.6 mmol/L (ref 3.5–5.1)
Sodium: 139 mmol/L (ref 135–145)
Total Bilirubin: 0.4 mg/dL (ref 0.3–1.2)
Total Protein: 4.8 g/dL — ABNORMAL LOW (ref 6.5–8.1)

## 2020-05-05 LAB — CBC WITH DIFFERENTIAL/PLATELET
Band Neutrophils: 4 %
Basophils Absolute: 0 10*3/uL (ref 0.0–0.1)
Basophils Relative: 0 %
Eosinophils Absolute: 0.8 10*3/uL — ABNORMAL HIGH (ref 0.0–0.5)
Eosinophils Relative: 5 %
HCT: 26.3 % — ABNORMAL LOW (ref 36.0–46.0)
Hemoglobin: 8.2 g/dL — ABNORMAL LOW (ref 12.0–15.0)
Lymphocytes Relative: 13 %
Lymphs Abs: 2 10*3/uL (ref 0.7–4.0)
MCH: 30.3 pg (ref 26.0–34.0)
MCHC: 31.2 g/dL (ref 30.0–36.0)
MCV: 97 fL (ref 80.0–100.0)
Metamyelocytes Relative: 2 %
Monocytes Absolute: 0.8 10*3/uL (ref 0.1–1.0)
Monocytes Relative: 5 %
Myelocytes: 3 %
Neutro Abs: 10.9 10*3/uL — ABNORMAL HIGH (ref 1.7–7.7)
Neutrophils Relative %: 66 %
Other: 1 %
Platelets: 299 10*3/uL (ref 150–400)
Promyelocytes Relative: 1 %
RBC: 2.71 MIL/uL — ABNORMAL LOW (ref 3.87–5.11)
RDW: 17.5 % — ABNORMAL HIGH (ref 11.5–15.5)
WBC: 15.5 10*3/uL — ABNORMAL HIGH (ref 4.0–10.5)
nRBC: 0.3 % — ABNORMAL HIGH (ref 0.0–0.2)

## 2020-05-05 LAB — MAGNESIUM: Magnesium: 1.7 mg/dL (ref 1.7–2.4)

## 2020-05-05 MED ORDER — AMOXICILLIN-POT CLAVULANATE 875-125 MG PO TABS
1.0000 | ORAL_TABLET | Freq: Two times a day (BID) | ORAL | Status: DC
  Administered 2020-05-05 – 2020-05-06 (×3): 1 via ORAL
  Filled 2020-05-05 (×3): qty 1

## 2020-05-05 MED ORDER — METOPROLOL SUCCINATE ER 25 MG PO TB24
12.5000 mg | ORAL_TABLET | Freq: Every day | ORAL | Status: DC
  Administered 2020-05-05 – 2020-05-06 (×2): 12.5 mg via ORAL
  Filled 2020-05-05 (×2): qty 1

## 2020-05-05 MED ORDER — METOPROLOL TARTRATE 5 MG/5ML IV SOLN
5.0000 mg | Freq: Four times a day (QID) | INTRAVENOUS | Status: DC
  Administered 2020-05-05 – 2020-05-06 (×5): 5 mg via INTRAVENOUS
  Filled 2020-05-05 (×4): qty 5

## 2020-05-05 MED ORDER — METOPROLOL TARTRATE 5 MG/5ML IV SOLN
INTRAVENOUS | Status: AC
  Filled 2020-05-05: qty 5

## 2020-05-05 NOTE — TOC Progression Note (Signed)
Transition of Care [facility]) - Progression Note   Patient Details  Name: [name] [name] MRN: [id number] Date of Birth: Sep 28, 1931  Transition of Care [facility]) CM/SW [place], LCSW Phone Number: 05/05/2020, 2:55 PM  Clinical Narrative: TOC consulted to speak with patient's daughter. Per daughter, the family does not want to pursue SNF at this time due to needing to be quarantined. CSW discussed DME needs with daughter and family would like her to discharge with a wheelchair and BSC. Wheelchair note completed. DME referral made to [facility] with [employer]. DME to be delivered closer to discharge. Orders are in. TOC to follow.  Expected Discharge Plan: [place] Barriers to Discharge: Continued Medical Work up  Expected Discharge Plan and Services Expected Discharge Plan: [place] In-house Referral: Clinical Social Work Discharge Planning Services: NA Post Acute Care Choice: [place] Living arrangements for the past 2 months: Single Family Home              DME Arranged: Walker rolling DME Agency: [place]: PT [place] Agency: [place] ([place]) Date [place]: 04/29/20 Time [place]: 40 Representative spoke with at [place]: Corene Cornea  Readmission Risk Interventions No flowsheet data found.

## 2020-05-05 NOTE — Progress Notes (Addendum)
Physical Therapy Treatment Patient Details Name: [name] [name] MRN: [id number] DOB: [birth year] Today's Date: 05/05/2020    History of Present Illness [name] [name]  is a 86 y.o. female, of hypertension, carotid artery occlusion, hypertension, venous stasis changes of lower extremities, presents to the ED with a chief complaint of syncope. And a lot of pain and only intermittently answers questions secondary to the pain asking "please help me God," so husband at bedside provides most of the history. He reports that patient had got up from the recliner chair walk to the kitchen to make her self a cup of coffee, before she was able to get a coffee cup she felt herself falling backwards and could not stop it. This is what she told him about the event. It was unwitnessed. Patient reports that she did not have chest pain, palpitations, shortness of breath before the event. She is not sure if she hit her head. Neither of them are sure if she lost consciousness as he was in the back bedroom and did not hear her fall, only heard her calling for him after she was alert. He reports that she was immediately alert and oriented. She was not able to get up on her own, so he got her up and took her to the recliner chair. After some rest, patient was able to get up and walk around. She even did some household chores and went up and down stairs. It was time for them to go to their appointment at the independent wound care when he noted that she was not talking right, and not looking at him right. He suspected that she had had a stroke. He reports that he does not hear very well, so he cannot describe how she was not talking right. He said it was not confusion, and he does not think she was slurring her speech, but that something was wrong with her voice. He also cannot further explain how she was not looking at him right, he just thought it was not normal. So they called Dr. Ria Comment office who recommended he come into the ER. The  time of my exam he reports that patient is talking correctly, and looking at him normally.    PT Comments    Patient presents up in chair and agreeable for therapy - her daughter present in room.  Patient demonstrates fair/good return for completing BLE ROM/strengthening exercises while seated in chair, slow labored cadence with frequent leaning backwards during gait training requiring verbal/tactile cueing to put body weight through arms, limited mostly due to fatigue and put back to bed after therapy.  Patient will benefit from continued physical therapy in hospital and recommended venue below to increase strength, balance, endurance for safe ADLs and gait.    Follow Up Recommendations  SNF;Supervision for mobility/OOB;Supervision - Intermittent     Equipment Recommendations  Rolling walker with 5" wheels    Recommendations for Other Services       Precautions / Restrictions Precautions Precautions: Fall Restrictions Weight Bearing Restrictions: No    Mobility  Bed Mobility Overal bed mobility: Needs Assistance Bed Mobility: Sit to Supine     Supine to sit: Min assist     General bed mobility comments: increased time, required Min assist to move BLE back onto bed  Transfers Overall transfer level: Needs assistance Equipment used: Rolling walker (2 wheeled) Transfers: Sit to/from [facility] Sit to Stand: Mod assist Stand pivot transfers: Mod assist       General transfer  comment: slow labored movement  Ambulation/Gait Ambulation/Gait assistance: Mod assist Gait Distance (Feet): 18 Feet Assistive device: Rolling walker (2 wheeled) Gait Pattern/deviations: Decreased step length - right;Decreased step length - left;Decreased stride length Gait velocity: decreased   General Gait Details: tends to lean backwards during initial gait training, requiring verbal cues to transmit body weight through BUE with fair carryover, slow labored cadence limited  mostly due to fatigue with HR increasing from 100 to 120 BPM   Stairs             Wheelchair Mobility    Modified Rankin (Stroke Patients Only)       Balance Overall balance assessment: Needs assistance Sitting-balance support: Feet supported;No upper extremity supported Sitting balance-Leahy Scale: Fair Sitting balance - Comments: fair/good seated at EOB   Standing balance support: During functional activity;Bilateral upper extremity supported Standing balance-Leahy Scale: Poor Standing balance comment: fair/poor using RW                            Cognition Arousal/Alertness: Awake/alert Behavior During Therapy: WFL for tasks assessed/performed Overall Cognitive Status: Within Functional Limits for tasks assessed                                        Exercises General Exercises - Lower Extremity Long Arc Quad: Seated;AROM;Strengthening;Both;10 reps Hip Flexion/Marching: Seated;AROM;Strengthening;Both;10 reps Toe Raises: Seated;AROM;Strengthening;Both;15 reps Heel Raises: Seated;AROM;Strengthening;Both;15 reps    General Comments        Pertinent Vitals/Pain Pain Assessment: Faces Faces Pain Scale: Hurts a little bit Pain Location: BLE at wound sites Pain Descriptors / Indicators: Sore Pain Intervention(s): Limited activity within patient's tolerance;Monitored during session;Repositioned    Home Living                      Prior Function            PT Goals (current goals can now be found in the care plan section) Acute Rehab PT Goals Patient Stated Goal: return home with family to assist PT Goal Formulation: With patient/family Time For Goal Achievement: 05/16/20 Potential to Achieve Goals: Good Progress towards PT goals: Progressing toward goals    Frequency    Min 3X/week      PT Plan Current plan remains appropriate    Co-evaluation              AM-PAC PT "6 Clicks" Mobility   Outcome  Measure  Help needed turning from your back to your side while in a flat bed without using bedrails?: A Little Help needed moving from lying on your back to sitting on the side of a flat bed without using bedrails?: A Lot Help needed moving to and from a bed to a chair (including a wheelchair)?: A Lot Help needed standing up from a chair using your arms (e.g., wheelchair or bedside chair)?: A Lot Help needed to walk in hospital room?: A Lot Help needed climbing 3-5 steps with a railing? : Total 6 Click Score: 12    End of Session Equipment Utilized During Treatment: Oxygen Activity Tolerance: Patient tolerated treatment well;Patient limited by fatigue Patient left: in bed;with call bell/phone within reach;with family/visitor present Nurse Communication: Mobility status PT Visit Diagnosis: Unsteadiness on feet (R26.81);Other abnormalities of gait and mobility (R26.89);Muscle weakness (generalized) (M62.81)     Time: 7782-4235 PT Time Calculation (min) (  ACUTE ONLY): 25 min  Charges:  $Gait Training: 8-22 mins $Therapeutic Exercise: 8-22 mins                     1:49 PM, 05/05/20 Lonell Grandchild, MPT Physical Therapist with [facility] [phone] office [phone] mobile phone

## 2020-05-05 NOTE — Progress Notes (Signed)
Patient with new onset afib this admission in setting of significant systemic illness. Soft bp's, was started on amio gtt initially and now transitioned to oral 200mg  bid. Coverted very shortly after starting amio, have not started anticoag due to transient episode and anemia Hgb down to 7.3.   Tele shows SR, no significant recurrent afib  Would continue oral amio 200mg  bid x 4 weeks , at outpatient follow up would anticipate discontinuing as this may have been a transient episode in setting of systemic illness. Also bp's may be better in the future so av nodal agents could be an option if recurrence.    We will sign off inpatient care, please call us back if needed   Carlyle Dolly MD

## 2020-05-05 NOTE — Progress Notes (Signed)
Pt converted back to NSR.

## 2020-05-05 NOTE — Care Management (Signed)
Patient suffers from cellulitis, arthritis, syncope, and collapse which impairs their ability to perform daily activities like bathing, dressing, and toileting in the home. A walker or cane will not resolve issue with performing activities of daily living. A wheelchair will allow patient to safely perform daily activities. Patient is not able to propel themselves in the home using a standard weight wheelchair due to arthritis and syncope, which affect endurance. Patient can self-propel in the lightweight wheelchair.

## 2020-05-05 NOTE — Progress Notes (Signed)
Pt assisted up to chair, back in Afib RVR, HR 150-160. Dr. Verlon Au notified. Orders received.

## 2020-05-05 NOTE — Progress Notes (Signed)
**Note De-identified  Obfuscation** EKG complete and placed in patient chart 

## 2020-05-05 NOTE — Progress Notes (Signed)
PROGRESS NOTE   [name] [name]  BLT:[id number] DOB: [birth year] DOA: 04/27/2020 PCP: Asencion Noble, MD  Brief Narrative:  86 year old white female Prior right hemicolectomy for 1015 for colon cancer ascending colon, carotid stenosis venous stasis lower extremities Recent leg ulcers treated at outpatient wound center  Brought to the emergency room 12/9 with syncope and collapse likely secondary to dehydration, significant lower extremity pain and inability to stand-noncompliant with lower extremity wraps-has seen vascular surgery showing normal ABIs  Developed paroxysmal A. fib morning of 12/15 transferred to stepdown started on amiodarone cardiology consulted  Assessment & Plan:   Principal Problem:   AKI (acute kidney injury) ([place]) Active Problems:   Syncope and collapse   Dehydration   Moderate protein-calorie malnutrition (HCC)   Cellulitis   HTN (hypertension)   1. New onset SVT A. fib RVR CHADS2 score >5 a. Developed SVT responsive to adenosine and carotid sinus massage b. Started on amiodarone after discussion with Dr. Harl Bowie cardiology converted to PO amio 200 bid and would transition into 4 weeks to 200 daily? c. No role for anticoagulation at this time d. Adding Toprol-XL 12.5 as still slight elevation heart rate e. Likely transfer out of stepdown once heart rate better controlled 2. Syncope and collapse 3. AKI secondary to hydronephrosis and possibly diuretics pressure meds 4. hypokalemia a. Probably secondary to volume depletion b. Much improved, and SL 12/16 c. Voiding trial as an outpatient in urology office potassium okay today 5. Lower extremity swelling/cellulitis a. Weeping wounds on left lower but better extremity-changing IV to PO Augmentin 12/18 b. Slightly elevated  T-max so monitor trends c. Wound care has recommended clean and apply Santyl cover with normal saline gauze and ABD with Kerlix and daily changes d. Watch for elevated T-max 6. Likely dilutional  anemia a. No evidence of acute blood loss repeat labs in the morning 7. Might need trasnfusion if lower than 7.0 hemoglobin appropriate 8.3 N a. Hypotensive therefore blood pressure meds (HCTZ 25, losartan 100 discontinued on admission 8. Carotid stenosis 9. BMI 24  DVT prophylaxis: S subcu heparin Code Status: Full Family Communication: Long discussion at bedside with her daughter who is a nurse Disposition:  Status is: Inpatient  Remains inpatient appropriate because:Hemodynamically unstable, Persistent severe electrolyte disturbances, Ongoing diagnostic testing needed not appropriate for outpatient work up and Unsafe d/c plan   Dispo: The patient is from: Home              Anticipated d/c is to: Home with hh              Anticipated d/c date is: 1 day              Patient currently is not medically stable to d/c.       Consultants:   Cardiology  Procedures: None  Antimicrobials: Vancomycin Zosyn-->Augmentin 12/18   Subjective: Doing well looks more alert awake pleasant no distress no pain no lower extremity edema   Objective: Vitals:   05/05/20 0600 05/05/20 0700 05/05/20 0800 05/05/20 0900  BP: (!) 147/44 (!) 156/55 (!) 158/49 118/72  Pulse: 95 95 96 (!) 107  Resp: 19 (!) 21 19 17   Temp:   98.1 F (36.7 C)   TempSrc:   Oral   SpO2: 100% 100% 100% 100%  Weight:      Height:        Intake/Output Summary (Last 24 hours) at 05/05/2020 0923 Last data filed at 05/05/2020 0721 Gross per 24 hour  Intake 1143.11 ml  Output 1850 ml  Net -706.89 ml   Filed Weights   05/03/20 0440 05/04/20 0524 05/05/20 0435  Weight: 62.2 kg 70.2 kg 70.5 kg    Examination: EOMI NCAT no focal deficit Chest clear  s1 s2 sinus tach up to 120 Abdomen soft Wounds to left lower extremity not reviewed today Neurologically intact but somewhat withdrawn   Data Reviewed: I have personally reviewed following labs and imaging studies WBC 25-->15-->14.5-->15.5 Hemoglobin  9.1-->7.6-->7.3-->8.2  K 3.2-->3.6 BUNs/creatinine 90/3.1-->73/1.6-->58/1.23-->37/0.8  Radiology Studies: No results found.   Scheduled Meds: . amiodarone  200 mg Oral BID  . amoxicillin-clavulanate  1 tablet Oral Q12H  . aspirin EC  81 mg Oral Daily  . atorvastatin  20 mg Oral Daily  . Chlorhexidine Gluconate Cloth  6 each Topical Daily  . collagenase   Topical Daily  . feeding supplement  237 mL Oral BID BM  . gabapentin  300 mg Oral BID  . heparin  5,000 Units Subcutaneous Q8H  . multivitamin with minerals  1 tablet Oral Daily  . nutrition supplement (JUVEN)  1 packet Oral BID BM  . silver sulfADIAZINE  1 application Topical Daily  . tamsulosin  0.4 mg Oral Q supper   Continuous Infusions:    LOS: 8 days    Time spent:25  Nita Sells, MD [facility] To contact the attending provider between 7A-7P or the covering provider during after hours 7P-7A, please log into the web site [email or web address] and access using universal [place] password for that web site. If you do not have the password, please call the hospital operator.  05/05/2020, 9:23 AM

## 2020-05-06 DIAGNOSIS — I480 Paroxysmal atrial fibrillation: Secondary | ICD-10-CM | POA: Diagnosis present

## 2020-05-06 LAB — COMPREHENSIVE METABOLIC PANEL
ALT: 30 U/L (ref 0–44)
AST: 30 U/L (ref 15–41)
Albumin: 1.3 g/dL — ABNORMAL LOW (ref 3.5–5.0)
Alkaline Phosphatase: 97 U/L (ref 38–126)
Anion gap: 7 (ref 5–15)
BUN: 24 mg/dL — ABNORMAL HIGH (ref 8–23)
CO2: 19 mmol/L — ABNORMAL LOW (ref 22–32)
Calcium: 7.5 mg/dL — ABNORMAL LOW (ref 8.9–10.3)
Chloride: 112 mmol/L — ABNORMAL HIGH (ref 98–111)
Creatinine, Ser: 0.74 mg/dL (ref 0.44–1.00)
GFR, Estimated: >60 mL/min (ref >60)
Glucose, Bld: 96 mg/dL (ref 70–99)
Potassium: 4.1 mmol/L (ref 3.5–5.1)
Sodium: 138 mmol/L (ref 135–145)
Total Bilirubin: 0.2 mg/dL — ABNORMAL LOW (ref 0.3–1.2)
Total Protein: 4.9 g/dL — ABNORMAL LOW (ref 6.5–8.1)

## 2020-05-06 LAB — CBC WITH DIFFERENTIAL/PLATELET
Abs Immature Granulocytes: 2 10*3/uL — ABNORMAL HIGH (ref 0.00–0.07)
Basophils Absolute: 0.1 10*3/uL (ref 0.0–0.1)
Basophils Relative: 1 %
Eosinophils Absolute: 0.6 10*3/uL — ABNORMAL HIGH (ref 0.0–0.5)
Eosinophils Relative: 4 %
HCT: 26.7 % — ABNORMAL LOW (ref 36.0–46.0)
Hemoglobin: 7.9 g/dL — ABNORMAL LOW (ref 12.0–15.0)
Immature Granulocytes: 12 %
Lymphocytes Relative: 13 %
Lymphs Abs: 2.2 10*3/uL (ref 0.7–4.0)
MCH: 30.5 pg (ref 26.0–34.0)
MCHC: 29.6 g/dL — ABNORMAL LOW (ref 30.0–36.0)
MCV: 103.1 fL — ABNORMAL HIGH (ref 80.0–100.0)
Monocytes Absolute: 1 10*3/uL (ref 0.1–1.0)
Monocytes Relative: 6 %
Neutro Abs: 10.5 10*3/uL — ABNORMAL HIGH (ref 1.7–7.7)
Neutrophils Relative %: 64 %
Platelets: 309 10*3/uL (ref 150–400)
RBC: 2.59 MIL/uL — ABNORMAL LOW (ref 3.87–5.11)
RDW: 17.8 % — ABNORMAL HIGH (ref 11.5–15.5)
WBC: 16.4 10*3/uL — ABNORMAL HIGH (ref 4.0–10.5)
nRBC: 0.3 % — ABNORMAL HIGH (ref 0.0–0.2)

## 2020-05-06 MED ORDER — SILVER SULFADIAZINE 1 % EX CREA: 1.0000 "application " | TOPICAL_CREAM | Freq: Every day | CUTANEOUS | 2 refills | Status: AC

## 2020-05-06 MED ORDER — AMIODARONE HCL 200 MG PO TABS: 200.0000 mg | ORAL_TABLET | Freq: Two times a day (BID) | ORAL | 0 refills | Status: AC

## 2020-05-06 MED ORDER — ASPIRIN EC 81 MG PO TBEC
81.0000 mg | DELAYED_RELEASE_TABLET | Freq: Every day | ORAL | 11 refills | Status: AC
Start: 2020-05-06 — End: ?

## 2020-05-06 MED ORDER — DILTIAZEM HCL 30 MG PO TABS: 30.0000 mg | ORAL_TABLET | Freq: Two times a day (BID) | ORAL | 0 refills | Status: AC | PRN

## 2020-05-06 MED ORDER — AMOXICILLIN-POT CLAVULANATE 875-125 MG PO TABS: 1.0000 | ORAL_TABLET | Freq: Two times a day (BID) | ORAL | 0 refills | Status: AC

## 2020-05-06 MED ORDER — COLLAGENASE 250 UNIT/GM EX OINT: TOPICAL_OINTMENT | Freq: Every day | CUTANEOUS | 1 refills | Status: AC

## 2020-05-06 MED ORDER — MIRTAZAPINE 15 MG PO TABS: 7.5000 mg | ORAL_TABLET | Freq: Every day | ORAL | 2 refills | Status: AC

## 2020-05-06 MED ORDER — TAMSULOSIN HCL 0.4 MG PO CAPS: 0.4000 mg | ORAL_CAPSULE | Freq: Every day | ORAL | 1 refills | Status: AC

## 2020-05-06 MED ORDER — DILTIAZEM HCL ER COATED BEADS 120 MG PO CP24: 120.0000 mg | ORAL_CAPSULE | Freq: Every day | ORAL | 1 refills | Status: AC

## 2020-05-06 NOTE — TOC Progression Note (Signed)
Transition of Care [facility]) - Progression Note    Patient Details  Name: [name] [name] MRN: [id number] Date of Birth: Jan 23, 1932  Transition of Care [facility]) CM/SW Contact  Natasha Bence, LCSW Phone Number: 05/06/2020, 5:04 PM  Clinical Narrative:    Patient suffers from acute Kidney injury, AF (paroxysmal atrial fibrillation), Syncope and collapse and  which impairs their ability to perform daily activities like (toileting, feeding, dressing, grooming, bathing) in the home. A (cane, walker, crutch) will not resolve issue with performing activities of daily living. A wheelchair will allow patient to safely perform daily activities. Patient is not able to propel themselves in the home using a standard weight wheelchair due to  (arm weakness, general weakness, endurance). Patient can self propel in the lightweight wheelchair.   Expected Discharge Plan: [place] Barriers to Discharge: Barriers Resolved  Expected Discharge Plan and Services Expected Discharge Plan: [place] In-house Referral: Clinical Social Work Discharge Planning Services: NA Post Acute Care Choice: [place] Living arrangements for the past 2 months: [place] Expected Discharge Date: 05/06/20               DME Arranged: Bedside commode,Wheelchair manual DME Agency: [employer] Date DME Agency Contacted: 05/06/20 Time DME Agency Contacted: [phone] Representative spoke with at DME Agency: [place]: RN,PT [place] Agency: [place] ([place]) Date [place]: 05/06/20 Time [place]: 1245 Representative spoke with at [place]: [place] ([place]) Interventions    Readmission Risk Interventions No flowsheet data found.

## 2020-05-06 NOTE — Discharge Summary (Signed)
[name] [name], is a 86 y.o. female  DOB 10/12/1931  MRN [id number].  Admission date:  04/27/2020  Admitting Physician  Rolla Plate, DO  Discharge Date:  05/06/2020   Primary MD  Asencion Noble, MD  Recommendations for primary care physician for things to follow:   1)Avoid ibuprofen/Advil/Aleve/Motrin/Goody Powders/Naproxen/BC powders/Meloxicam/Diclofenac/Indomethacin and other Nonsteroidal anti-inflammatory medications as these will make you more likely to bleed and can cause stomach ulcers, can also cause Kidney problems.   2) hydrochlorothiazide/HCTZ and losartan has been discontinued due to soft blood pressures, and to allow room to titrate up Cardizem  3) okay to take Remeron/mirtazapine 7.5 mg at bedtime for sleep, anxiety and appetite stimulation--- please avoid using more than 15 mg of Remeron due to concerns for risk of excessive sleepiness  4)Please follow-up with Urologist Dr. Alyson Ingles in about 1 week for recheck and follow-up evaluation  in his office----[facility], [address], [place] [address] Phone Number----[phone] -- 5) keep your Foley catheter in and take your Flomax as prescribed until you are seen by urologist  6) please take Cardizem CD 120 mg daily along with amiodarone 200 mg twice a day to keep your heart rate from going too fast due to atrial fibrillation -Okay to take additional Cardizem 30 mg every 12 hours as needed if your heart rate is persistently above 120 bpm  7) wound care for both legs :- dressing Procedure/Placement/Frequency: Cleanse wounds to bilateral anterior lower legs.  Apply santyl to open wounds, apply silvadene cream to non-open areas daily and Cover with Normal saline moist gauze.  Secure with ABD pads and kerlix/tape.  Change daily.   8) outpatient follow-up with a cardiologist advised in about a week or so to determine how  long you should be on amiodarone   Admission Diagnosis  AKI (acute kidney injury) ([place]) [N17.9] Cellulitis of lower extremity, unspecified laterality [L03.119]   Discharge Diagnosis  AKI (acute kidney injury) ([place]) [N17.9] Cellulitis of lower extremity, unspecified laterality [L03.119]    Principal Problem:   AKI (acute kidney injury) ([place]) Active Problems:   AF (paroxysmal atrial fibrillation) ([facility])   Syncope and collapse   Cellulitis   Dehydration   Moderate protein-calorie malnutrition ([facility])   HTN (hypertension)      Past Medical History:  Diagnosis Date  . Arthritis    hands  . Carotid artery occlusion   . Colon cancer ([place])   . Headache(784.0)    occassional headaches  . Hypertension   . Malignant neoplasm of colon ([place])   . Medical history non-contributory     Past Surgical History:  Procedure Laterality Date  . ABDOMINAL HYSTERECTOMY    . APPENDECTOMY    . COLONOSCOPY N/A 08/26/2013   Procedure: COLONOSCOPY;  Surgeon: Rogene Houston, MD;  [facility];  Service: Endoscopy;  Laterality: N/A;  . COLONOSCOPY N/A 11/24/2014   Procedure: COLONOSCOPY;  Surgeon: Rogene Houston, MD;  [facility];  Service: Endoscopy;  Laterality: N/A;  1200  .  COLONOSCOPY N/A 03/19/2018   Procedure: COLONOSCOPY;  Surgeon: Rogene Houston, MD;  [facility];  Service: Endoscopy;  Laterality: N/A;  830  . FRACTURE SURGERY     wrist  . INTRAOCULAR LENS IMPLANT, SECONDARY Bilateral 2007   bilateral  . PARTIAL COLECTOMY N/A 08/27/2013   Procedure: RIGHT HEMICOLECTOMY;  Surgeon: Jamesetta So, MD;  Location: [facility];  Service: General;  Laterality: N/A;  . POLYPECTOMY  03/19/2018   Procedure: POLYPECTOMY;  Surgeon: Rogene Houston, MD;  [facility];  Service: Endoscopy;;  colon  . TONSILLECTOMY       HPI  from the history and physical done on the day of admission:    [name] [name]  is a 86 y.o. female, of hypertension, carotid artery  occlusion, hypertension, venous stasis changes of lower extremities, presents to the ED with a chief complaint of syncope. And a lot of pain and only intermittently answers questions secondary to the pain asking "please help me God," so husband at bedside provides most of the history. He reports that patient had got up from the recliner chair walk to the kitchen to make her self a cup of coffee, before she was able to get a coffee cup she felt herself falling backwards and could not stop it. This is what she told him about the event. It was unwitnessed. Patient reports that she did not have chest pain, palpitations, shortness of breath before the event. She is not sure if she hit her head. Neither of them are sure if she lost consciousness as he was in the back bedroom and did not hear her fall, only heard her calling for him after she was alert. He reports that she was immediately alert and oriented. She was not able to get up on her own, so he got her up and took her to the recliner chair. After some rest, patient was able to get up and walk around. She even did some household chores and went up and down stairs. It was time for them to go to their appointment at the independent wound care when he noted that she was not talking right, and not looking at him right. He suspected that she had had a stroke. He reports that he does not hear very well, so he cannot describe how she was not talking right. He said it was not confusion, and he does not think she was slurring her speech, but that something was wrong with her voice. He also cannot further explain how she was not looking at him right, he just thought it was not normal. So they called Dr. Ria Comment office who recommended he come into the ER. The time of my exam he reports that patient is talking correctly, and looking at him normally.  Patient has been battling lower extremity venous stasis ulcers for 2 months. They report that she has to sleep in a recliner  chair because elevating her legs causes pain that she cannot stand. Both wound care and vascular surgery outpatient have advised them to elevate her legs and wrap them. She did have her legs wrapped at wound care 1 time, and his refused wraps since because it was too painful. They are using Medihoney at wound care, but this caused burning with every application. When she saw vascular surgery outpatient they recommended changing to Silvadene this information on to the wound care center ABIs were done in the outpatient setting that showed normal ankle/arm index bilateral with biphasic  flow at dorsalis pedis and posterior tibial bilaterally. Vascular surgery had recommended patient be diuresed, as peripheral edema was slowing her healing of her ulcers. Patient was started on hydrochlorothiazide in November. She reports over the last 2 weeks she has had no appetite. She drinks less than a glass of water, and less than a glass of tea per day. She has not been eating because she does not feel hungry. Patient denies any nausea vomiting abdominal pain diarrhea. She denies any fevers, any aches and pains other than her lower extremity wounds. Patient denies any pain or burning with urination, cough or shortness of breath.  Patient is on vaccinated for Covid and reports that she will not get vaccinated. She does not smoke, does not drink alcohol, does not use illicit drugs. Patient is DNR, and husband is distressed by this CODE STATUS. Patient confirms DNR status even after her husband expressed his concerns.  In the ED Temperature 97.5, heart rate 81-110, respiratory rate 14-17, blood pressure 82/61 -136/55 White blood cell count 17.6, hemoglobin 10.5, platelet count 364 Chemistry panel reveals a low bicarb at 17, elevated creatinine at 3.12 Chest x-ray shows no focal airspace disease Patient was given 500 LR bolus and Dilaudid Admission was requested for work-up of syncope, and cellulitis     Hospital  Course:     Brief Narrative:  86 year old white female Prior right hemicolectomy for 1015 for colon cancer ascending colon, carotid stenosis venous stasis lower extremities Recent leg ulcers treated at outpatient wound center  Brought to the emergency room 12/9 with syncope and collapse likely secondary to dehydration, significant lower extremity pain and inability to stand-noncompliant with lower extremity wraps-has seen vascular surgery showing normal ABIs  Developed paroxysmal A. fib morning of 12/15 transferred to stepdown started on amiodarone cardiology consulted -Back in sinus rhythm since 05/05/2020 on oral amiodarone    A/p  1)New onset SVT A. fib RVR CHADS2 score >5 a. Developed SVT responsive to adenosine and carotid sinus massage b. Started on amiodarone after discussion with Dr. Harl Bowie cardiology converted to PO amio 200 bid and would transition into 4 weeks --outpatient follow-up with cardiology within the next 1 to 2 weeks advised to determine if dose of amiodarone needs to be reduced or if amiodarone can be discontinued   c. No role for anticoagulation at this time--- especially given bleeding risk, patient daughter Rojelio Brenner concurs EF is preserved at over 75%, will attempt rate control with Cardizem CD 120 mg daily and may take additional 30 mg of the short acting Cardizem as needed for heart rate over 120 bpm  2)AKI secondary to hydronephrosis in the setting of urinary retention compounded by diuretics--- renal function is normalized with discontinuation of diuretics and placement of Foley catheter --- Creatinine is down to 0.74 from 2.2 on admission -Outpatient follow-up with urologist for urinary retention advised -Patient is to keep Foley catheter in place and take Flomax until seen by urologist as outpatient  3)Lower extremity swelling/cellulitis superimposed on chronic venous stasis changes and ulcers--- a. Weeping wounds on left lower but better extremity-patient  was treated with IV vancomycin and Zosyn, switched over to p.o. Augmentin on 05/06/2020  --- Wound care consult appreciated noted b. Wound care has recommended clean and apply Santyl to open areas cover with normal saline gauze and ABD with Kerlix and daily changes -We will apply Silvadene to nonopen areas  3)Acute acute on chronic anemia--- suspect H&H drop partly related to hemodilution -No evidence of ongoing bleeding, hemoglobin  currently stable around 8  4)Anorexia/Anxiety ---  poor appetite noted, okay to try low-dose Remeron for appetite stimulation poor appetite noted,   5)Generalized weakness and ambulatory dysfunction--- concerns about fall and possible near syncope prior to admission --- Physical therapist recommends SNF rehab patient and family declined SNF and wants to go home with home health services at this time --Social worker has provided equipment for patient and family   Code Status: Full Family Communication:  Discussed with her daughter Misti who is a Marine scientist Disposition:     Dispo: The patient is from: Home  Anticipated d/c is to: Home with hh     Consultants:   Cardiology  Procedures: None  Antimicrobials: Vancomycin Zosyn-->Augmentin 12/18  Discharge Condition: stable  Follow UP   Follow-up Information    [facility].   Specialty: Home Health Services Why: HHPT               Diet and Activity recommendation:  As advised  Discharge Instructions    Discharge Instructions    Call MD for:  difficulty breathing, headache or visual disturbances   Complete by: As directed    Call MD for:  persistant dizziness or light-headedness   Complete by: As directed    Call MD for:  persistant nausea and vomiting   Complete by: As directed    Call MD for:  severe uncontrolled pain   Complete by: As directed    Call MD for:  temperature >100.4   Complete by: As directed    Diet - low sodium  heart healthy   Complete by: As directed    Discharge instructions   Complete by: As directed    1)Avoid ibuprofen/Advil/Aleve/Motrin/Goody Powders/Naproxen/BC powders/Meloxicam/Diclofenac/Indomethacin and other Nonsteroidal anti-inflammatory medications as these will make you more likely to bleed and can cause stomach ulcers, can also cause Kidney problems.   2) hydrochlorothiazide/HCTZ and losartan has been discontinued due to soft blood pressures, and to allow room to titrate up Cardizem  3) okay to take Remeron/mirtazapine 7.5 mg at bedtime for sleep, anxiety and appetite stimulation--- please avoid using more than 15 mg of Remeron due to concerns for risk of excessive sleepiness  4)Please follow-up with Urologist Dr. Alyson Ingles in about 1 week for recheck and follow-up evaluation  in his office----[facility], [address], [address], [place] [address] Phone Number----[phone] -- 5) keep your Foley catheter in and take your Flomax as prescribed until you are seen by urologist  6) please take Cardizem CD 120 mg daily along with amiodarone 200 mg twice a day to keep your heart rate from going too fast due to atrial fibrillation -Okay to take additional Cardizem 30 mg every 12 hours as needed if your heart rate is persistently above 120 bpm  7) wound care for both legs :- dressing Procedure/Placement/Frequency: Cleanse wounds to bilateral anterior lower legs.  Apply santyl to open wounds, apply silvadene cream to non-open areas daily and Cover with Normal saline moist gauze.  Secure with ABD pads and kerlix/tape.  Change daily.   8) outpatient follow-up with a cardiologist advised in about a week or so to determine how long you should be on amiodarone   Discharge wound care:   Complete by: As directed    1)---Dressing Procedure/Placement/Frequency: Cleanse wounds to bilateral anterior lower legs.  Apply santyl to open wounds, apply silvadene cream to non-open areas daily  and Cover with Normal saline moist gauze.  Secure with ABD pads  and kerlix/tape.  Change daily.   Increase activity slowly   Complete by: As directed        Discharge Medications     Allergies as of 05/06/2020   No Known Allergies     Medication List    STOP taking these medications   hydrochlorothiazide 25 MG tablet Commonly known as: HYDRODIURIL   losartan 100 MG tablet Commonly known as: COZAAR     TAKE these medications   acetaminophen 500 MG tablet Commonly known as: TYLENOL Take 1,000 mg by mouth daily as needed for moderate pain or headache.   amiodarone 200 MG tablet Commonly known as: PACERONE Take 1 tablet (200 mg total) by mouth 2 (two) times daily. For Heart rate control   amoxicillin-clavulanate 875-125 MG tablet Commonly known as: AUGMENTIN Take 1 tablet by mouth 2 (two) times daily for 4 days.   aspirin EC 81 MG tablet Take 1 tablet (81 mg total) by mouth daily with breakfast. What changed: when to take this   atorvastatin 20 MG tablet Commonly known as: LIPITOR Take 20 mg by mouth daily.   Benefiber Drink Mix Pack Take 4 g by mouth at bedtime.   collagenase ointment Commonly known as: SANTYL Apply topically daily. Apply to only Open areas of Leg wounds Start taking on: May 07, 2020   diltiazem 120 MG 24 hr capsule Commonly known as: Cardizem CD Take 1 capsule (120 mg total) by mouth daily. For Heart Rate control   diltiazem 30 MG tablet Commonly known as: Cardizem Take 1 tablet (30 mg total) by mouth every 12 (twelve) hours as needed. For HR >>120BPM   docusate sodium 100 MG capsule Commonly known as: COLACE Take 200 mg by mouth as needed for mild constipation.   gabapentin 100 MG capsule Commonly known as: NEURONTIN Take 100 mg by mouth 2 (two) times daily.   mirtazapine 15 MG tablet Commonly known as: Remeron Take 0.5-1 tablets (7.5-15 mg total) by mouth at bedtime. For Sleep and Appetite Stimulation-   REFRESH OP Place 1  drop into both eyes daily as needed (dry eyes).   silver sulfADIAZINE 1 % cream Commonly known as: Silvadene Apply 1 application topically daily. Apply to non-open areas of Leg rash Start taking on: May 07, 2020 What changed: additional instructions   tamsulosin 0.4 MG Caps capsule Commonly known as: FLOMAX Take 1 capsule (0.4 mg total) by mouth daily with supper.            Durable Medical Equipment  (From admission, onward)         Start     Ordered   05/05/20 1346  For home use only DME Bedside commode  Once       Question:  Patient needs a bedside commode to treat with the following condition  Answer:  Weakened patient   05/05/20 1345   05/05/20 1345  For home use only DME lightweight manual wheelchair with seat cushion  Once       Comments: Patient suffers from weak which impairs their ability to perform daily activities like bathing in the home.  A crutch or walker will not resolve  issue with performing activities of daily living. A wheelchair will allow patient to safely perform daily activities. Patient is not able to propel themselves in the home using a standard weight wheelchair due to general weakness. Patient can self propel in the lightweight wheelchair. Length of need Lifetime. Accessories: elevating leg rests (ELRs), wheel locks, extensions and anti-tippers.    "  16x16 lightweight hemi height"   05/05/20 1345   04/28/20 1440  For home use only DME Walker rolling  Once       Question Answer Comment  Walker: With [place] Wheels   Patient needs a walker to treat with the following condition Pneumonia      04/28/20 1439   04/28/20 1424  For home use only DME 4 wheeled rolling walker with seat  Once       Question:  Patient needs a walker to treat with the following condition  Answer:  Pneumonia   04/28/20 1423           Discharge Care Instructions  (From admission, onward)         Start     Ordered   05/06/20 0000  Discharge wound care:        Comments: 1)---Dressing Procedure/Placement/Frequency: Cleanse wounds to bilateral anterior lower legs.  Apply santyl to open wounds, apply silvadene cream to non-open areas daily and Cover with Normal saline moist gauze.  Secure with ABD pads and kerlix/tape.  Change daily.   05/06/20 1203         Major procedures and Radiology Reports - PLEASE review detailed and final reports for all details, in brief -   CT HEAD WO CONTRAST  Result Date: 04/28/2020 CLINICAL DATA:  Syncopal episode yesterday. Unwitnessed fall. Fell backwards. Patient is unsure if she hit her head. EXAM: CT HEAD WITHOUT CONTRAST TECHNIQUE: Contiguous axial images were obtained from the base of the skull through the vertex without intravenous contrast. COMPARISON:  None. FINDINGS: Brain: No acute infarct, hemorrhage, or mass lesion is present. No significant white matter lesions are present. The ventricles are of normal size. No significant extraaxial fluid collection is present. The brainstem and cerebellum are within normal limits. Vascular: Atherosclerotic calcifications are present within the cavernous internal carotid arteries bilaterally. No hyperdense vessel is present. Skull: Calvarium is intact. No focal lytic or blastic lesions are present. No significant soft tissue injury is present. Sinuses/Orbits: The paranasal sinuses and mastoid air cells are clear. Bilateral lens replacements are noted. Globes and orbits are otherwise unremarkable. IMPRESSION: Normal CT appearance of the head for age.  No acute trauma. Electronically Signed   By: San Morelle M.D.   On: 04/28/2020 11:18   US RENAL  Result Date: 05/02/2020 CLINICAL DATA:  Acute renal failure. EXAM: RENAL / URINARY TRACT ULTRASOUND COMPLETE COMPARISON:  CT 08/25/2013. FINDINGS: Right Kidney: Renal measurements: 11.3 x 5.0 x 6.2 cm = volume: 182.6 mL. Echogenicity within normal limits. No mass. Mild right hydronephrosis visualized. Left Kidney: Renal  measurements: 11.9 x 6.4 x 6.6 cm = volume: 263.0 mL. Echogenicity within normal limits. No mass. Moderate left hydronephrosis visualized. Bladder: Mild bladder distention cannot be excluded. Other: None. IMPRESSION: 1. Mild right and moderate left hydronephrosis. 2. Mild bladder distention cannot be excluded. Electronically Signed   By: Marcello Moores  Register   On: 05/02/2020 15:47   MR TIBIA FIBULA RIGHT WO CONTRAST  Result Date: 05/02/2020 CLINICAL DATA:  Bilateral lower extremity wounds. Evaluate for deep soft tissue infection or osteomyelitis. EXAM: MRI OF LOWER LEFT EXTREMITY WITHOUT CONTRAST; MRI OF LOWER RIGHT EXTREMITY WITHOUT CONTRAST TECHNIQUE: Multiplanar, multisequence MR imaging of the bilateral lower legs suspicious was performed. No intravenous contrast was administered. COMPARISON:  None. FINDINGS: Bones/Joint/Cartilage No suspicious marrow signal abnormality. No fracture or dislocation. Small right and tiny left Baker cysts. No joint effusion. Muscles and Tendons No muscle edema. Mild atrophy of the  right medial gastrocnemius muscle. Visualized bilateral flexor, extensor, peroneal, and Achilles tendons are intact. Soft tissue Skin irregularity along the anterior aspect of both distal lower legs, likely corresponding to the patient's wounds. Circumferential soft tissue swelling of both lower legs, worse on the left. No fluid collection or hematoma. No soft tissue mass. IMPRESSION: 1. Skin irregularity along the anterior aspect of both distal lower legs, likely corresponding to the patient's wounds. No evidence of osteomyelitis or abscess. 2. Circumferential soft tissue swelling of both lower legs, worse on the left. This can be seen with cellulitis, venous insufficiency, or third spacing. Electronically Signed   By: Titus Dubin M.D.   On: 05/02/2020 17:00   MR TIBIA FIBULA LEFT WO CONTRAST  Result Date: 05/02/2020 CLINICAL DATA:  Bilateral lower extremity wounds. Evaluate for deep soft  tissue infection or osteomyelitis. EXAM: MRI OF LOWER LEFT EXTREMITY WITHOUT CONTRAST; MRI OF LOWER RIGHT EXTREMITY WITHOUT CONTRAST TECHNIQUE: Multiplanar, multisequence MR imaging of the bilateral lower legs suspicious was performed. No intravenous contrast was administered. COMPARISON:  None. FINDINGS: Bones/Joint/Cartilage No suspicious marrow signal abnormality. No fracture or dislocation. Small right and tiny left Baker cysts. No joint effusion. Muscles and Tendons No muscle edema. Mild atrophy of the right medial gastrocnemius muscle. Visualized bilateral flexor, extensor, peroneal, and Achilles tendons are intact. Soft tissue Skin irregularity along the anterior aspect of both distal lower legs, likely corresponding to the patient's wounds. Circumferential soft tissue swelling of both lower legs, worse on the left. No fluid collection or hematoma. No soft tissue mass. IMPRESSION: 1. Skin irregularity along the anterior aspect of both distal lower legs, likely corresponding to the patient's wounds. No evidence of osteomyelitis or abscess. 2. Circumferential soft tissue swelling of both lower legs, worse on the left. This can be seen with cellulitis, venous insufficiency, or third spacing. Electronically Signed   By: Titus Dubin M.D.   On: 05/02/2020 17:00   US Carotid Bilateral  Result Date: 04/28/2020 CLINICAL DATA:  Syncope. EXAM: BILATERAL CAROTID DUPLEX ULTRASOUND TECHNIQUE: Pearline Cables scale imaging, color Doppler and duplex ultrasound were performed of bilateral carotid and vertebral arteries in the neck. COMPARISON:  01/23/2017 FINDINGS: Criteria: Quantification of carotid stenosis is based on velocity parameters that correlate the residual internal carotid diameter with NASCET-based stenosis levels, using the diameter of the distal internal carotid lumen as the denominator for stenosis measurement. The following velocity measurements were obtained: RIGHT ICA: 144/34 cm/sec CCA: 300/76 cm/sec  SYSTOLIC ICA/CCA RATIO:  1.3 ECA: 126 cm/sec LEFT ICA: 127/36 cm/sec CCA: 22/63 cm/sec SYSTOLIC ICA/CCA RATIO:  1.5 ECA: 89 cm/sec RIGHT CAROTID ARTERY: Heterogeneous plaque at the right carotid bulb. Peak systolic velocity at the right carotid bulb measures 136 cm/sec and previously measured 217 cm/sec. External carotid artery is patent with normal waveform. Peak systolic velocity in the right internal carotid artery measures 144 cm/sec and previously measured 233 cm/sec. RIGHT VERTEBRAL ARTERY: Antegrade flow and normal waveform in the right vertebral artery. LEFT CAROTID ARTERY: Shadowing plaque at the left carotid bulb. Heterogeneous plaque at the origin of the external carotid artery. External carotid artery is patent with normal waveform.Distal left internal carotid artery is very tortuous. Left internal carotid artery peak systolic velocity is minimally elevated measuring up to 127 cm/sec. Previously the left internal carotid artery peak systolic velocity measuring 239 cm/sec. LEFT VERTEBRAL ARTERY: Antegrade flow and normal waveform in the left vertebral artery. IMPRESSION: 1. Atherosclerotic disease involving the bilateral carotid arteries. Peak systolic velocities have significantly decreased since the  exam in 2018. 2. Estimated degree of stenosis in the right internal carotid artery is 50-69%. 3. Peak systolic velocity in the left internal carotid artery is minimally elevated. This elevation could be related to tortuosity. Cannot exclude stenosis measuring 50-69% in the left internal carotid artery. 4. Patent vertebral arteries with antegrade flow. Electronically Signed   By: Markus Daft M.D.   On: 04/28/2020 11:52   DG CHEST PORT 1 VIEW  Result Date: 04/29/2020 CLINICAL DATA:  Hypoxia. EXAM: PORTABLE CHEST 1 VIEW COMPARISON:  April 27, 2020 FINDINGS: The heart size is enlarged. Aortic calcifications are noted. There is blunting at the left costophrenic angle which may represent a combination of a  small left-sided pleural effusion and atelectasis. There is no pneumothorax. There is no acute osseous abnormality. IMPRESSION: 1. Cardiomegaly. 2. Blunting at the left costophrenic angle which may represent a combination of a small left-sided pleural effusion and atelectasis. Electronically Signed   By: Constance Holster M.D.   On: 04/29/2020 01:40   DG Chest Portable 1 View  Result Date: 04/27/2020 CLINICAL DATA:  weakness EXAM: PORTABLE CHEST 1 VIEW COMPARISON:  08/26/2013. FINDINGS: No focal consolidation. No pneumothorax or pleural effusion. Stable cardiomediastinal silhouette. Multilevel spondylosis. IMPRESSION: No focal airspace disease. Electronically Signed   By: Primitivo Gauze M.D.   On: 04/27/2020 17:33   ECHOCARDIOGRAM COMPLETE  Result Date: 04/28/2020    ECHOCARDIOGRAM REPORT   Patient Name:   AL Loma Date of Exam: 04/28/2020 Medical Rec #:  [id number]  Height:       63.0 in Accession #:    [id number] Weight:       135.0 lb Date of Birth:  April 15, 1932 BSA:          1.636 m Patient Age:    62 years   BP:           130/54 mmHg Patient Gender: F          HR:           118 bpm. Exam Location:  [facility] Procedure: 2D Echo Indications:    Syncope 780.2 / R55  History:        Patient has no prior history of Echocardiogram examinations.                 Signs/Symptoms:Syncope; Risk Factors:Former Smoker and                 Hypertension. Colon Cancer, Anemia.  Sonographer:    Leavy Cella RDCS (AE) Referring Phys: [id number] ASIA B [place]  1. Left ventricular ejection fraction, by estimation, is >75%. The left ventricle has hyperdynamic function. The left ventricle has no regional wall motion abnormalities. There is moderate left ventricular hypertrophy. Left ventricular diastolic parameters are consistent with Grade I diastolic dysfunction (impaired relaxation). Elevated left atrial pressure.  2. Right ventricular systolic function is normal. The right ventricular size is  normal.  3. The mitral valve is normal in structure. No evidence of mitral valve regurgitation. No evidence of mitral stenosis. Moderate mitral annular calcification.  4. The aortic valve has an indeterminant number of cusps. There is moderate calcification of the aortic valve. There is moderate thickening of the aortic valve. Aortic valve regurgitation is not visualized. No aortic stenosis is present.  5. The inferior vena cava is normal in size with greater than 50% respiratory variability, suggesting right atrial pressure of 3 mmHg. FINDINGS  Left Ventricle: Left ventricular ejection fraction, by estimation, is >75%. The left ventricle  has hyperdynamic function. The left ventricle has no regional wall motion abnormalities. The left ventricular internal cavity size was normal in size. There is moderate left ventricular hypertrophy. Left ventricular diastolic parameters are consistent with Grade I diastolic dysfunction (impaired relaxation). Elevated left atrial pressure. Right Ventricle: The right ventricular size is normal. No increase in right ventricular wall thickness. Right ventricular systolic function is normal. Left Atrium: Left atrial size was normal in size. Right Atrium: Right atrial size was normal in size. Pericardium: There is no evidence of pericardial effusion. Mitral Valve: The mitral valve is normal in structure. There is moderate thickening of the mitral valve leaflet(s). There is moderate calcification of the mitral valve leaflet(s). Moderate mitral annular calcification. No evidence of mitral valve regurgitation. No evidence of mitral valve stenosis. Tricuspid Valve: The tricuspid valve is normal in structure. Tricuspid valve regurgitation is mild . No evidence of tricuspid stenosis. Aortic Valve: The aortic valve has an indeterminant number of cusps. There is moderate calcification of the aortic valve. There is moderate thickening of the aortic valve. There is moderate aortic valve annular  calcification. Aortic valve regurgitation is not visualized. No aortic stenosis is present. Aortic valve mean gradient measures 7.9 mmHg. Aortic valve peak gradient measures 17.2 mmHg. Aortic valve area, by VTI measures 2.76 cm. Pulmonic Valve: The pulmonic valve was not well visualized. Pulmonic valve regurgitation is not visualized. No evidence of pulmonic stenosis. Aorta: The aortic root is normal in size and structure. Pulmonary Artery: 30. Venous: The inferior vena cava is normal in size with greater than 50% respiratory variability, suggesting right atrial pressure of 3 mmHg. IAS/Shunts: No atrial level shunt detected by color flow Doppler.  LEFT VENTRICLE PLAX 2D LVIDd:         3.82 cm  Diastology LVIDs:         1.93 cm  LV e' medial:    5.55 cm/s LV PW:         1.30 cm  LV E/e' medial:  16.6 LV IVS:        1.24 cm  LV e' lateral:   6.53 cm/s LVOT diam:     1.80 cm  LV E/e' lateral: 14.1 LV SV:         82 LV SV Index:   50 LVOT Area:     2.54 cm  RIGHT VENTRICLE RV S prime:     18.60 cm/s LEFT ATRIUM             Index       RIGHT ATRIUM          Index LA diam:        2.90 cm 1.77 cm/m  RA Area:     5.80 cm LA Vol (A2C):   29.9 ml 18.27 ml/m RA Volume:   7.03 ml  4.30 ml/m LA Vol (A4C):   40.0 ml 24.45 ml/m LA Biplane Vol: 34.7 ml 21.21 ml/m  AORTIC VALVE AV Area (Vmax):    2.52 cm AV Area (Vmean):   2.31 cm AV Area (VTI):     2.76 cm AV Vmax:           207.16 cm/s AV Vmean:          129.255 cm/s AV VTI:            0.297 m AV Peak Grad:      17.2 mmHg AV Mean Grad:      7.9 mmHg LVOT Vmax:  204.83 cm/s LVOT Vmean:        117.376 cm/s LVOT VTI:          0.322 m LVOT/AV VTI ratio: 1.09  AORTA Ao Root diam: 3.00 cm MITRAL VALVE                TRICUSPID VALVE MV Area (PHT): 2.08 cm     TR Peak grad:   29.8 mmHg MV Decel Time: 364 msec     TR Vmax:        273.00 cm/s MV E velocity: 92.00 cm/s MV A velocity: 135.00 cm/s  SHUNTS MV E/A ratio:  0.68         Systemic VTI:  0.32 m                              Systemic Diam: 1.80 cm Carlyle Dolly MD Electronically signed by Carlyle Dolly MD Signature Date/Time: 04/28/2020/11:31:03 AM    Final     Micro Results  Recent Results (from the past 240 hour(s))  Resp Panel by RT-PCR (Flu A&B, Covid) Nasopharyngeal Swab     Status: None   Collection Time: 04/27/20  7:00 PM   Specimen: Nasopharyngeal Swab; Nasopharyngeal(NP) swabs in vial transport medium  Result Value Ref Range Status   SARS Coronavirus 2 by RT PCR NEGATIVE NEGATIVE Final    Comment: (NOTE) SARS-CoV-2 target nucleic acids are NOT DETECTED.  The SARS-CoV-2 RNA is generally detectable in upper respiratory specimens during the acute phase of infection. The lowest concentration of SARS-CoV-2 viral copies this assay can detect is 138 copies/mL. A negative result does not preclude SARS-Cov-2 infection and should not be used as the sole basis for treatment or other patient management decisions. A negative result may occur with  improper specimen collection/handling, submission of specimen other than nasopharyngeal swab, presence of viral mutation(s) within the areas targeted by this assay, and inadequate number of viral copies(<138 copies/mL). A negative result must be combined with clinical observations, patient history, and epidemiological information. The expected result is Negative.  Fact Sheet for Patients:  EntrepreneurPulse.com.au  Fact Sheet for Healthcare Providers:  IncredibleEmployment.be  This test is no t yet approved or cleared by the [place] FDA and  has been authorized for detection and/or diagnosis of SARS-CoV-2 by FDA under an Emergency Use Authorization (EUA). This EUA will remain  in effect (meaning this test can be used) for the duration of the COVID-19 declaration under Section 564(b)(1) of the Act, 21 U.S.C.section 360bbb-3(b)(1), unless the authorization is terminated  or revoked sooner.       Influenza  A by PCR NEGATIVE NEGATIVE Final   Influenza B by PCR NEGATIVE NEGATIVE Final    Comment: (NOTE) The Xpert Xpress SARS-CoV-2/FLU/RSV plus assay is intended as an aid in the diagnosis of influenza from Nasopharyngeal swab specimens and should not be used as a sole basis for treatment. Nasal washings and aspirates are unacceptable for Xpert Xpress SARS-CoV-2/FLU/RSV testing.  Fact Sheet for Patients: EntrepreneurPulse.com.au  Fact Sheet for Healthcare Providers: IncredibleEmployment.be  This test is not yet approved or cleared by the [place] FDA and has been authorized for detection and/or diagnosis of SARS-CoV-2 by FDA under an Emergency Use Authorization (EUA). This EUA will remain in effect (meaning this test can be used) for the duration of the COVID-19 declaration under Section 564(b)(1) of the Act, 21 U.S.C. section 360bbb-3(b)(1), unless the authorization is terminated or revoked.  Performed at  [facility], [address] [place], [place] [address]   Blood Cultures x 2 sites     Status: None   Collection Time: 04/28/20  5:43 AM   Specimen: Left Antecubital; Blood  Result Value Ref Range Status   Specimen Description LEFT ANTECUBITAL  Final   Special Requests   Final    Blood Culture adequate volume BOTTLES DRAWN AEROBIC AND ANAEROBIC   Culture   Final    NO GROWTH 6 DAYS Performed at [facility], [address] [place], [place] [address]    Report Status 05/04/2020 FINAL  Final  Blood Cultures x 2 sites     Status: None   Collection Time: 04/28/20  5:43 AM   Specimen: BLOOD RIGHT HAND  Result Value Ref Range Status   Specimen Description BLOOD RIGHT HAND  Final   Special Requests   Final    BOTTLES DRAWN AEROBIC AND ANAEROBIC Blood Culture adequate volume   Culture   Final    NO GROWTH 6 DAYS Performed at [facility], [address], [place], [place] [address]    Report Status 05/04/2020 FINAL  Final  Aerobic Culture  (superficial specimen)     Status: None   Collection Time: 05/01/20 11:00 AM   Specimen: Wound  Result Value Ref Range Status   Specimen Description   Final    WOUND Performed at [facility], [address] [place], [place] [address]    Special Requests   Final    LEG Performed at [facility], [address], [place], [place] [address]    Gram Stain   Final    RARE WBC PRESENT, PREDOMINANTLY MONONUCLEAR FEW GRAM POSITIVE COCCI RARE GRAM NEGATIVE RODS Performed at [facility], [place] [address], [place], [place] [address]    Culture   Final    ABUNDANT STAPHYLOCOCCUS AUREUS MODERATE ENTEROBACTER CLOACAE MODERATE STENOTROPHOMONAS MALTOPHILIA    Report Status 05/04/2020 FINAL  Final   Organism ID, Bacteria STAPHYLOCOCCUS AUREUS  Final   Organism ID, Bacteria ENTEROBACTER CLOACAE  Final   Organism ID, Bacteria STENOTROPHOMONAS MALTOPHILIA  Final      Susceptibility   Enterobacter cloacae - MIC*    CEFAZOLIN >=64 RESISTANT Resistant     CEFEPIME <=0.12 SENSITIVE Sensitive     CEFTAZIDIME <=1 SENSITIVE Sensitive     CIPROFLOXACIN <=0.25 SENSITIVE Sensitive     GENTAMICIN <=1 SENSITIVE Sensitive     IMIPENEM <=0.25 SENSITIVE Sensitive     TRIMETH/SULFA <=20 SENSITIVE Sensitive     PIP/TAZO <=4 SENSITIVE Sensitive     * MODERATE ENTEROBACTER CLOACAE   Staphylococcus aureus - MIC*    CIPROFLOXACIN <=0.5 SENSITIVE Sensitive     ERYTHROMYCIN >=8 RESISTANT Resistant     GENTAMICIN <=0.5 SENSITIVE Sensitive     OXACILLIN 0.5 SENSITIVE Sensitive     TETRACYCLINE <=1 SENSITIVE Sensitive     VANCOMYCIN <=0.5 SENSITIVE Sensitive     TRIMETH/SULFA <=10 SENSITIVE Sensitive     CLINDAMYCIN <=0.25 SENSITIVE Sensitive     RIFAMPIN <=0.5 SENSITIVE Sensitive     Inducible Clindamycin NEGATIVE Sensitive     * ABUNDANT STAPHYLOCOCCUS AUREUS   Stenotrophomonas maltophilia - MIC*    LEVOFLOXACIN 0.25 SENSITIVE Sensitive     TRIMETH/SULFA <=20 SENSITIVE Sensitive     * MODERATE  STENOTROPHOMONAS MALTOPHILIA  Culture, Urine     Status: None   Collection Time: 05/02/20  5:55 PM   Specimen: Urine, Catheterized  Result Value Ref Range Status   Specimen Description  Final    URINE, CATHETERIZED Performed at [facility], [address] [place], [place] [address]    Special Requests   Final    NONE Performed at [facility], [address] [place], [place] [address]    Culture   Final    NO GROWTH Performed at [facility], [place] [address], [place], [place] [address]    Report Status 05/03/2020 FINAL  Final  MRSA PCR Screening     Status: None   Collection Time: 05/03/20 11:25 AM   Specimen: Nasal Mucosa; Nasopharyngeal  Result Value Ref Range Status   MRSA by PCR NEGATIVE NEGATIVE Final    Comment:        The GeneXpert MRSA Assay (FDA approved for NASAL specimens only), is one component of a comprehensive MRSA colonization surveillance program. It is not intended to diagnose MRSA infection nor to guide or monitor treatment for MRSA infections. Performed at [facility], [address], [place], [place] [address]    Today   [place] today has no new complaints No fever  Or chills   No Nausea, Vomiting or Diarrhea       Patient has been seen and examined prior to discharge   Objective   Blood pressure (!) 109/43, pulse 88, temperature 99.1 F (37.3 C), temperature source Oral, resp. rate (!) 22, height 5\' 3"  (1.6 m), weight 70.4 kg, SpO2 100 %.   Intake/Output Summary (Last 24 hours) at 05/06/2020 1205 Last data filed at 05/06/2020 0900 Gross per 24 hour  Intake 690 ml  Output 950 ml  Net -260 ml    Exam Gen:- Awake Alert, no acute distress  HEENT:- [place].AT, No sclera icterus Neck-Supple Neck,No JVD,.  Lungs-fair, symmetrical air movement, no wheezing no rales CV- S1, S2 normal, regular Abd-  +ve B.Sounds, Abd Soft, No tenderness,    Extremity/Skin:-   good pulses, by report lower extremity wounds and rash  looks better, less swelling, less erythema, no significant drainage at this time, no streaking--see photos in epic Psych-affect is appropriate, oriented x3 Neuro-generalized weakness, no new focal deficits, no tremors  GU- foley in situ   Data Review   CBC w Diff:  Lab Results  Component Value Date   WBC 16.4 (H) 05/06/2020   HGB 7.9 (L) 05/06/2020   HCT 26.7 (L) 05/06/2020   PLT 309 05/06/2020   LYMPHOPCT 13 05/06/2020   BANDSPCT 4 05/05/2020   MONOPCT 6 05/06/2020   EOSPCT 4 05/06/2020   BASOPCT 1 05/06/2020    CMP:  Lab Results  Component Value Date   NA 138 05/06/2020   K 4.1 05/06/2020   CL 112 (H) 05/06/2020   CO2 19 (L) 05/06/2020   BUN 24 (H) 05/06/2020   CREATININE 0.74 05/06/2020   PROT 4.9 (L) 05/06/2020   ALBUMIN 1.3 (L) 05/06/2020   BILITOT 0.2 (L) 05/06/2020   ALKPHOS 97 05/06/2020   AST 30 05/06/2020   ALT 30 05/06/2020  .   Total Discharge time is about 33 minutes  Roxan Hockey M.D on 05/06/2020 at 12:05 PM  Go to [email or web address] -  for contact info  [facility] - Office  [phone]

## 2020-05-06 NOTE — Discharge Instructions (Signed)
1)Avoid ibuprofen/Advil/Aleve/Motrin/Goody Powders/Naproxen/BC powders/Meloxicam/Diclofenac/Indomethacin and other Nonsteroidal anti-inflammatory medications as these will make you more likely to bleed and can cause stomach ulcers, can also cause Kidney problems.   2) hydrochlorothiazide/HCTZ and losartan has been discontinued due to soft blood pressures, and to allow room to titrate up Cardizem  3) okay to take Remeron/mirtazapine 7.5 mg at bedtime for sleep, anxiety and appetite stimulation--- please avoid using more than 15 mg of Remeron due to concerns for risk of excessive sleepiness  4)Please follow-up with Urologist Dr. Alyson Ingles in about 1 week for recheck and follow-up evaluation  in his office----[facility], [address], [address], [place] [address] Phone Number----[phone] -- 5) keep your Foley catheter in and take your Flomax as prescribed until you are seen by urologist  6) please take Cardizem CD 120 mg daily along with amiodarone 200 mg twice a day to keep your heart rate from going too fast due to atrial fibrillation -Okay to take additional Cardizem 30 mg every 12 hours as needed if your heart rate is persistently above 120 bpm  7) wound care for both legs :- dressing Procedure/Placement/Frequency: Cleanse wounds to bilateral anterior lower legs.  Apply santyl to open wounds, apply silvadene cream to non-open areas daily and Cover with Normal saline moist gauze.  Secure with ABD pads and kerlix/tape.  Change daily.   8) outpatient follow-up with a cardiologist advised in about a week or so to determine how long you should be on amiodarone

## 2020-05-06 NOTE — TOC Transition Note (Signed)
Transition of Care [facility]) - CM/SW Discharge Note   Patient Details  Name: [name] [name] MRN: [id number] Date of Birth: [birth year]  [facility] CM/SW Contact:  Natasha Bence, LCSW Phone Number: 05/06/2020, 12:46 PM   Clinical Narrative:    CSW notified of patient's discharge. CSW contacted Barbaraann Rondo with [employer] to follow up with DME. [facility] Tim able to deliver DME equipment form [employer] storage at [facility]. Barbaraann Rondo agreeable to have [facility] deliver equipment to room. CSW completed [employer] delivery ticket. CSW notified Corene Cornea with [employer] of patient's discharge. TOC signing off.    Final next level of care: [place] Barriers to Discharge: Barriers Resolved   Patient Goals and CMS Choice Patient states their goals for this hospitalization and ongoing recovery are:: Return home with [facility] CMS Medicare.gov Compare Post Acute Care list provided to:: Patient Represenative (must comment) (Gene Hegg (husband)) Choice offered to / list presented to : [place]  Discharge Placement                    Patient and family notified of of transfer: 05/06/20  Discharge Plan and Services In-house Referral: Clinical Social Work Discharge Planning Services: NA Post Acute Care Choice: [place]          DME Arranged: Bedside commode,Wheelchair manual DME Agency: [employer] Date DME Agency Contacted: 05/06/20 Time DME Agency Contacted: 1244 Representative spoke with at DME Agency: [place]: RN,PT [place] Agency: [place] ([place]) Date [place]: 05/06/20 Time [place]: 1245 Representative spoke with at [place]: [place] ([place]) Interventions     Readmission Risk Interventions No flowsheet data found.

## 2020-05-07 DIAGNOSIS — R630 Anorexia: Secondary | ICD-10-CM | POA: Diagnosis not present

## 2020-05-07 DIAGNOSIS — I10 Essential (primary) hypertension: Secondary | ICD-10-CM | POA: Diagnosis not present

## 2020-05-07 DIAGNOSIS — Z9181 History of falling: Secondary | ICD-10-CM | POA: Diagnosis not present

## 2020-05-07 DIAGNOSIS — C189 Malignant neoplasm of colon, unspecified: Secondary | ICD-10-CM | POA: Diagnosis not present

## 2020-05-07 DIAGNOSIS — L97812 Non-pressure chronic ulcer of other part of right lower leg with fat layer exposed: Secondary | ICD-10-CM | POA: Diagnosis not present

## 2020-05-07 DIAGNOSIS — Z466 Encounter for fitting and adjustment of urinary device: Secondary | ICD-10-CM | POA: Diagnosis not present

## 2020-05-07 DIAGNOSIS — D649 Anemia, unspecified: Secondary | ICD-10-CM | POA: Diagnosis not present

## 2020-05-07 DIAGNOSIS — I872 Venous insufficiency (chronic) (peripheral): Secondary | ICD-10-CM | POA: Diagnosis not present

## 2020-05-07 DIAGNOSIS — L03116 Cellulitis of left lower limb: Secondary | ICD-10-CM | POA: Diagnosis not present

## 2020-05-07 DIAGNOSIS — R339 Retention of urine, unspecified: Secondary | ICD-10-CM | POA: Diagnosis not present

## 2020-05-07 DIAGNOSIS — M19012 Primary osteoarthritis, left shoulder: Secondary | ICD-10-CM | POA: Diagnosis not present

## 2020-05-07 DIAGNOSIS — I480 Paroxysmal atrial fibrillation: Secondary | ICD-10-CM | POA: Diagnosis not present

## 2020-05-07 DIAGNOSIS — L03115 Cellulitis of right lower limb: Secondary | ICD-10-CM | POA: Diagnosis not present

## 2020-05-07 DIAGNOSIS — M19011 Primary osteoarthritis, right shoulder: Secondary | ICD-10-CM | POA: Diagnosis not present

## 2020-05-07 DIAGNOSIS — N1330 Unspecified hydronephrosis: Secondary | ICD-10-CM | POA: Diagnosis not present

## 2020-05-07 DIAGNOSIS — E440 Moderate protein-calorie malnutrition: Secondary | ICD-10-CM | POA: Diagnosis not present

## 2020-05-07 DIAGNOSIS — L97811 Non-pressure chronic ulcer of other part of right lower leg limited to breakdown of skin: Secondary | ICD-10-CM | POA: Diagnosis not present

## 2020-05-07 DIAGNOSIS — Z7982 Long term (current) use of aspirin: Secondary | ICD-10-CM | POA: Diagnosis not present

## 2020-05-07 DIAGNOSIS — F419 Anxiety disorder, unspecified: Secondary | ICD-10-CM | POA: Diagnosis not present

## 2020-05-07 DIAGNOSIS — N179 Acute kidney failure, unspecified: Secondary | ICD-10-CM | POA: Diagnosis not present

## 2020-05-07 DIAGNOSIS — I6529 Occlusion and stenosis of unspecified carotid artery: Secondary | ICD-10-CM | POA: Diagnosis not present

## 2020-05-08 DIAGNOSIS — L03116 Cellulitis of left lower limb: Secondary | ICD-10-CM | POA: Diagnosis not present

## 2020-05-08 DIAGNOSIS — L97811 Non-pressure chronic ulcer of other part of right lower leg limited to breakdown of skin: Secondary | ICD-10-CM | POA: Diagnosis not present

## 2020-05-08 DIAGNOSIS — N179 Acute kidney failure, unspecified: Secondary | ICD-10-CM | POA: Diagnosis not present

## 2020-05-08 DIAGNOSIS — I872 Venous insufficiency (chronic) (peripheral): Secondary | ICD-10-CM | POA: Diagnosis not present

## 2020-05-08 DIAGNOSIS — L03115 Cellulitis of right lower limb: Secondary | ICD-10-CM | POA: Diagnosis not present

## 2020-05-08 DIAGNOSIS — L97812 Non-pressure chronic ulcer of other part of right lower leg with fat layer exposed: Secondary | ICD-10-CM | POA: Diagnosis not present

## 2020-05-08 LAB — PATHOLOGIST SMEAR REVIEW

## 2020-05-09 ENCOUNTER — Ambulatory Visit (INDEPENDENT_AMBULATORY_CARE_PROVIDER_SITE_OTHER): Payer: Medicare Other | Admitting: Cardiology

## 2020-05-09 ENCOUNTER — Encounter: Payer: Self-pay | Admitting: Cardiology

## 2020-05-09 VITALS — BP 140/82 | HR 67 | Ht 62.0 in | Wt 155.0 lb

## 2020-05-09 DIAGNOSIS — I6523 Occlusion and stenosis of bilateral carotid arteries: Secondary | ICD-10-CM | POA: Diagnosis not present

## 2020-05-09 DIAGNOSIS — I4891 Unspecified atrial fibrillation: Secondary | ICD-10-CM

## 2020-05-09 NOTE — Progress Notes (Signed)
Clinical Summary [name] [name] is a 86 y.o.female seen today for follow up of the following medical problems.  1. Afib - new diagnosis during 04/2020 admission with severe cellulitis, AKI, hypotension - GIven low bp's she was initially started on IV amiodarone, then converted to oral. Converted to SR shortly after starting amio -  With downtrending Hgb and short episode of afib was not started on anticoag.  - no signs of recurrent afib by home vitals checks, compliant with meds   2. Syncope - orthostatic syncope prior to 04/2020 admission in setting of poor oral intake, diuretic.    Past Medical History:  Diagnosis Date  . Arthritis    hands  . Carotid artery occlusion   . Colon cancer ([place])   . Headache(784.0)    occassional headaches  . Hypertension   . Malignant neoplasm of colon ([place])   . Medical history non-contributory      No Known Allergies   Current Outpatient Medications  Medication Sig Dispense Refill  . acetaminophen (TYLENOL) 500 MG tablet Take 1,000 mg by mouth daily as needed for moderate pain or headache.    Marland Kitchen amiodarone (PACERONE) 200 MG tablet Take 1 tablet (200 mg total) by mouth 2 (two) times daily. For Heart rate control 60 tablet 0  . amoxicillin-clavulanate (AUGMENTIN) 875-125 MG tablet Take 1 tablet by mouth 2 (two) times daily for 4 days. 8 tablet 0  . aspirin EC 81 MG tablet Take 1 tablet (81 mg total) by mouth daily with breakfast. 30 tablet 11  . atorvastatin (LIPITOR) 20 MG tablet Take 20 mg by mouth daily.    . collagenase (SANTYL) ointment Apply topically daily. Apply to only Open areas of Leg wounds 30 g 1  . diltiazem (CARDIZEM CD) 120 MG 24 hr capsule Take 1 capsule (120 mg total) by mouth daily. For Heart Rate control 30 capsule 1  . diltiazem (CARDIZEM) 30 MG tablet Take 1 tablet (30 mg total) by mouth every 12 (twelve) hours as needed. For HR >>120BPM 30 tablet 0  . docusate sodium (COLACE) 100 MG capsule Take 200 mg by mouth as needed  for mild constipation.     . gabapentin (NEURONTIN) 100 MG capsule Take 100 mg by mouth 2 (two) times daily.    . mirtazapine (REMERON) 15 MG tablet Take 0.5-1 tablets (7.5-15 mg total) by mouth at bedtime. For Sleep and Appetite Stimulation- 30 tablet 2  . Polyvinyl Alcohol-Povidone (REFRESH OP) Place 1 drop into both eyes daily as needed (dry eyes).    . silver sulfADIAZINE (SILVADENE) 1 % cream Apply 1 application topically daily. Apply to non-open areas of Leg rash 50 g 2  . tamsulosin (FLOMAX) 0.4 MG CAPS capsule Take 1 capsule (0.4 mg total) by mouth daily with supper. 30 capsule 1  . Wheat Dextrin (BENEFIBER DRINK MIX) PACK Take 4 g by mouth at bedtime.     No current facility-administered medications for this visit.     Past Surgical History:  Procedure Laterality Date  . ABDOMINAL HYSTERECTOMY    . APPENDECTOMY    . COLONOSCOPY N/A 08/26/2013   Procedure: COLONOSCOPY;  Surgeon: Rogene Houston, MD;  [facility];  Service: Endoscopy;  Laterality: N/A;  . COLONOSCOPY N/A 11/24/2014   Procedure: COLONOSCOPY;  Surgeon: Rogene Houston, MD;  [facility];  Service: Endoscopy;  Laterality: N/A;  1200  . COLONOSCOPY N/A 03/19/2018   Procedure: COLONOSCOPY;  Surgeon: Rogene Houston, MD;  [facility];  Service: Endoscopy;  Laterality: N/A;  830  . FRACTURE SURGERY     wrist  . INTRAOCULAR LENS IMPLANT, SECONDARY Bilateral 2007   bilateral  . PARTIAL COLECTOMY N/A 08/27/2013   Procedure: RIGHT HEMICOLECTOMY;  Surgeon: Jamesetta So, MD;  Location: [facility];  Service: General;  Laterality: N/A;  . POLYPECTOMY  03/19/2018   Procedure: POLYPECTOMY;  Surgeon: Rogene Houston, MD;  [facility];  Service: Endoscopy;;  colon  . TONSILLECTOMY       No Known Allergies    Family History  Problem Relation Age of Onset  . Hypertension Mother   . Heart disease Mother   . Cancer Brother   . Colon cancer Neg Hx      Social History [name] [name]  reports that she has quit smoking. Her smoking use included cigarettes. She has a 7.50 pack-year smoking history. She has never used smokeless tobacco. [name] [name] reports no history of alcohol use.   Review of Systems CONSTITUTIONAL: No weight loss, fever, chills, weakness or fatigue.  HEENT: Eyes: No visual loss, blurred vision, double vision or yellow sclerae.No hearing loss, sneezing, congestion, runny nose or sore throat.  SKIN: No rash or itching.  CARDIOVASCULAR: per hpi RESPIRATORY: No shortness of breath, cough or sputum.  GASTROINTESTINAL: No anorexia, nausea, vomiting or diarrhea. No abdominal pain or blood.  GENITOURINARY: No burning on urination, no polyuria NEUROLOGICAL: No headache, dizziness, syncope, paralysis, ataxia, numbness or tingling in the extremities. No change in bowel or bladder control.  MUSCULOSKELETAL: No muscle, back pain, joint pain or stiffness.  LYMPHATICS: No enlarged nodes. No history of splenectomy.  PSYCHIATRIC: No history of depression or anxiety.  ENDOCRINOLOGIC: No reports of sweating, cold or heat intolerance. No polyuria or polydipsia.  Marland Kitchen   Physical Examination Today's Vitals   05/09/20 1439  BP: 140/82  Pulse: 67  SpO2: 93%  Weight: 155 lb (70.3 kg)  Height: 5\' 2"  (1.575 m)   Body mass index is 28.35 kg/m.  Gen: resting comfortably, no acute distress HEENT: no scleral icterus, pupils equal round and reactive, no palptable cervical adenopathy,  CV: RRR, no m/r/g, no jvd Resp: Clear to auscultation bilaterally GI: abdomen is soft, non-tender, non-distended, normal bowel sounds, no hepatosplenomegaly MSK: extremities are warm, no edema.  Skin: warm, no rash Neuro:  no focal deficits Psych: appropriate affect     Assessment and Plan  1. Afib - new diagnosis during recent admission in setting of significant systemic illness, unclear if long term issue or isolated event in setting of systemic illness - continue amion 200mg  bid until  Jan 17 then stop - f/u mid Feb, at that time would plan for outpatient monitor 30 days to eval for recurrent afib - has not been on anticoag due to short afib episodes, significant anemia. Would need to reconsider if afib noted on future monitor   F/u mid Feb      Arnoldo Lenis, M.D.

## 2020-05-09 NOTE — Patient Instructions (Signed)
Your physician recommends that you schedule a follow-up appointment in: [place]   Your physician recommends that you continue on your current medications as directed. Please refer to the Current Medication list given to you today.  CONTINUE AMIODARONE 200 MG TWICE DAILY UNTIL 06/05/2020 THEN STOP   Thank you for choosing [facility]!!

## 2020-05-09 NOTE — Addendum Note (Signed)
Addended by: Julian Hy T on: 05/09/2020 04:45 PM   Modules accepted: Orders

## 2020-05-10 DIAGNOSIS — L03115 Cellulitis of right lower limb: Secondary | ICD-10-CM | POA: Diagnosis not present

## 2020-05-10 DIAGNOSIS — L97812 Non-pressure chronic ulcer of other part of right lower leg with fat layer exposed: Secondary | ICD-10-CM | POA: Diagnosis not present

## 2020-05-10 DIAGNOSIS — N179 Acute kidney failure, unspecified: Secondary | ICD-10-CM | POA: Diagnosis not present

## 2020-05-10 DIAGNOSIS — L97811 Non-pressure chronic ulcer of other part of right lower leg limited to breakdown of skin: Secondary | ICD-10-CM | POA: Diagnosis not present

## 2020-05-10 DIAGNOSIS — I872 Venous insufficiency (chronic) (peripheral): Secondary | ICD-10-CM | POA: Diagnosis not present

## 2020-05-10 DIAGNOSIS — L03116 Cellulitis of left lower limb: Secondary | ICD-10-CM | POA: Diagnosis not present

## 2020-05-11 DIAGNOSIS — N179 Acute kidney failure, unspecified: Secondary | ICD-10-CM | POA: Diagnosis not present

## 2020-05-11 DIAGNOSIS — L97812 Non-pressure chronic ulcer of other part of right lower leg with fat layer exposed: Secondary | ICD-10-CM | POA: Diagnosis not present

## 2020-05-11 DIAGNOSIS — I872 Venous insufficiency (chronic) (peripheral): Secondary | ICD-10-CM | POA: Diagnosis not present

## 2020-05-11 DIAGNOSIS — L97811 Non-pressure chronic ulcer of other part of right lower leg limited to breakdown of skin: Secondary | ICD-10-CM | POA: Diagnosis not present

## 2020-05-11 DIAGNOSIS — L03116 Cellulitis of left lower limb: Secondary | ICD-10-CM | POA: Diagnosis not present

## 2020-05-11 DIAGNOSIS — L03115 Cellulitis of right lower limb: Secondary | ICD-10-CM | POA: Diagnosis not present

## 2020-05-15 DIAGNOSIS — I480 Paroxysmal atrial fibrillation: Secondary | ICD-10-CM | POA: Diagnosis not present

## 2020-05-15 DIAGNOSIS — N320 Bladder-neck obstruction: Secondary | ICD-10-CM | POA: Diagnosis not present

## 2020-05-15 DIAGNOSIS — S81809A Unspecified open wound, unspecified lower leg, initial encounter: Secondary | ICD-10-CM | POA: Diagnosis not present

## 2020-05-16 DIAGNOSIS — L97811 Non-pressure chronic ulcer of other part of right lower leg limited to breakdown of skin: Secondary | ICD-10-CM | POA: Diagnosis not present

## 2020-05-16 DIAGNOSIS — I872 Venous insufficiency (chronic) (peripheral): Secondary | ICD-10-CM | POA: Diagnosis not present

## 2020-05-16 DIAGNOSIS — L03116 Cellulitis of left lower limb: Secondary | ICD-10-CM | POA: Diagnosis not present

## 2020-05-16 DIAGNOSIS — L97812 Non-pressure chronic ulcer of other part of right lower leg with fat layer exposed: Secondary | ICD-10-CM | POA: Diagnosis not present

## 2020-05-16 DIAGNOSIS — L03115 Cellulitis of right lower limb: Secondary | ICD-10-CM | POA: Diagnosis not present

## 2020-05-16 DIAGNOSIS — N179 Acute kidney failure, unspecified: Secondary | ICD-10-CM | POA: Diagnosis not present

## 2020-05-17 DIAGNOSIS — I83009 Varicose veins of unspecified lower extremity with ulcer of unspecified site: Secondary | ICD-10-CM | POA: Diagnosis not present

## 2020-05-17 DIAGNOSIS — C44329 Squamous cell carcinoma of skin of other parts of face: Secondary | ICD-10-CM | POA: Diagnosis not present

## 2020-05-17 DIAGNOSIS — I872 Venous insufficiency (chronic) (peripheral): Secondary | ICD-10-CM | POA: Diagnosis not present

## 2020-05-18 DIAGNOSIS — L97812 Non-pressure chronic ulcer of other part of right lower leg with fat layer exposed: Secondary | ICD-10-CM | POA: Diagnosis not present

## 2020-05-18 DIAGNOSIS — L97811 Non-pressure chronic ulcer of other part of right lower leg limited to breakdown of skin: Secondary | ICD-10-CM | POA: Diagnosis not present

## 2020-05-18 DIAGNOSIS — L03116 Cellulitis of left lower limb: Secondary | ICD-10-CM | POA: Diagnosis not present

## 2020-05-18 DIAGNOSIS — L03115 Cellulitis of right lower limb: Secondary | ICD-10-CM | POA: Diagnosis not present

## 2020-05-18 DIAGNOSIS — I872 Venous insufficiency (chronic) (peripheral): Secondary | ICD-10-CM | POA: Diagnosis not present

## 2020-05-18 DIAGNOSIS — N179 Acute kidney failure, unspecified: Secondary | ICD-10-CM | POA: Diagnosis not present

## 2020-05-22 DIAGNOSIS — N179 Acute kidney failure, unspecified: Secondary | ICD-10-CM | POA: Diagnosis not present

## 2020-05-22 DIAGNOSIS — L97811 Non-pressure chronic ulcer of other part of right lower leg limited to breakdown of skin: Secondary | ICD-10-CM | POA: Diagnosis not present

## 2020-05-22 DIAGNOSIS — L97812 Non-pressure chronic ulcer of other part of right lower leg with fat layer exposed: Secondary | ICD-10-CM | POA: Diagnosis not present

## 2020-05-22 DIAGNOSIS — L03116 Cellulitis of left lower limb: Secondary | ICD-10-CM | POA: Diagnosis not present

## 2020-05-22 DIAGNOSIS — L03115 Cellulitis of right lower limb: Secondary | ICD-10-CM | POA: Diagnosis not present

## 2020-05-22 DIAGNOSIS — I872 Venous insufficiency (chronic) (peripheral): Secondary | ICD-10-CM | POA: Diagnosis not present

## 2020-05-23 DIAGNOSIS — N179 Acute kidney failure, unspecified: Secondary | ICD-10-CM | POA: Diagnosis not present

## 2020-05-23 DIAGNOSIS — L97812 Non-pressure chronic ulcer of other part of right lower leg with fat layer exposed: Secondary | ICD-10-CM | POA: Diagnosis not present

## 2020-05-23 DIAGNOSIS — L03116 Cellulitis of left lower limb: Secondary | ICD-10-CM | POA: Diagnosis not present

## 2020-05-23 DIAGNOSIS — L97811 Non-pressure chronic ulcer of other part of right lower leg limited to breakdown of skin: Secondary | ICD-10-CM | POA: Diagnosis not present

## 2020-05-23 DIAGNOSIS — L03115 Cellulitis of right lower limb: Secondary | ICD-10-CM | POA: Diagnosis not present

## 2020-05-23 DIAGNOSIS — I872 Venous insufficiency (chronic) (peripheral): Secondary | ICD-10-CM | POA: Diagnosis not present

## 2020-05-25 DIAGNOSIS — I872 Venous insufficiency (chronic) (peripheral): Secondary | ICD-10-CM | POA: Diagnosis not present

## 2020-05-25 DIAGNOSIS — N179 Acute kidney failure, unspecified: Secondary | ICD-10-CM | POA: Diagnosis not present

## 2020-05-25 DIAGNOSIS — L03115 Cellulitis of right lower limb: Secondary | ICD-10-CM | POA: Diagnosis not present

## 2020-05-25 DIAGNOSIS — L97811 Non-pressure chronic ulcer of other part of right lower leg limited to breakdown of skin: Secondary | ICD-10-CM | POA: Diagnosis not present

## 2020-05-25 DIAGNOSIS — L03116 Cellulitis of left lower limb: Secondary | ICD-10-CM | POA: Diagnosis not present

## 2020-05-25 DIAGNOSIS — L97812 Non-pressure chronic ulcer of other part of right lower leg with fat layer exposed: Secondary | ICD-10-CM | POA: Diagnosis not present

## 2020-05-26 ENCOUNTER — Encounter: Payer: Self-pay | Admitting: Urology

## 2020-05-26 ENCOUNTER — Other Ambulatory Visit: Payer: Self-pay

## 2020-05-26 ENCOUNTER — Ambulatory Visit (INDEPENDENT_AMBULATORY_CARE_PROVIDER_SITE_OTHER): Payer: Medicare Other | Admitting: Urology

## 2020-05-26 VITALS — BP 138/62 | HR 103 | Temp 98.1°F

## 2020-05-26 DIAGNOSIS — N1339 Other hydronephrosis: Secondary | ICD-10-CM

## 2020-05-26 DIAGNOSIS — R339 Retention of urine, unspecified: Secondary | ICD-10-CM | POA: Diagnosis not present

## 2020-05-26 MED ORDER — CIPROFLOXACIN HCL 500 MG PO TABS
500.0000 mg | ORAL_TABLET | Freq: Once | ORAL | Status: AC
  Administered 2020-05-26: 500 mg via ORAL

## 2020-05-26 NOTE — Progress Notes (Signed)
Fill and Pull Catheter Removal  Patient is present today for a catheter removal.  Patient was cleaned and prepped in a sterile fashion 380ml of sterile water/ saline was instilled into the bladder when the patient felt the urge to urinate. 37ml of water was then drained from the balloon.  A 16FR foley cath was removed from the bladder no complications were noted .  Patient as then given some time to void on their own.  Patient can void  248ml on their own after some time.  Patient tolerated well.  Performed by: Estill Bamberg RN  Follow up/ Additional notes:   Uroflow  Peak Flow: 23ml Average Flow: 4ml Voided Volume: 270ml Voiding Time: 95sec Flow Time: 69sec Time to Peak Flow: 47sec    Urological Symptom Review  Patient is experiencing the following symptoms: Injury to kidneys/bladder   Review of Systems  Gastrointestinal (upper)  : Negative for upper GI symptoms  Gastrointestinal (lower) : Constipation  Constitutional : Fatigue  Skin: Skin rash/lesion  Eyes: Negative for eye symptoms  Ear/Nose/Throat : Negative for Ear/Nose/Throat symptoms  Hematologic/Lymphatic: Negative for Hematologic/Lymphatic symptoms  Cardiovascular : Leg swelling  Respiratory : Negative for respiratory symptoms  Endocrine: Negative for endocrine symptoms  Musculoskeletal: Negative for musculoskeletal symptoms  Neurological: Negative for neurological symptoms  Psychologic: Negative for psychiatric symptoms

## 2020-05-26 NOTE — Patient Instructions (Signed)
Acute Urinary Retention, Female  Acute urinary retention means that you cannot pee (urinate) at all, or that you pee too little and your bladder is not emptied completely. If it is not treated, it can lead to kidney damage or other serious problems. Follow these instructions at home:  Take over-the-counter and prescription medicines only as told by your doctor. Ask your doctor what medicines you should stay away from. Do not take any medicine unless your doctor says it is okay to do so.  If you were sent home with a tube that drains pee from the bladder (catheter), take care of it as told by your doctor.  Drink enough fluid to keep your pee clear or pale yellow.  If you were given an antibiotic, take it as told by your doctor. Do not stop taking the antibiotic even if you start to feel better.  Do not use any products that contain nicotine or tobacco, such as cigarettes and e-cigarettes. If you need help quitting, ask your doctor.  Watch for changes in your symptoms. Tell your doctor about them.  If told, keep track of any changes in your blood pressure at home. Tell your doctor about them.  Keep all follow-up visits as told by your doctor. This is important. Contact a doctor if:  You have spasms or you leak pee when you have spasms. Get help right away if:  You have chills or a fever.  You have blood in your pee.  You have a tube that drains the bladder and: ? The tube stops draining pee. ? The tube falls out. Summary  Acute urinary retention means that you cannot pee at all, or that you pee too little and your bladder is not emptied completely. If it is not treated, it can result in kidney damage or other serious problems.  If you were sent home with a tube that drains pee from the bladder, take care of it as told by your doctor.  Pay attention to any changes in your symptoms. Tell your doctor about them. This information is not intended to replace advice given to you by your  health care provider. Make sure you discuss any questions you have with your health care provider. Document Revised: 04/18/2017 Document Reviewed: 06/07/2016 Elsevier Patient Education  2020 Elsevier Inc[employer]  

## 2020-05-26 NOTE — Progress Notes (Signed)
H&P  Chief Complaint:   History of Present Illness: [name] [name] is an 87 year old female who was admitted to the hospital a few weeks ago with dehydration and syncope.  Her creatinine was 3.12 up from baseline of 0.8.  Ultrasound revealed mild bilateral hydro nephrosis in the bladder contained about 800 mL.  Foley catheter was placed and her creatinine declined to baseline at 0.74.  Urine culture showed no growth.  She was started on tamsulosin.  Today she is well.  She is ambulating with a walker.  She is not constipated.  Urine clear.  She denies any prior urologic history, medicines or surgery. She is s/p hysterectomy.   Past Medical History:  Diagnosis Date  . Arthritis    hands  . Carotid artery occlusion   . Colon cancer ([place])   . Headache(784.0)    occassional headaches  . Hypertension   . Malignant neoplasm of colon ([place])   . Medical history non-contributory    Past Surgical History:  Procedure Laterality Date  . ABDOMINAL HYSTERECTOMY    . APPENDECTOMY    . COLONOSCOPY N/A 08/26/2013   Procedure: COLONOSCOPY;  Surgeon: Rogene Houston, MD;  [facility];  Service: Endoscopy;  Laterality: N/A;  . COLONOSCOPY N/A 11/24/2014   Procedure: COLONOSCOPY;  Surgeon: Rogene Houston, MD;  [facility];  Service: Endoscopy;  Laterality: N/A;  1200  . COLONOSCOPY N/A 03/19/2018   Procedure: COLONOSCOPY;  Surgeon: Rogene Houston, MD;  [facility];  Service: Endoscopy;  Laterality: N/A;  830  . FRACTURE SURGERY     wrist  . INTRAOCULAR LENS IMPLANT, SECONDARY Bilateral 2007   bilateral  . PARTIAL COLECTOMY N/A 08/27/2013   Procedure: RIGHT HEMICOLECTOMY;  Surgeon: Jamesetta So, MD;  Location: [facility];  Service: General;  Laterality: N/A;  . POLYPECTOMY  03/19/2018   Procedure: POLYPECTOMY;  Surgeon: Rogene Houston, MD;  [facility];  Service: Endoscopy;;  colon  . TONSILLECTOMY      Home Medications:  (Not in a hospital  admission)  Allergies: No Known Allergies  Family History  Problem Relation Age of Onset  . Hypertension Mother   . Heart disease Mother   . Cancer Brother   . Colon cancer Neg Hx    Social History:  reports that she has quit smoking. Her smoking use included cigarettes. She has a 7.50 pack-year smoking history. She has never used smokeless tobacco. She reports that she does not drink alcohol and does not use drugs.  ROS: A complete review of systems was performed.  All systems are negative except for pertinent findings as noted. ROS   Physical Exam:  Vital signs in last 24 hours: @VSRANGES @ General:  Alert and oriented, No acute distress HEENT: Normocephalic, atraumatic Cardiovascular: Regular rate and rhythm Lungs: Regular rate and effort Abdomen: Soft, nontender, nondistended, no abdominal masses Back: No CVA tenderness Extremities: No edema Neurologic: Grossly intact GU: Foley in place, urine clear  Laboratory Data:  No results found for this or any previous visit (from the past 24 hour(s)). No results found for this or any previous visit (from the past 240 hour(s)). Creatinine: No results for input(s): CREATININE in the last 168 hours.  Imaging: I reviewed the renal ultrasound images.  I calculated the bladder volume based on the images.  Impression/Assessment/plan: Urinary retention- Voiding trial today successful.  She was filled with 300 and voided 250.  She was covered with the Cipro.  We  will see her back for nurse visit in a week for a PVR check and then as needed if she is doing well.  Hydronephrosis-the hydronephrosis was mild on the ultrasound with a distended bladder.  Her creatinine responded quickly when the catheter was placed consistent with reflux.  I do not feel like she needs follow-up imaging.  Festus Aloe 05/26/2020, 8:59 AM

## 2020-05-29 DIAGNOSIS — L03115 Cellulitis of right lower limb: Secondary | ICD-10-CM | POA: Diagnosis not present

## 2020-05-29 DIAGNOSIS — L03119 Cellulitis of unspecified part of limb: Secondary | ICD-10-CM | POA: Diagnosis not present

## 2020-05-29 DIAGNOSIS — I872 Venous insufficiency (chronic) (peripheral): Secondary | ICD-10-CM | POA: Diagnosis not present

## 2020-05-29 DIAGNOSIS — L03116 Cellulitis of left lower limb: Secondary | ICD-10-CM | POA: Diagnosis not present

## 2020-05-29 DIAGNOSIS — N179 Acute kidney failure, unspecified: Secondary | ICD-10-CM | POA: Diagnosis not present

## 2020-05-29 DIAGNOSIS — L97811 Non-pressure chronic ulcer of other part of right lower leg limited to breakdown of skin: Secondary | ICD-10-CM | POA: Diagnosis not present

## 2020-05-29 DIAGNOSIS — L97812 Non-pressure chronic ulcer of other part of right lower leg with fat layer exposed: Secondary | ICD-10-CM | POA: Diagnosis not present

## 2020-05-29 DIAGNOSIS — I480 Paroxysmal atrial fibrillation: Secondary | ICD-10-CM | POA: Diagnosis not present

## 2020-05-30 DIAGNOSIS — N179 Acute kidney failure, unspecified: Secondary | ICD-10-CM | POA: Diagnosis not present

## 2020-05-30 DIAGNOSIS — L97812 Non-pressure chronic ulcer of other part of right lower leg with fat layer exposed: Secondary | ICD-10-CM | POA: Diagnosis not present

## 2020-05-30 DIAGNOSIS — I872 Venous insufficiency (chronic) (peripheral): Secondary | ICD-10-CM | POA: Diagnosis not present

## 2020-05-30 DIAGNOSIS — L03115 Cellulitis of right lower limb: Secondary | ICD-10-CM | POA: Diagnosis not present

## 2020-05-30 DIAGNOSIS — L03116 Cellulitis of left lower limb: Secondary | ICD-10-CM | POA: Diagnosis not present

## 2020-05-30 DIAGNOSIS — L97811 Non-pressure chronic ulcer of other part of right lower leg limited to breakdown of skin: Secondary | ICD-10-CM | POA: Diagnosis not present

## 2020-06-01 DIAGNOSIS — L03115 Cellulitis of right lower limb: Secondary | ICD-10-CM | POA: Diagnosis not present

## 2020-06-01 DIAGNOSIS — L03119 Cellulitis of unspecified part of limb: Secondary | ICD-10-CM | POA: Diagnosis not present

## 2020-06-01 DIAGNOSIS — L97811 Non-pressure chronic ulcer of other part of right lower leg limited to breakdown of skin: Secondary | ICD-10-CM | POA: Diagnosis not present

## 2020-06-01 DIAGNOSIS — L03116 Cellulitis of left lower limb: Secondary | ICD-10-CM | POA: Diagnosis not present

## 2020-06-01 DIAGNOSIS — N179 Acute kidney failure, unspecified: Secondary | ICD-10-CM | POA: Diagnosis not present

## 2020-06-01 DIAGNOSIS — L97812 Non-pressure chronic ulcer of other part of right lower leg with fat layer exposed: Secondary | ICD-10-CM | POA: Diagnosis not present

## 2020-06-01 DIAGNOSIS — I480 Paroxysmal atrial fibrillation: Secondary | ICD-10-CM | POA: Diagnosis not present

## 2020-06-01 DIAGNOSIS — I872 Venous insufficiency (chronic) (peripheral): Secondary | ICD-10-CM | POA: Diagnosis not present

## 2020-06-02 ENCOUNTER — Ambulatory Visit (INDEPENDENT_AMBULATORY_CARE_PROVIDER_SITE_OTHER): Payer: Medicare Other

## 2020-06-02 ENCOUNTER — Other Ambulatory Visit: Payer: Self-pay

## 2020-06-02 DIAGNOSIS — R339 Retention of urine, unspecified: Secondary | ICD-10-CM | POA: Diagnosis not present

## 2020-06-02 LAB — URINALYSIS, ROUTINE W REFLEX MICROSCOPIC
Bilirubin, UA: NEGATIVE
Glucose, UA: NEGATIVE
Ketones, UA: NEGATIVE
Nitrite, UA: NEGATIVE
Protein,UA: NEGATIVE
RBC, UA: NEGATIVE
Specific Gravity, UA: 1.015 (ref 1.005–1.030)
Urobilinogen, Ur: 0.2 mg/dL (ref 0.2–1.0)
pH, UA: 6 (ref 5.0–7.5)

## 2020-06-02 LAB — MICROSCOPIC EXAMINATION
Epithelial Cells (non renal): >10 /hpf — AB (ref 0–10)
RBC, Urine: NONE SEEN /hpf (ref 0–2)
Renal Epithel, UA: NONE SEEN /hpf

## 2020-06-06 DIAGNOSIS — R339 Retention of urine, unspecified: Secondary | ICD-10-CM | POA: Diagnosis not present

## 2020-06-06 DIAGNOSIS — Z7982 Long term (current) use of aspirin: Secondary | ICD-10-CM | POA: Diagnosis not present

## 2020-06-06 DIAGNOSIS — N179 Acute kidney failure, unspecified: Secondary | ICD-10-CM | POA: Diagnosis not present

## 2020-06-06 DIAGNOSIS — R630 Anorexia: Secondary | ICD-10-CM | POA: Diagnosis not present

## 2020-06-06 DIAGNOSIS — E440 Moderate protein-calorie malnutrition: Secondary | ICD-10-CM | POA: Diagnosis not present

## 2020-06-06 DIAGNOSIS — F419 Anxiety disorder, unspecified: Secondary | ICD-10-CM | POA: Diagnosis not present

## 2020-06-06 DIAGNOSIS — D649 Anemia, unspecified: Secondary | ICD-10-CM | POA: Diagnosis not present

## 2020-06-06 DIAGNOSIS — M19011 Primary osteoarthritis, right shoulder: Secondary | ICD-10-CM | POA: Diagnosis not present

## 2020-06-06 DIAGNOSIS — M19012 Primary osteoarthritis, left shoulder: Secondary | ICD-10-CM | POA: Diagnosis not present

## 2020-06-06 DIAGNOSIS — L03115 Cellulitis of right lower limb: Secondary | ICD-10-CM | POA: Diagnosis not present

## 2020-06-06 DIAGNOSIS — L97811 Non-pressure chronic ulcer of other part of right lower leg limited to breakdown of skin: Secondary | ICD-10-CM | POA: Diagnosis not present

## 2020-06-06 DIAGNOSIS — N1330 Unspecified hydronephrosis: Secondary | ICD-10-CM | POA: Diagnosis not present

## 2020-06-06 DIAGNOSIS — L97812 Non-pressure chronic ulcer of other part of right lower leg with fat layer exposed: Secondary | ICD-10-CM | POA: Diagnosis not present

## 2020-06-06 DIAGNOSIS — I6529 Occlusion and stenosis of unspecified carotid artery: Secondary | ICD-10-CM | POA: Diagnosis not present

## 2020-06-06 DIAGNOSIS — C189 Malignant neoplasm of colon, unspecified: Secondary | ICD-10-CM | POA: Diagnosis not present

## 2020-06-06 DIAGNOSIS — L03116 Cellulitis of left lower limb: Secondary | ICD-10-CM | POA: Diagnosis not present

## 2020-06-06 DIAGNOSIS — I10 Essential (primary) hypertension: Secondary | ICD-10-CM | POA: Diagnosis not present

## 2020-06-06 DIAGNOSIS — I872 Venous insufficiency (chronic) (peripheral): Secondary | ICD-10-CM | POA: Diagnosis not present

## 2020-06-06 DIAGNOSIS — I480 Paroxysmal atrial fibrillation: Secondary | ICD-10-CM | POA: Diagnosis not present

## 2020-06-06 DIAGNOSIS — Z9181 History of falling: Secondary | ICD-10-CM | POA: Diagnosis not present

## 2020-06-06 DIAGNOSIS — Z466 Encounter for fitting and adjustment of urinary device: Secondary | ICD-10-CM | POA: Diagnosis not present

## 2020-06-07 DIAGNOSIS — L03116 Cellulitis of left lower limb: Secondary | ICD-10-CM | POA: Diagnosis not present

## 2020-06-07 DIAGNOSIS — L03115 Cellulitis of right lower limb: Secondary | ICD-10-CM | POA: Diagnosis not present

## 2020-06-07 DIAGNOSIS — L97812 Non-pressure chronic ulcer of other part of right lower leg with fat layer exposed: Secondary | ICD-10-CM | POA: Diagnosis not present

## 2020-06-07 DIAGNOSIS — L97811 Non-pressure chronic ulcer of other part of right lower leg limited to breakdown of skin: Secondary | ICD-10-CM | POA: Diagnosis not present

## 2020-06-07 DIAGNOSIS — I872 Venous insufficiency (chronic) (peripheral): Secondary | ICD-10-CM | POA: Diagnosis not present

## 2020-06-07 DIAGNOSIS — N179 Acute kidney failure, unspecified: Secondary | ICD-10-CM | POA: Diagnosis not present

## 2020-06-09 DIAGNOSIS — I872 Venous insufficiency (chronic) (peripheral): Secondary | ICD-10-CM | POA: Diagnosis not present

## 2020-06-09 DIAGNOSIS — L97811 Non-pressure chronic ulcer of other part of right lower leg limited to breakdown of skin: Secondary | ICD-10-CM | POA: Diagnosis not present

## 2020-06-09 DIAGNOSIS — L97812 Non-pressure chronic ulcer of other part of right lower leg with fat layer exposed: Secondary | ICD-10-CM | POA: Diagnosis not present

## 2020-06-09 DIAGNOSIS — L03115 Cellulitis of right lower limb: Secondary | ICD-10-CM | POA: Diagnosis not present

## 2020-06-09 DIAGNOSIS — L03116 Cellulitis of left lower limb: Secondary | ICD-10-CM | POA: Diagnosis not present

## 2020-06-09 DIAGNOSIS — N179 Acute kidney failure, unspecified: Secondary | ICD-10-CM | POA: Diagnosis not present

## 2020-06-13 DIAGNOSIS — Z08 Encounter for follow-up examination after completed treatment for malignant neoplasm: Secondary | ICD-10-CM | POA: Diagnosis not present

## 2020-06-13 DIAGNOSIS — Z85828 Personal history of other malignant neoplasm of skin: Secondary | ICD-10-CM | POA: Diagnosis not present

## 2020-06-13 DIAGNOSIS — I872 Venous insufficiency (chronic) (peripheral): Secondary | ICD-10-CM | POA: Diagnosis not present

## 2020-06-19 ENCOUNTER — Encounter (HOSPITAL_COMMUNITY): Payer: Self-pay | Admitting: Physical Therapy

## 2020-06-19 DIAGNOSIS — R339 Retention of urine, unspecified: Secondary | ICD-10-CM | POA: Diagnosis not present

## 2020-06-19 DIAGNOSIS — S81809A Unspecified open wound, unspecified lower leg, initial encounter: Secondary | ICD-10-CM | POA: Diagnosis not present

## 2020-06-19 NOTE — Therapy (Signed)
[place], [place], [address] Phone: [phone]   Fax:  [phone]  Patient Details  Name: [name] [name] MRN: [id number] Date of Birth: 10/13/1931 Referring Provider:  No ref. provider found  Encounter Date: 06/19/2020    PHYSICAL THERAPY DISCHARGE SUMMARY  Visits from Start of Care: 10  Current functional level related to goals / functional outcomes:  Unknown as pt did not return     Remaining deficits:   Unknown as pt did not return    Education / Equipment: Contact MD if fever, LE becomes red or swollen Plan: Patient agrees to discharge.  Patient goals were not met. Patient is being discharged due to a change in medical status.  ?????     Rayetta Humphrey, PT CLT [phone] 06/19/2020, 11:14 AM  [place], [place], [address] Phone: [phone]   Fax:  [phone]

## 2020-06-21 ENCOUNTER — Telehealth: Payer: Self-pay | Admitting: Urology

## 2020-06-21 NOTE — Telephone Encounter (Signed)
Offered pt earlier appt but daughter wasn't able to bring her due to work.

## 2020-06-30 ENCOUNTER — Telehealth: Payer: Self-pay

## 2020-06-30 ENCOUNTER — Ambulatory Visit: Payer: Medicare Other

## 2020-06-30 NOTE — Telephone Encounter (Signed)
Reviewing patient chart this am for appt regarding cath self teaching.  Patient last seen by Dr. Junious Silk on 1/7 passing her voiding trial.  Patient then returned per chart on 1/14 for PVR- PVR value not found in chart.  I spoke with scheduling, Caryl Pina, to have daughter bring patient back to see Dr. Junious Silk if her incontinence/retention was returning. She would need MD evaluation since last seen by MD and passed voiding trial.   Caryl Pina notified daughter.

## 2020-07-03 DIAGNOSIS — S0990XA Unspecified injury of head, initial encounter: Secondary | ICD-10-CM | POA: Diagnosis not present

## 2020-07-03 DIAGNOSIS — R55 Syncope and collapse: Secondary | ICD-10-CM | POA: Diagnosis not present

## 2020-07-03 DIAGNOSIS — S066X0A Traumatic subarachnoid hemorrhage without loss of consciousness, initial encounter: Secondary | ICD-10-CM | POA: Diagnosis not present

## 2020-07-03 DIAGNOSIS — S0219XA Other fracture of base of skull, initial encounter for closed fracture: Secondary | ICD-10-CM | POA: Diagnosis not present

## 2020-07-03 DIAGNOSIS — Z515 Encounter for palliative care: Secondary | ICD-10-CM | POA: Diagnosis not present

## 2020-07-03 DIAGNOSIS — S06360A Traumatic hemorrhage of cerebrum, unspecified, without loss of consciousness, initial encounter: Secondary | ICD-10-CM | POA: Diagnosis not present

## 2020-07-03 DIAGNOSIS — G9389 Other specified disorders of brain: Secondary | ICD-10-CM | POA: Diagnosis not present

## 2020-07-03 DIAGNOSIS — R402412 Glasgow coma scale score 13-15, at arrival to emergency department: Secondary | ICD-10-CM | POA: Diagnosis not present

## 2020-07-03 DIAGNOSIS — I700 Atherosclerosis of aorta: Secondary | ICD-10-CM | POA: Diagnosis not present

## 2020-07-03 DIAGNOSIS — S02119A Unspecified fracture of occiput, initial encounter for closed fracture: Secondary | ICD-10-CM | POA: Diagnosis not present

## 2020-07-03 DIAGNOSIS — S065X9A Traumatic subdural hemorrhage with loss of consciousness of unspecified duration, initial encounter: Secondary | ICD-10-CM | POA: Diagnosis not present

## 2020-07-03 DIAGNOSIS — S066X1A Traumatic subarachnoid hemorrhage with loss of consciousness of 30 minutes or less, initial encounter: Secondary | ICD-10-CM | POA: Diagnosis not present

## 2020-07-03 DIAGNOSIS — R9431 Abnormal electrocardiogram [ECG] [EKG]: Secondary | ICD-10-CM | POA: Diagnosis not present

## 2020-07-03 DIAGNOSIS — R4020 Unspecified coma: Secondary | ICD-10-CM | POA: Diagnosis not present

## 2020-07-03 DIAGNOSIS — R404 Transient alteration of awareness: Secondary | ICD-10-CM | POA: Diagnosis not present

## 2020-07-03 DIAGNOSIS — S199XXA Unspecified injury of neck, initial encounter: Secondary | ICD-10-CM | POA: Diagnosis not present

## 2020-07-03 DIAGNOSIS — M47812 Spondylosis without myelopathy or radiculopathy, cervical region: Secondary | ICD-10-CM | POA: Diagnosis not present

## 2020-07-03 DIAGNOSIS — Z20822 Contact with and (suspected) exposure to covid-19: Secondary | ICD-10-CM | POA: Diagnosis not present

## 2020-07-03 DIAGNOSIS — W010XXA Fall on same level from slipping, tripping and stumbling without subsequent striking against object, initial encounter: Secondary | ICD-10-CM | POA: Diagnosis not present

## 2020-07-03 DIAGNOSIS — Z043 Encounter for examination and observation following other accident: Secondary | ICD-10-CM | POA: Diagnosis not present

## 2020-07-03 DIAGNOSIS — S0281XA Fracture of other specified skull and facial bones, right side, initial encounter for closed fracture: Secondary | ICD-10-CM | POA: Diagnosis not present

## 2020-07-03 DIAGNOSIS — N3289 Other specified disorders of bladder: Secondary | ICD-10-CM | POA: Diagnosis not present

## 2020-07-03 DIAGNOSIS — I618 Other nontraumatic intracerebral hemorrhage: Secondary | ICD-10-CM | POA: Diagnosis not present

## 2020-07-03 DIAGNOSIS — S0003XA Contusion of scalp, initial encounter: Secondary | ICD-10-CM | POA: Diagnosis not present

## 2020-07-03 DIAGNOSIS — S066X9A Traumatic subarachnoid hemorrhage with loss of consciousness of unspecified duration, initial encounter: Secondary | ICD-10-CM | POA: Diagnosis not present

## 2020-07-03 DIAGNOSIS — S065X0A Traumatic subdural hemorrhage without loss of consciousness, initial encounter: Secondary | ICD-10-CM | POA: Diagnosis not present

## 2020-07-03 DIAGNOSIS — J90 Pleural effusion, not elsewhere classified: Secondary | ICD-10-CM | POA: Diagnosis not present

## 2020-07-03 DIAGNOSIS — M47816 Spondylosis without myelopathy or radiculopathy, lumbar region: Secondary | ICD-10-CM | POA: Diagnosis not present

## 2020-07-03 DIAGNOSIS — W1839XA Other fall on same level, initial encounter: Secondary | ICD-10-CM | POA: Diagnosis not present

## 2020-07-03 DIAGNOSIS — Y998 Other external cause status: Secondary | ICD-10-CM | POA: Diagnosis not present

## 2020-07-03 DIAGNOSIS — R410 Disorientation, unspecified: Secondary | ICD-10-CM | POA: Diagnosis not present

## 2020-07-04 DIAGNOSIS — S02119A Unspecified fracture of occiput, initial encounter for closed fracture: Secondary | ICD-10-CM | POA: Diagnosis not present

## 2020-07-04 DIAGNOSIS — C189 Malignant neoplasm of colon, unspecified: Secondary | ICD-10-CM | POA: Diagnosis not present

## 2020-07-04 DIAGNOSIS — I609 Nontraumatic subarachnoid hemorrhage, unspecified: Secondary | ICD-10-CM | POA: Diagnosis not present

## 2020-07-04 DIAGNOSIS — M1990 Unspecified osteoarthritis, unspecified site: Secondary | ICD-10-CM | POA: Diagnosis not present

## 2020-07-04 DIAGNOSIS — S0219XA Other fracture of base of skull, initial encounter for closed fracture: Secondary | ICD-10-CM | POA: Diagnosis not present

## 2020-07-04 DIAGNOSIS — S065X9A Traumatic subdural hemorrhage with loss of consciousness of unspecified duration, initial encounter: Secondary | ICD-10-CM | POA: Diagnosis not present

## 2020-07-04 DIAGNOSIS — R464 Slowness and poor responsiveness: Secondary | ICD-10-CM | POA: Diagnosis not present

## 2020-07-04 DIAGNOSIS — I480 Paroxysmal atrial fibrillation: Secondary | ICD-10-CM | POA: Diagnosis not present

## 2020-07-04 DIAGNOSIS — S065X0A Traumatic subdural hemorrhage without loss of consciousness, initial encounter: Secondary | ICD-10-CM | POA: Diagnosis not present

## 2020-07-04 DIAGNOSIS — S066X9A Traumatic subarachnoid hemorrhage with loss of consciousness of unspecified duration, initial encounter: Secondary | ICD-10-CM | POA: Diagnosis not present

## 2020-07-04 DIAGNOSIS — S066X0A Traumatic subarachnoid hemorrhage without loss of consciousness, initial encounter: Secondary | ICD-10-CM | POA: Diagnosis not present

## 2020-07-04 DIAGNOSIS — I10 Essential (primary) hypertension: Secondary | ICD-10-CM | POA: Diagnosis not present

## 2020-07-04 DIAGNOSIS — N179 Acute kidney failure, unspecified: Secondary | ICD-10-CM | POA: Diagnosis not present

## 2020-07-04 NOTE — Telephone Encounter (Signed)
Opened in error

## 2020-07-05 DIAGNOSIS — N179 Acute kidney failure, unspecified: Secondary | ICD-10-CM | POA: Diagnosis not present

## 2020-07-05 DIAGNOSIS — M1990 Unspecified osteoarthritis, unspecified site: Secondary | ICD-10-CM | POA: Diagnosis not present

## 2020-07-05 DIAGNOSIS — I609 Nontraumatic subarachnoid hemorrhage, unspecified: Secondary | ICD-10-CM | POA: Diagnosis not present

## 2020-07-05 DIAGNOSIS — I480 Paroxysmal atrial fibrillation: Secondary | ICD-10-CM | POA: Diagnosis not present

## 2020-07-05 DIAGNOSIS — C189 Malignant neoplasm of colon, unspecified: Secondary | ICD-10-CM | POA: Diagnosis not present

## 2020-07-05 DIAGNOSIS — I10 Essential (primary) hypertension: Secondary | ICD-10-CM | POA: Diagnosis not present

## 2020-07-13 ENCOUNTER — Ambulatory Visit: Payer: Medicare Other | Admitting: Cardiology

## 2020-07-18 DEATH — deceased

## 2020-08-17 ENCOUNTER — Ambulatory Visit: Payer: Medicare Other | Admitting: Cardiology

## 2022-05-26 IMAGING — MR MR [PERSON_NAME] LOW W/O CM*R*
12 series · 40 of 40 positions shown · non-contrast
Comparison: None.

CLINICAL DATA: Bilateral lower extremity wounds. Evaluate for deep
soft tissue infection or osteomyelitis.

EXAM:
MRI OF LOWER LEFT EXTREMITY WITHOUT CONTRAST; MRI OF LOWER RIGHT
EXTREMITY WITHOUT CONTRAST
TECHNIQUE: Multiplanar, multisequence MR imaging of the bilateral lower legs
suspicious was performed. No intravenous contrast was administered.

[Series 2: T1 · coronal · B · 5.0mm · 1.25mm/px · 2 of 24 slices shown (1 of 4)]
[im 1/24]
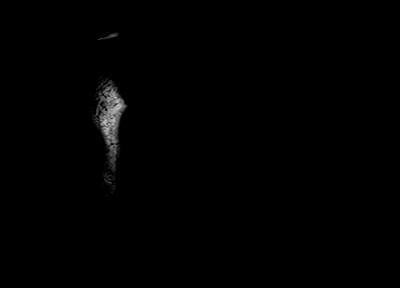
[im 24/24]
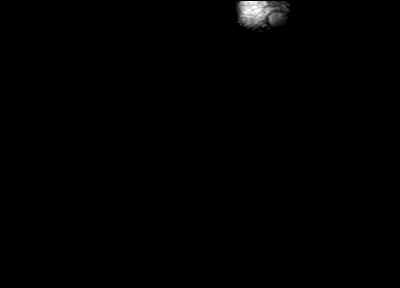

[Series 3: T1 · coronal · B · 5.0mm · 1.25mm/px · 2 of 24 slices shown (2 of 4)]
[im 1/24]
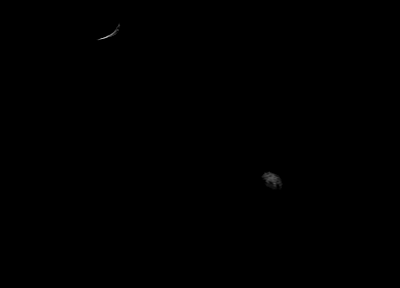
[im 24/24]
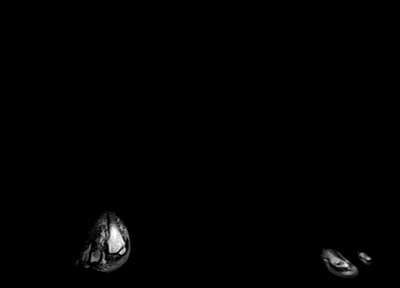

[Series 4: STIR · coronal · B · 5.0mm · 1.25mm/px · 2 of 24 slices shown (1 of 2)]
[im 1/24]
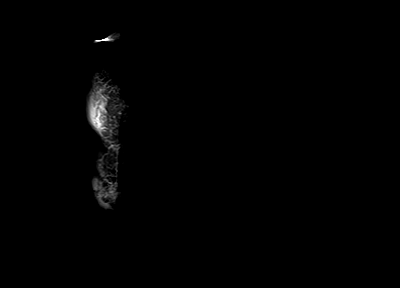
[im 24/24]
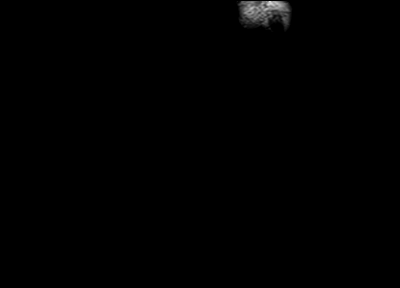

[Series 5: STIR · coronal · B · 5.0mm · 1.25mm/px · 2 of 19 slices shown (2 of 2)]
[im 1/19]
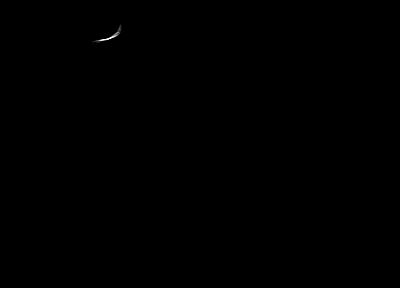
[im 19/19]
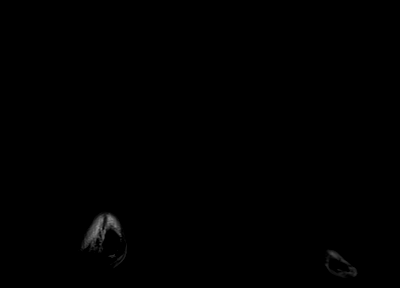

[Series 6: T1 · axial · B · 4.0mm · 1.72mm/px · z∈[-0,+175]mm · 3 of 36 slices shown (3 of 4)]
[im 1/36]
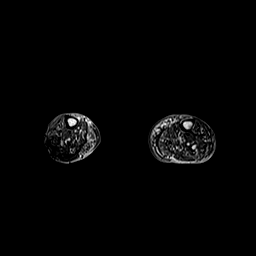
[im 18/36]
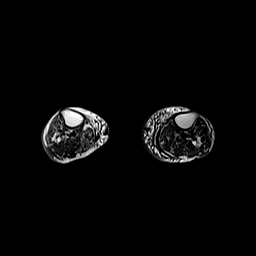
[im 36/36]
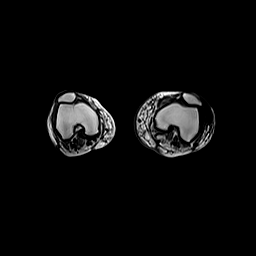

[Series 7: T1 · axial · B · 4.0mm · 1.72mm/px · z∈[-240,+15]mm · 4 of 52 slices shown (4 of 4)]
[im 1/52]
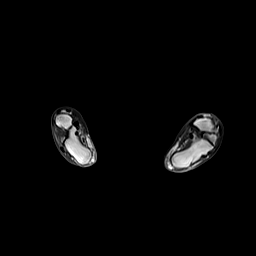
[im 18/52]
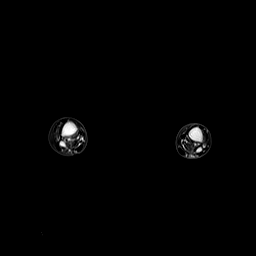
[im 35/52]
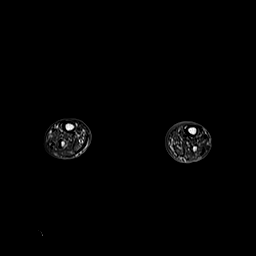
[im 52/52]
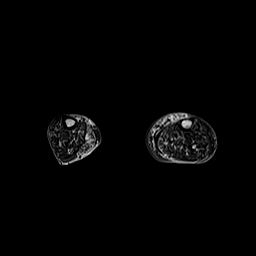

[Series 8: ax t1_comp_filt · axial · B · 4.0mm · 1.72mm/px · z∈[-240,+175]mm · 7 of 84 slices shown]
[im 1/84]
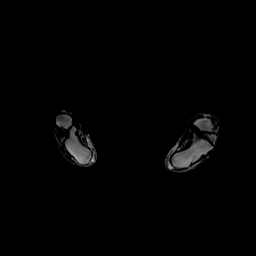
[im 14/84]
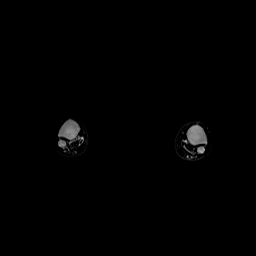
[im 28/84]
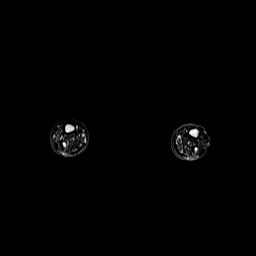
[im 42/84]
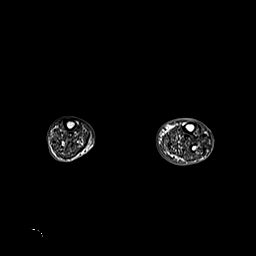
[im 56/84]
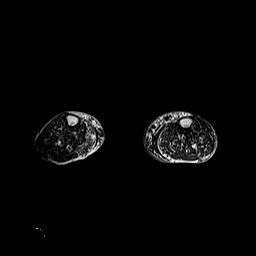
[im 70/84]
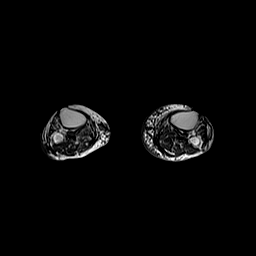
[im 84/84]
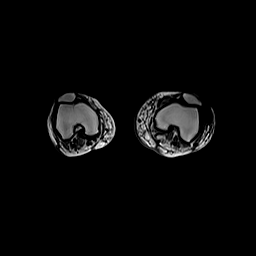

[Series 9: T2 fat-sat · axial · B · 4.0mm · 2.12mm/px · z∈[-0,+175]mm · 3 of 36 slices shown (1 of 4)]
[im 1/36]
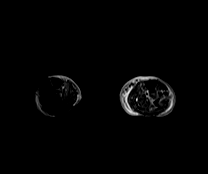
[im 18/36]
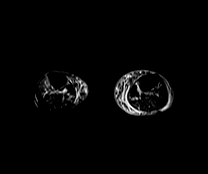
[im 36/36]
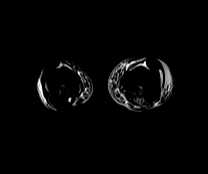

[Series 10: T2 fat-sat · axial · B · 4.0mm · 2.12mm/px · z∈[-240,+15]mm · 4 of 52 slices shown (2 of 4)]
[im 1/52]
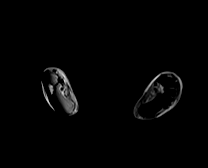
[im 18/52]
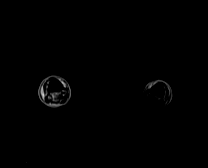
[im 35/52]
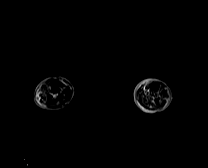
[im 52/52]
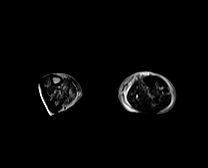

[Series 11: T2 · axial · B · 4.0mm · 2.12mm/px · z∈[-240,+175]mm · 7 of 84 slices shown]
[im 1/84]
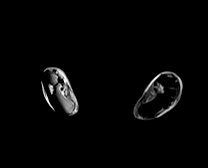
[im 14/84]
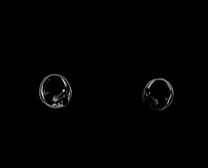
[im 28/84]
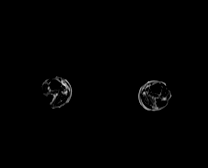
[im 42/84]
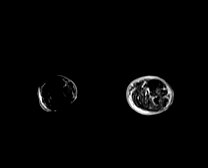
[im 56/84]
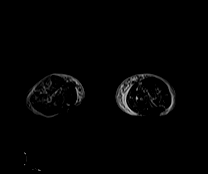
[im 70/84]
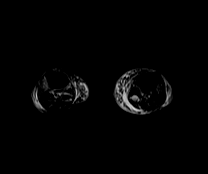
[im 84/84]
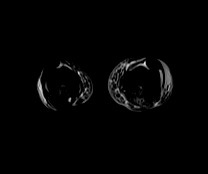

[Series 12: T2 fat-sat · sagittal · B · 4.0mm · 1.73mm/px · 2 of 28 slices shown (3 of 4)]
[im 1/28]
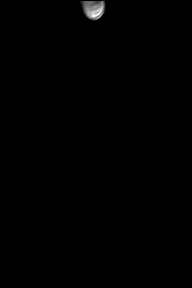
[im 28/28]
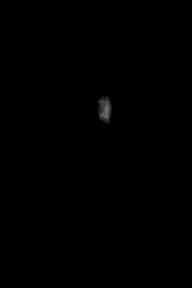

[Series 13: T2 fat-sat · sagittal · B · 4.0mm · 1.73mm/px · 2 of 26 slices shown (4 of 4)]
[im 1/26]
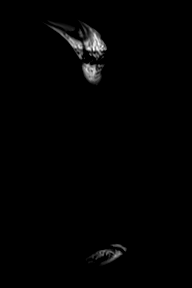
[im 26/26]
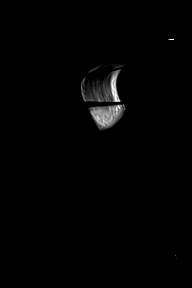

[40 of 40 positions shown; findings below may reference images not displayed]

FINDINGS: Bones/Joint/Cartilage

No suspicious marrow signal abnormality. No fracture or dislocation.
Small right and tiny Eulalie Pires cysts. No joint effusion.

Muscles and Tendons
No muscle edema. Mild atrophy of the right medial gastrocnemius
muscle. Visualized bilateral flexor, extensor, peroneal, and
Achilles tendons are intact.

Soft tissue
Skin irregularity along the anterior aspect of both distal lower
legs, likely corresponding to the patient's wounds. Circumferential
soft tissue swelling of both lower legs, worse on the left. No fluid
collection or hematoma. No soft tissue mass.
IMPRESSION: 1. Skin irregularity along the anterior aspect of both distal lower
legs, likely corresponding to the patient's wounds. No evidence of
osteomyelitis or abscess.
2. Circumferential soft tissue swelling of both lower legs, worse on
the left. This can be seen with cellulitis, venous insufficiency, or
third spacing.

## 2022-05-26 IMAGING — US US RENAL
1 series · 14 of 25 positions shown · non-contrast
Comparison: CT 08/25/2013.

CLINICAL DATA: Acute renal failure.

EXAM:
RENAL / URINARY TRACT ULTRASOUND COMPLETE

[Series 1: us renal · 14 of 52 slices shown]
[im 1/52]
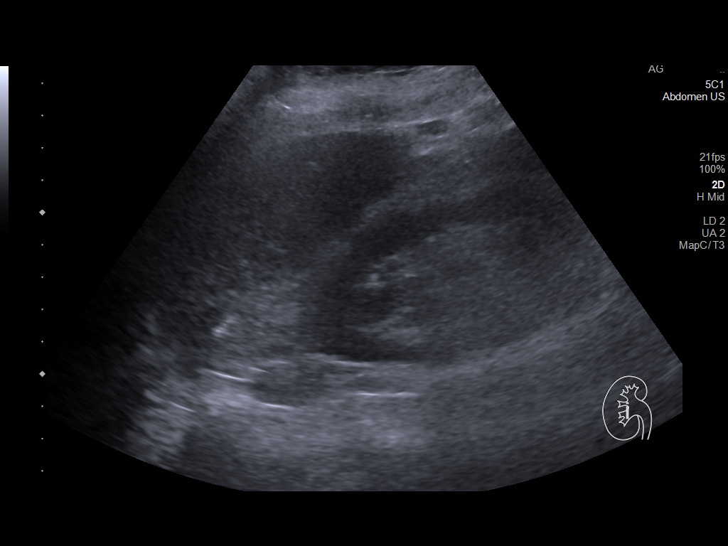
[im 5/52]
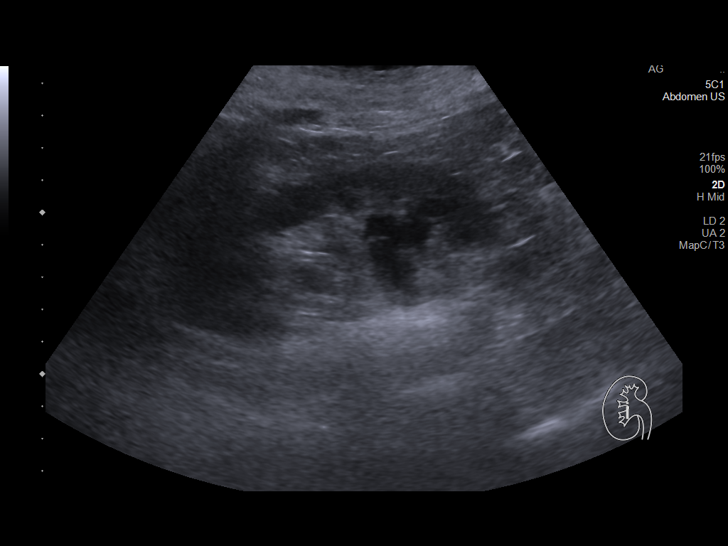
[im 9/52]
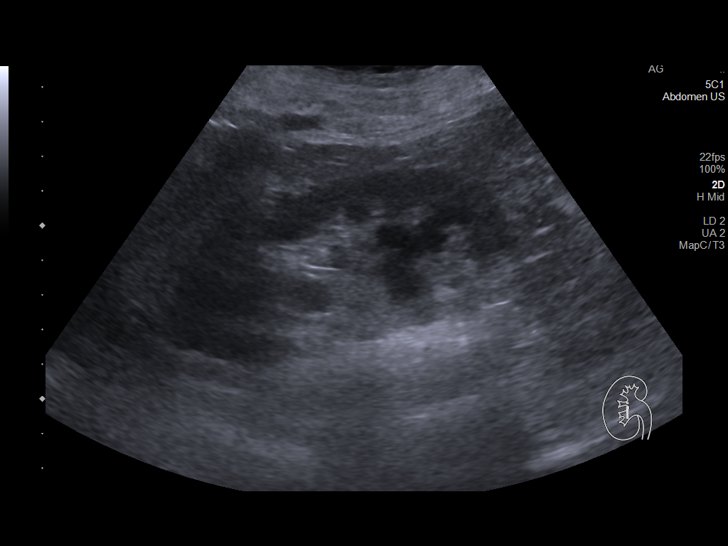
[im 13/52]
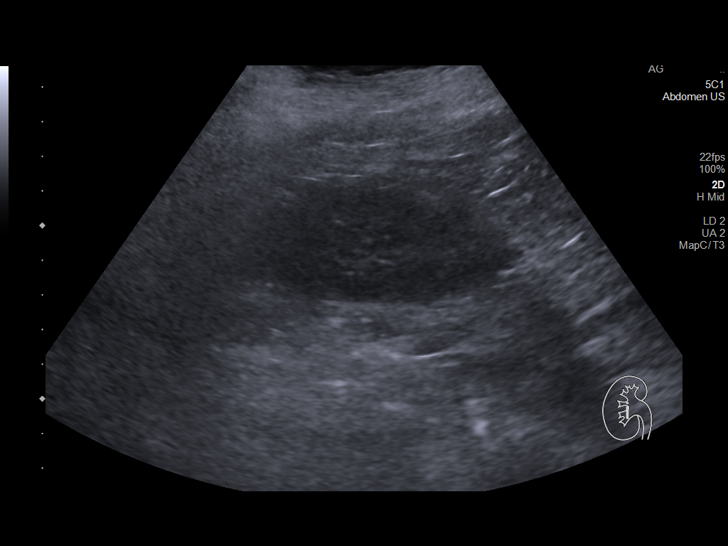
[im 18/52]
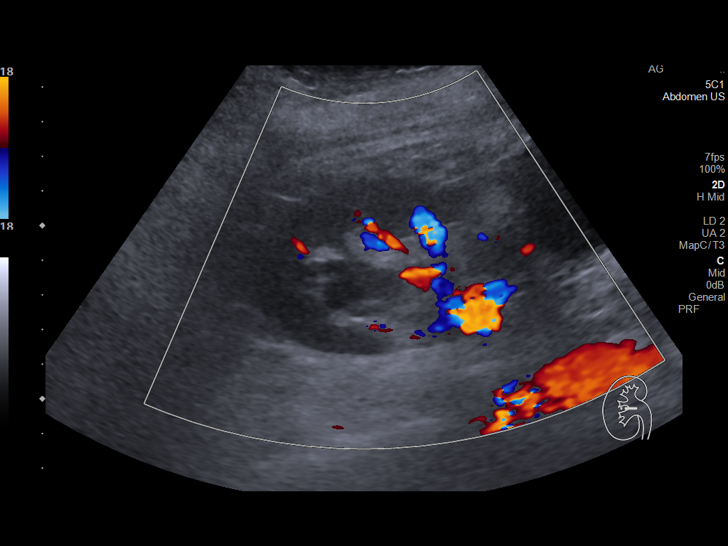
[im 20/52]
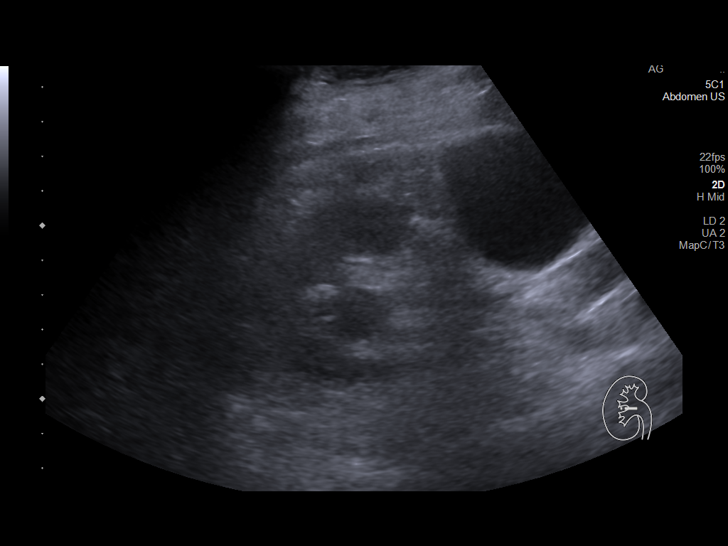
[im 24/52]
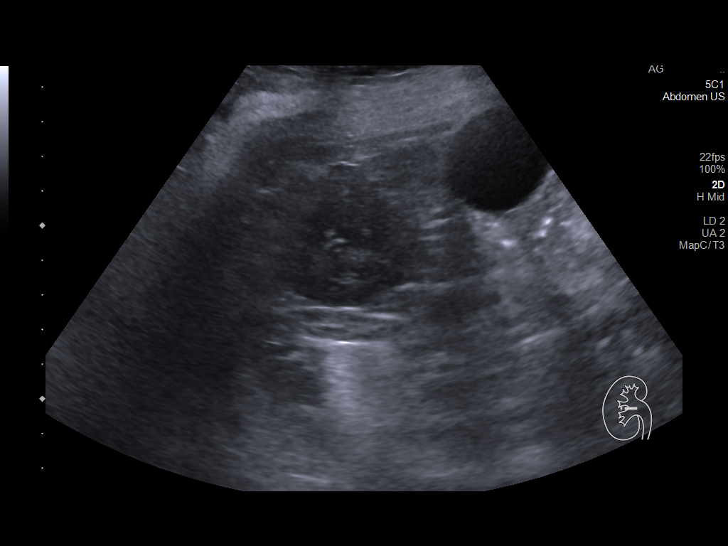
[im 28/52]
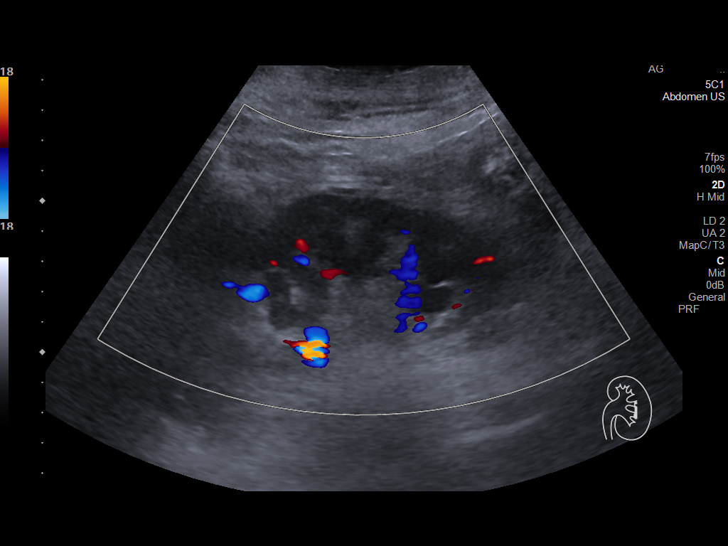
[im 32/52]
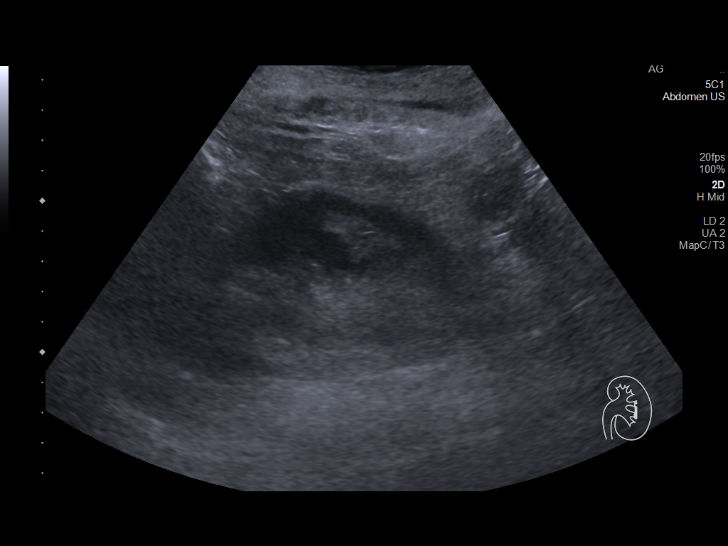
[im 35/52]
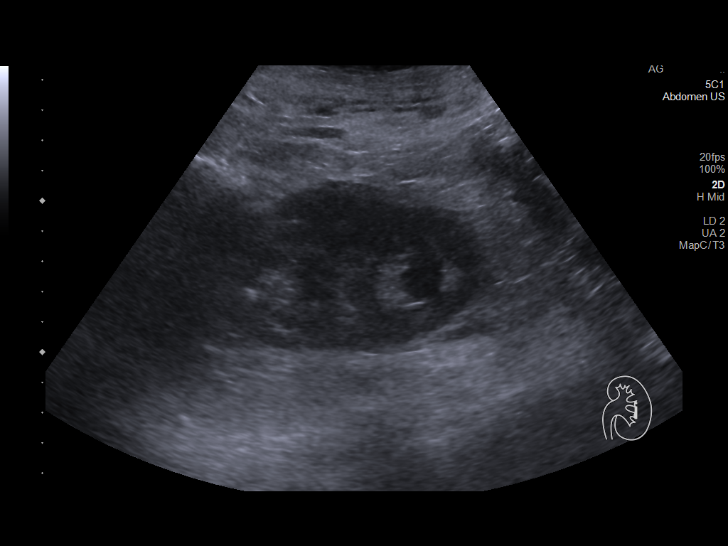
[im 39/52]
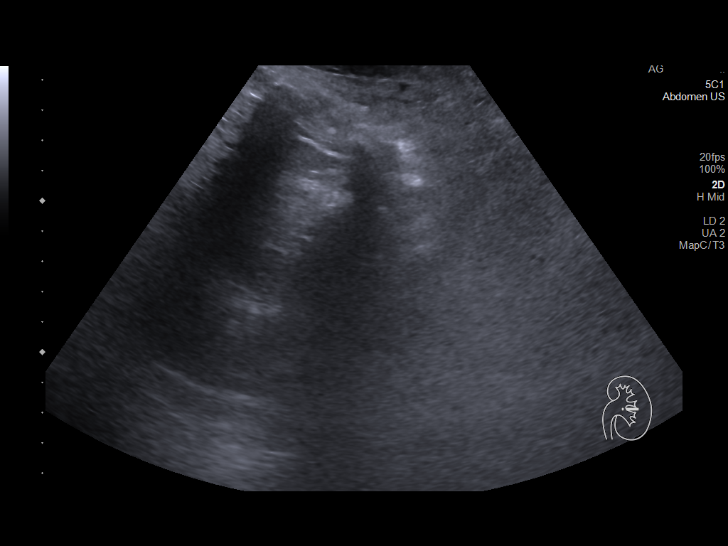
[im 43/52]
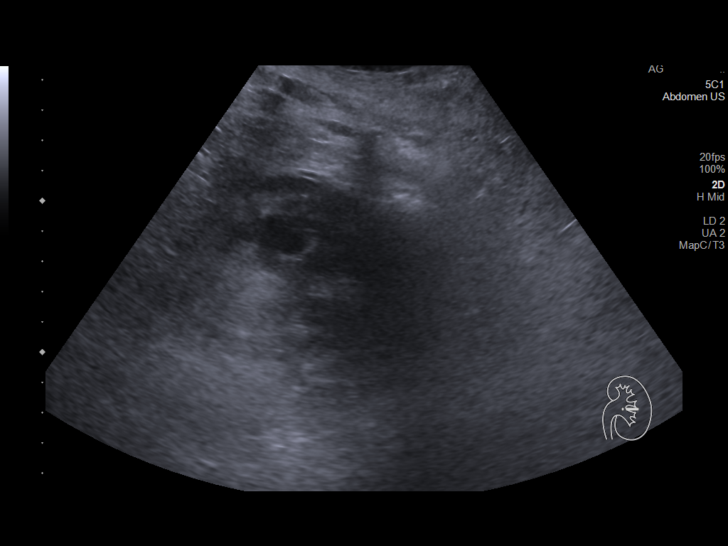
[im 47/52]
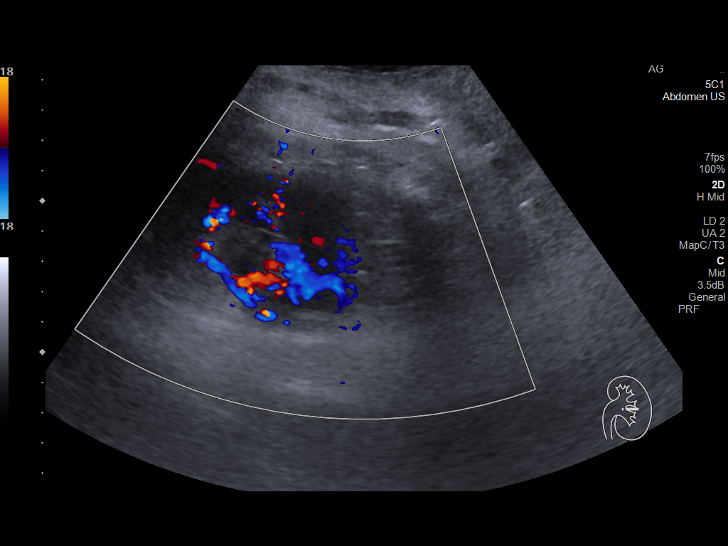
[im 52/52]
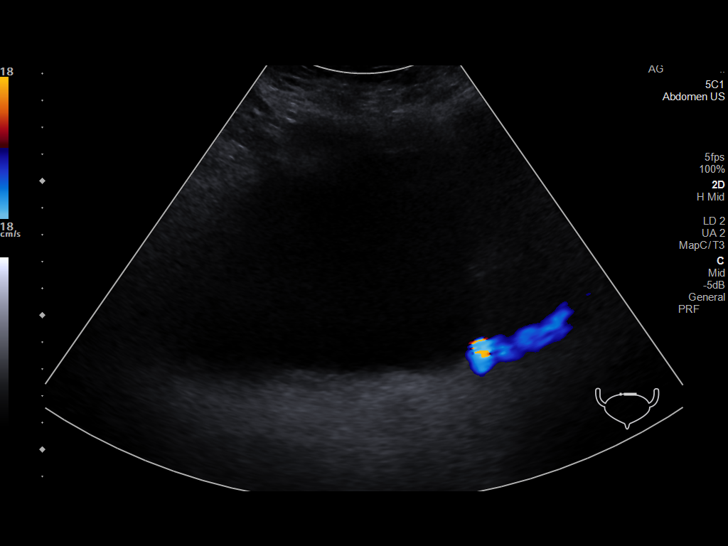

[14 of 25 positions shown; findings below may reference images not displayed]

FINDINGS: Right Kidney:

Renal measurements: 11.3 x 5.0 x 6.2 cm = volume: 182.6 mL.
Echogenicity within normal limits. No mass. Mild right
hydronephrosis visualized.

Left Kidney:

Renal measurements: 11.9 x 6.4 x 6.6 cm = volume: 263.0 mL.
Echogenicity within normal limits. No mass. Moderate left
hydronephrosis visualized.

Bladder:

Mild bladder distention cannot be excluded.

Other:

None.
IMPRESSION: 1. Mild right and moderate left hydronephrosis.
2. Mild bladder distention cannot be excluded.
